# Patient Record
Sex: Female | Born: 1975 | Race: Black or African American | Hispanic: No | Smoking: Never smoker
Health system: Southern US, Community
[De-identification: ages and names within clinical notes are randomized; demographics above are authoritative.]

## PROBLEM LIST (undated history)

## (undated) DIAGNOSIS — R87629 Unspecified abnormal cytological findings in specimens from vagina: Secondary | ICD-10-CM

## (undated) DIAGNOSIS — N63 Unspecified lump in unspecified breast: Secondary | ICD-10-CM

## (undated) DIAGNOSIS — D509 Iron deficiency anemia, unspecified: Secondary | ICD-10-CM

## (undated) HISTORY — DX: Unspecified lump in unspecified breast: N63.0

## (undated) HISTORY — DX: Unspecified abnormal cytological findings in specimens from vagina: R87.629

## (undated) HISTORY — DX: Iron deficiency anemia, unspecified: D50.9

---

## 2000-07-18 HISTORY — PX: LEEP: SHX91

## 2014-12-01 ENCOUNTER — Other Ambulatory Visit: Payer: Self-pay | Admitting: Obstetrics and Gynecology

## 2014-12-02 ENCOUNTER — Other Ambulatory Visit: Payer: Self-pay | Admitting: Obstetrics and Gynecology

## 2014-12-02 DIAGNOSIS — N63 Unspecified lump in unspecified breast: Secondary | ICD-10-CM

## 2014-12-05 ENCOUNTER — Ambulatory Visit
Admission: RE | Admit: 2014-12-05 | Discharge: 2014-12-05 | Disposition: A | Discharge status: Home / Self Care | Payer: PRIVATE HEALTH INSURANCE | Source: Ambulatory Visit | Attending: Obstetrics and Gynecology | Admitting: Obstetrics and Gynecology

## 2014-12-05 DIAGNOSIS — N63 Unspecified lump in unspecified breast: Secondary | ICD-10-CM

## 2015-01-27 ENCOUNTER — Encounter: Payer: Self-pay | Admitting: *Deleted

## 2015-01-28 ENCOUNTER — Ambulatory Visit (INDEPENDENT_AMBULATORY_CARE_PROVIDER_SITE_OTHER): Payer: PRIVATE HEALTH INSURANCE | Admitting: Obstetrics and Gynecology

## 2015-01-28 ENCOUNTER — Encounter: Payer: Self-pay | Admitting: Obstetrics and Gynecology

## 2015-01-28 VITALS — BP 126/82 | HR 77 | Ht 64.0 in | Wt 145.0 lb

## 2015-01-28 DIAGNOSIS — N921 Excessive and frequent menstruation with irregular cycle: Secondary | ICD-10-CM | POA: Diagnosis not present

## 2015-01-28 DIAGNOSIS — Z975 Presence of (intrauterine) contraceptive device: Principal | ICD-10-CM

## 2015-01-28 NOTE — Progress Notes (Signed)
Subjective:     Patient ID: Nicole Roman, female   DOB: 1975-10-08, 39 y.o.   MRN: [id number]  HPI Mirena inserted 8 weeks ago without difficulty, has had daily bleeding since, changing panty liner at least twice a day. Denies pain or increased bleeding with sex; and denies any 'menstrual-like' bleeding.  Review of Systems See HPI    Objective:   Physical Exam A&O x 4 Well groomed lean AA female Pelvic exam: normal external genitalia, vulva, vagina, cervix, uterus and adnexa. Dark red scant blood noted at cervix with IUD string noted.    Assessment:     Breakthrough bleeding with Mirena     Plan:     Will continue with Mirena use, 1 pack LoLoEstrin given- to take 1 pill daily until bleeding stops.  RTC prn  Melody Trudee Kuster, CNM

## 2015-02-25 ENCOUNTER — Ambulatory Visit
Admission: RE | Admit: 2015-02-25 | Discharge: 2015-02-25 | Disposition: A | Discharge status: Home / Self Care | Payer: PRIVATE HEALTH INSURANCE | Source: Ambulatory Visit | Attending: Physician Assistant | Admitting: Physician Assistant

## 2015-02-25 ENCOUNTER — Other Ambulatory Visit: Payer: Self-pay | Admitting: Physician Assistant

## 2015-02-25 DIAGNOSIS — R7611 Nonspecific reaction to tuberculin skin test without active tuberculosis: Secondary | ICD-10-CM

## 2015-03-06 ENCOUNTER — Telehealth: Payer: Self-pay | Admitting: Family Medicine

## 2015-03-06 NOTE — Telephone Encounter (Signed)
Wants to [facility] the last time she had a ADDS TEST and others

## 2015-03-06 NOTE — Telephone Encounter (Signed)
She needs to have a Quantiferon Gold test, I will order it

## 2015-03-06 NOTE — Telephone Encounter (Signed)
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  The health department is wanting her to come in for screenings, but patient explained to them that because of where she grew up her test results always come back like this.  Pt is wanting Dr. Ancil Boozer advice as to what she should do.

## 2015-03-06 NOTE — Telephone Encounter (Signed)
She should follow up with the health department or come in, they may need to do a test called quantiferon gold. 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

## 2015-05-20 ENCOUNTER — Other Ambulatory Visit: Payer: Self-pay | Admitting: Family Medicine

## 2015-05-21 LAB — HEPATITIS B SURFACE ANTIBODY,QUALITATIVE: HEP B SURFACE AB, QUAL: NONREACTIVE

## 2015-05-21 LAB — CMP12+LP+TP+TSH+6AC+CBC/D/PLT
A/G RATIO: 1.6 (ref 1.1–2.5)
ALK PHOS: 92 IU/L (ref 39–117)
ALT: 6 IU/L (ref 0–32)
AST: 18 IU/L (ref 0–40)
Albumin: 4.3 g/dL (ref 3.5–5.5)
BUN / CREAT RATIO: 20 (ref 9–23)
BUN: 12 mg/dL (ref 6–24)
Basophils Absolute: 0 10*3/uL (ref 0.0–0.2)
Basos: 0 %
Bilirubin Total: 0.8 mg/dL (ref 0.0–1.2)
Calcium: 9.8 mg/dL (ref 8.7–10.2)
Chloride: 101 mmol/L (ref 97–106)
Chol/HDL Ratio: 3.2 ratio units (ref 0.0–4.4)
Cholesterol, Total: 155 mg/dL (ref 100–199)
Creatinine, Ser: 0.6 mg/dL (ref 0.57–1.00)
EOS (ABSOLUTE): 0.1 10*3/uL (ref 0.0–0.4)
EOS: 1 %
Estimated CHD Risk: <0.5 times avg. (ref 0.0–1.0)
Free Thyroxine Index: 2 (ref 1.2–4.9)
GFR calc Af Amer: 132 mL/min/{1.73_m2} (ref >59)
GFR calc non Af Amer: 114 mL/min/{1.73_m2} (ref >59)
GGT: 18 IU/L (ref 0–60)
Globulin, Total: 2.7 g/dL (ref 1.5–4.5)
Glucose: 85 mg/dL (ref 65–99)
HDL: 48 mg/dL (ref >39)
Hematocrit: 37.5 % (ref 34.0–46.6)
Hemoglobin: 12.1 g/dL (ref 11.1–15.9)
Immature Grans (Abs): 0 10*3/uL (ref 0.0–0.1)
Immature Granulocytes: 0 %
Iron: 55 ug/dL (ref 27–159)
LDH: 115 IU/L — AB (ref 119–226)
LDL Calculated: 99 mg/dL (ref 0–99)
Lymphocytes Absolute: 2.4 10*3/uL (ref 0.7–3.1)
Lymphs: 38 %
MCH: 28.7 pg (ref 26.6–33.0)
MCHC: 32.3 g/dL (ref 31.5–35.7)
MCV: 89 fL (ref 79–97)
MONOCYTES: 6 %
Monocytes Absolute: 0.4 10*3/uL (ref 0.1–0.9)
NEUTROS ABS: 3.3 10*3/uL (ref 1.4–7.0)
Neutrophils: 55 %
PHOSPHORUS: 3.7 mg/dL (ref 2.5–4.5)
POTASSIUM: 4.4 mmol/L (ref 3.5–5.2)
Platelets: 205 10*3/uL (ref 150–379)
RBC: 4.21 x10E6/uL (ref 3.77–5.28)
RDW: 14.2 % (ref 12.3–15.4)
Sodium: 138 mmol/L (ref 136–144)
T3 Uptake Ratio: 30 % (ref 24–39)
T4, Total: 6.6 ug/dL (ref 4.5–12.0)
TSH: 0.667 u[IU]/mL (ref 0.450–4.500)
Total Protein: 7 g/dL (ref 6.0–8.5)
Triglycerides: 41 mg/dL (ref 0–149)
URIC ACID: 4 mg/dL (ref 2.5–7.1)
VLDL Cholesterol Cal: 8 mg/dL (ref 5–40)
WBC: 6.2 10*3/uL (ref 3.4–10.8)

## 2015-07-15 ENCOUNTER — Ambulatory Visit: Payer: Self-pay | Admitting: Physician Assistant

## 2015-07-15 ENCOUNTER — Encounter: Payer: Self-pay | Admitting: Physician Assistant

## 2015-07-15 VITALS — BP 120/85 | HR 85 | Temp 98.7°F

## 2015-07-15 DIAGNOSIS — W57XXXA Bitten or stung by nonvenomous insect and other nonvenomous arthropods, initial encounter: Secondary | ICD-10-CM

## 2015-07-15 DIAGNOSIS — L03113 Cellulitis of right upper limb: Secondary | ICD-10-CM

## 2015-07-15 MED ORDER — SULFAMETHOXAZOLE-TRIMETHOPRIM 800-160 MG PO TABS: 1.0000 | ORAL_TABLET | Freq: Two times a day (BID) | ORAL | Status: DC

## 2015-07-15 MED ORDER — METHYLPREDNISOLONE 4 MG PO TBPK: ORAL_TABLET | ORAL | Status: DC

## 2015-07-15 NOTE — Progress Notes (Signed)
S: c/o r hand swelling, was a little swollen last night, used hydrocortisone cream, hand was swollen this am she couldn't make a fist, its a better now but still red and swollen, no known injury  O: vitals wnl, nad, skin on r hand is warm to touch, reddened, with streak going up r arm, swelling at hand on dorsal surface, 2 ?bug bites, full rom, n/v intact  A: insect bite, cellulitis  P: septra ds 1 po bid, medrol dose pack, otc benadryl, ice, return if worsening or not better in few days

## 2015-08-05 ENCOUNTER — Encounter: Payer: Self-pay | Admitting: Physician Assistant

## 2015-08-05 ENCOUNTER — Ambulatory Visit: Payer: Self-pay | Admitting: Physician Assistant

## 2015-08-05 ENCOUNTER — Other Ambulatory Visit: Payer: Self-pay | Admitting: Physician Assistant

## 2015-08-05 VITALS — BP 110/60 | HR 81 | Temp 98.9°F

## 2015-08-05 DIAGNOSIS — L03114 Cellulitis of left upper limb: Secondary | ICD-10-CM

## 2015-08-05 MED ORDER — METHYLPREDNISOLONE 4 MG PO TBPK: ORAL_TABLET | ORAL | Status: DC

## 2015-08-05 MED ORDER — SULFAMETHOXAZOLE-TRIMETHOPRIM 800-160 MG PO TABS: 1.0000 | ORAL_TABLET | Freq: Two times a day (BID) | ORAL | Status: DC

## 2015-08-05 NOTE — Progress Notes (Signed)
S: c/o red swollen spots on left arm, shoulder , r side of neck, unsure what is causing it, the last time had one on her hand, took septra and it cleared up, denies any iv use or injury/puncture wound to arm/hand, no fever/chills, has been eating a lot of shrimp lately, denies any hx of food allergies, no new products/foods/meds; has finished the tb meds she was taking for + tb test with neg cxr  O: vitals wnl, nad, left arm with 3 large warm spots, one on left shoulder near clavicle, one on r side of neck along cervical chain, lungs c t a, cv rrr  A: hives/cellulitis  P: septra ds, allergen food panels, cbc

## 2015-08-05 NOTE — Progress Notes (Signed)
Patient ID: Nicole Roman, female   DOB: [birth year], 40 y.o.   MRN: [id number] Patient called and expressed that she wants to take the steroid pack.  She wants it sent to [facility] on [address].

## 2015-08-05 NOTE — Addendum Note (Signed)
Addended by: Versie Starks on: 08/05/2015 02:30 PM   Modules accepted: Orders

## 2015-08-08 LAB — CBC WITH DIFFERENTIAL/PLATELET
BASOS: 0 %
Basophils Absolute: 0 10*3/uL (ref 0.0–0.2)
EOS (ABSOLUTE): 0 10*3/uL (ref 0.0–0.4)
EOS: 0 %
HEMOGLOBIN: 12 g/dL (ref 11.1–15.9)
Hematocrit: 36.5 % (ref 34.0–46.6)
IMMATURE GRANULOCYTES: 0 %
Immature Grans (Abs): 0 10*3/uL (ref 0.0–0.1)
LYMPHS: 40 %
Lymphocytes Absolute: 2.4 10*3/uL (ref 0.7–3.1)
MCH: 30 pg (ref 26.6–33.0)
MCHC: 32.9 g/dL (ref 31.5–35.7)
MCV: 91 fL (ref 79–97)
MONOCYTES: 6 %
MONOS ABS: 0.3 10*3/uL (ref 0.1–0.9)
NEUTROS PCT: 54 %
Neutrophils Absolute: 3.1 10*3/uL (ref 1.4–7.0)
Platelets: 182 10*3/uL (ref 150–379)
RBC: 4 x10E6/uL (ref 3.77–5.28)
RDW: 13.3 % (ref 12.3–15.4)
WBC: 5.8 10*3/uL (ref 3.4–10.8)

## 2015-08-08 LAB — ALLERGEN PROFILE, MINI-PANEL
Alternaria Alternata IgE: <0.1 kU/L
Cat Dander IgE: 0.14 kU/L — AB
D Farinae IgE: <0.1 kU/L
Kentucky Bluegrass IgE: <0.1 kU/L
Mouse Urine IgE: <0.1 kU/L
Oak, White IgE: <0.1 kU/L
Ragweed, Short IgE: <0.1 kU/L

## 2015-08-08 LAB — ALLERGEN PROFILE, BASIC
Codfish IgE: <0.1 kU/L
Egg White IgE: 0.28 kU/L — AB
Milk IgE: 0.45 kU/L — AB
Peanut IgE: <0.1 kU/L
Soybean IgE: <0.1 kU/L

## 2015-08-12 LAB — FX02-IGE FOOD MIX (SEAFOODS): Food Mix (Seafoods) IgE: NEGATIVE

## 2015-08-12 LAB — SPECIMEN STATUS REPORT

## 2015-08-13 NOTE — Progress Notes (Signed)
I spoke with the patient about her lab results and she expressed understanding. 

## 2015-10-01 ENCOUNTER — Encounter: Payer: Self-pay | Admitting: Physician Assistant

## 2015-10-01 ENCOUNTER — Ambulatory Visit: Payer: Self-pay | Admitting: Physician Assistant

## 2015-10-01 VITALS — BP 110/70 | HR 75 | Temp 98.5°F

## 2015-10-01 DIAGNOSIS — J029 Acute pharyngitis, unspecified: Secondary | ICD-10-CM

## 2015-10-01 MED ORDER — AZITHROMYCIN 250 MG PO TABS: ORAL_TABLET | ORAL | Status: DC

## 2015-10-01 NOTE — Progress Notes (Signed)
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

## 2015-10-27 ENCOUNTER — Other Ambulatory Visit: Payer: Self-pay | Admitting: Family Medicine

## 2015-10-27 DIAGNOSIS — Z1231 Encounter for screening mammogram for malignant neoplasm of breast: Secondary | ICD-10-CM

## 2015-12-07 ENCOUNTER — Ambulatory Visit
Admission: RE | Admit: 2015-12-07 | Discharge: 2015-12-07 | Disposition: A | Discharge status: Home / Self Care | Payer: Managed Care, Other (non HMO) | Source: Ambulatory Visit | Attending: Family Medicine | Admitting: Family Medicine

## 2015-12-07 DIAGNOSIS — Z1231 Encounter for screening mammogram for malignant neoplasm of breast: Secondary | ICD-10-CM | POA: Insufficient documentation

## 2015-12-16 ENCOUNTER — Other Ambulatory Visit: Payer: Self-pay | Admitting: Family Medicine

## 2015-12-16 DIAGNOSIS — R920 Mammographic microcalcification found on diagnostic imaging of breast: Secondary | ICD-10-CM

## 2015-12-25 ENCOUNTER — Ambulatory Visit: Payer: Self-pay

## 2015-12-25 ENCOUNTER — Ambulatory Visit
Admission: RE | Admit: 2015-12-25 | Discharge: 2015-12-25 | Disposition: A | Discharge status: Home / Self Care | Payer: Managed Care, Other (non HMO) | Source: Ambulatory Visit | Attending: Family Medicine | Admitting: Family Medicine

## 2015-12-25 DIAGNOSIS — R920 Mammographic microcalcification found on diagnostic imaging of breast: Secondary | ICD-10-CM | POA: Insufficient documentation

## 2016-02-03 ENCOUNTER — Ambulatory Visit: Payer: Self-pay | Admitting: Physician Assistant

## 2016-02-03 ENCOUNTER — Encounter: Payer: Self-pay | Admitting: Physician Assistant

## 2016-02-03 VITALS — BP 120/70 | HR 77 | Temp 98.2°F

## 2016-02-03 DIAGNOSIS — R1030 Lower abdominal pain, unspecified: Secondary | ICD-10-CM

## 2016-02-03 LAB — POCT URINALYSIS DIPSTICK
Bilirubin, UA: NEGATIVE
Glucose, UA: NEGATIVE
Ketones, UA: NEGATIVE
Leukocytes, UA: NEGATIVE
NITRITE UA: NEGATIVE
PH UA: 5.5
PROTEIN UA: NEGATIVE
RBC UA: NEGATIVE
Spec Grav, UA: >=1.03
Urobilinogen, UA: 1

## 2016-02-03 MED ORDER — FLUCONAZOLE 150 MG PO TABS: 150.0000 mg | ORAL_TABLET | Freq: Once | ORAL | Status: DC

## 2016-02-03 NOTE — Progress Notes (Signed)
   Subjective:    Patient ID: Nicole Roman, female    DOB: [birth year], 40 y.o.   MRN: [id number]  HPI  Patient c/o pelvic pain, dysuria, and whitish vaginal discharge. Denies flank pain or fever. Patient has IUD.  Review of Systems    Negative except for compliant Objective:   Physical Exam Deferred vaginal exam. Dip UA unremarkable.       Assessment & Plan:Yeat infection  Trial of Difulcan. Advised follow up with personal GYN Dcotor for further evaluation/treatment.

## 2016-02-04 MED ORDER — SULFAMETHOXAZOLE-TRIMETHOPRIM 800-160 MG PO TABS: 1.0000 | ORAL_TABLET | Freq: Two times a day (BID) | ORAL | Status: DC

## 2016-02-04 NOTE — Addendum Note (Signed)
Addended by: Sable Feil on: 02/04/2016 02:02 PM   Modules accepted: Orders, Medications

## 2016-02-04 NOTE — Addendum Note (Signed)
Addended by: Sable Feil on: 02/04/2016 01:08 PM   Modules accepted: Orders

## 2016-02-08 ENCOUNTER — Ambulatory Visit: Payer: Self-pay | Admitting: Physician Assistant

## 2016-02-08 ENCOUNTER — Other Ambulatory Visit: Payer: Self-pay | Admitting: Family Medicine

## 2016-02-08 ENCOUNTER — Ambulatory Visit (INDEPENDENT_AMBULATORY_CARE_PROVIDER_SITE_OTHER): Payer: Managed Care, Other (non HMO) | Admitting: Family Medicine

## 2016-02-08 ENCOUNTER — Encounter: Payer: Self-pay | Admitting: Family Medicine

## 2016-02-08 VITALS — BP 130/76 | HR 95 | Temp 99.2°F | Resp 18 | Ht 64.0 in | Wt 144.2 lb

## 2016-02-08 DIAGNOSIS — N898 Other specified noninflammatory disorders of vagina: Secondary | ICD-10-CM

## 2016-02-08 DIAGNOSIS — Z8742 Personal history of other diseases of the female genital tract: Secondary | ICD-10-CM | POA: Insufficient documentation

## 2016-02-08 DIAGNOSIS — T3695XA Adverse effect of unspecified systemic antibiotic, initial encounter: Secondary | ICD-10-CM

## 2016-02-08 DIAGNOSIS — Z124 Encounter for screening for malignant neoplasm of cervix: Secondary | ICD-10-CM

## 2016-02-08 DIAGNOSIS — B379 Candidiasis, unspecified: Secondary | ICD-10-CM

## 2016-02-08 DIAGNOSIS — N841 Polyp of cervix uteri: Secondary | ICD-10-CM | POA: Diagnosis not present

## 2016-02-08 DIAGNOSIS — Z113 Encounter for screening for infections with a predominantly sexual mode of transmission: Secondary | ICD-10-CM

## 2016-02-08 DIAGNOSIS — Z30431 Encounter for routine checking of intrauterine contraceptive device: Secondary | ICD-10-CM | POA: Diagnosis not present

## 2016-02-08 DIAGNOSIS — E049 Nontoxic goiter, unspecified: Secondary | ICD-10-CM

## 2016-02-08 DIAGNOSIS — Z299 Encounter for prophylactic measures, unspecified: Secondary | ICD-10-CM

## 2016-02-08 LAB — HIV ANTIBODY (ROUTINE TESTING W REFLEX): HIV 1&2 Ab, 4th Generation: NONREACTIVE

## 2016-02-08 MED ORDER — METRONIDAZOLE 500 MG PO TABS: 500.0000 mg | ORAL_TABLET | Freq: Two times a day (BID) | ORAL | 0 refills | Status: DC

## 2016-02-08 MED ORDER — FLUCONAZOLE 150 MG PO TABS: 150.0000 mg | ORAL_TABLET | ORAL | 0 refills | Status: DC

## 2016-02-08 NOTE — Progress Notes (Signed)
Name: Nicole Roman   MRN: [id number]    DOB: 1975-12-22   Date:02/08/2016       Progress Note  Subjective  Chief Complaint  Chief Complaint  Patient presents with  . Vaginal Discharge    [facility] and was given Diflucan which resolved some of the discharge but not of all of it. Patient still has some of the fishy odor when having intercoarse and white mucus discharge.    HPI  Vagina discharge: started one week after her cycle. LMP was 01/24/2016 and a few days later she noticed white discharge, thick, and had cramping with intercourse. She states that all the symptoms resolved, except for odor after intercourse. No fever, chills, she has same sexual partner for the past 18 months, she does not use condoms. She has previous history of abnormal pap smear, had a LEEP in 2002  Past Surgical History:  Procedure Laterality Date  . LEEP  2002    Family History  Problem Relation Age of Onset  . Cancer Mother     colon  . Diabetes Father   . Breast cancer Neg Hx     Social History   Social History  . Marital status: Legally Separated    Spouse name: N/A  . Number of children: N/A  . Years of education: N/A   Occupational History  . Not on file.   Social History Main Topics  . Smoking status: Never Smoker  . Smokeless tobacco: Never Used  . Alcohol use Yes     Comment: occasionally  . Drug use: No  . Sexual activity: Yes    Birth control/ protection: IUD   Other Topics Concern  . Not on file   Social History Narrative  . No narrative on file     Current Outpatient Prescriptions:  .  levonorgestrel (MIRENA) 20 MCG/24HR IUD, 1 each by Intrauterine route once., Disp: , Rfl:  .  fluconazole (DIFLUCAN) 150 MG tablet, Take 1 tablet (150 mg total) by mouth every other day., Disp: 3 tablet, Rfl: 0 .  metroNIDAZOLE (FLAGYL) 500 MG tablet, Take 1 tablet (500 mg total) by mouth 2 (two) times daily., Disp: 14 tablet, Rfl: 0  No Known  Allergies   ROS  Ten systems reviewed and is negative except as mentioned in HPI   Objective  Vitals:   02/08/16 0956  BP: 130/76  Pulse: 95  Resp: 18  Temp: 99.2 F (37.3 C)  TempSrc: Oral  SpO2: 97%  Weight: 144 lb 3.2 oz (65.4 kg)  Height: 5\' 4"  (1.626 m)    Body mass index is 24.75 kg/m.  Physical Exam  Constitutional: Patient appears well-developed and well-nourished.  No distress.  HEENT: head atraumatic, normocephalic, pupils equal and reactive to light, neck supple, throat within normal limits, goiter ( refuses to go back to Endo) Cardiovascular: Normal rate, regular rhythm and normal heart sounds.  No murmur heard. No BLE edema. Pulmonary/Chest: Effort normal and breath sounds normal. No respiratory distress. Abdominal: Soft.  There is no tenderness. GYN: mild vaginal discharge, positive for fishy odor, wet smear not done, sending pap and treating empirically with antibiotics. Small cervical polyp, friable cervix . IUD strings not seen during exam, negative for bimanual motion tenderness or masses.  Psychiatric: Patient has a normal mood and affect. behavior is normal. Judgment and thought content normal.  Recent Results (from the past 2160 hour(s))  POCT Urinalysis Dipstick (CPT 81002)     Status: Normal  Collection Time: 02/03/16 10:40 AM  Result Value Ref Range   Color, UA Yellow    Clarity, UA Clear    Glucose, UA Negative    Bilirubin, UA Negative    Ketones, UA Negative    Spec Grav, UA >=1.030    Blood, UA Negative    pH, UA 5.5    Protein, UA Negative    Urobilinogen, UA 1.0    Nitrite, UA Negative    Leukocytes, UA Negative Negative       Assessment & Plan  1. Vaginal discharge  - metroNIDAZOLE (FLAGYL) 500 MG tablet; Take 1 tablet (500 mg total) by mouth 2 (two) times daily.  Dispense: 14 tablet; Refill: 0  2. Routine screening for STI (sexually transmitted infection)  - HIV antibody - RPR  3. Cervical cancer screening  - Pap  IG, CT/NG NAA, and HPV (high risk)  4. Antibiotic-induced yeast infection  - fluconazole (DIFLUCAN) 150 MG tablet; Take 1 tablet (150 mg total) by mouth every other day.  Dispense: 3 tablet; Refill: 0  1. Vaginal discharge  - metroNIDAZOLE (FLAGYL) 500 MG tablet; Take 1 tablet (500 mg total) by mouth 2 (two) times daily.  Dispense: 14 tablet; Refill: 0   5. History of abnormal cervical Pap smear   6. Cervical polyp  - Ambulatory referral to Gynecology  7. IUD check up  - Ambulatory referral to Gynecology  8. Goiter  Seen by [facility] in the past, states negative biopsy, but lost to follow up one year later, explained importance of going back.

## 2016-02-08 NOTE — Progress Notes (Signed)
Patient came in to update her Tetanus shot per Dr Tobie Poet orders.  Tdap was administered.

## 2016-02-09 ENCOUNTER — Encounter: Payer: Self-pay | Admitting: Family Medicine

## 2016-02-09 ENCOUNTER — Encounter: Payer: Self-pay | Admitting: Obstetrics and Gynecology

## 2016-02-09 ENCOUNTER — Ambulatory Visit (INDEPENDENT_AMBULATORY_CARE_PROVIDER_SITE_OTHER): Payer: Managed Care, Other (non HMO) | Admitting: Obstetrics and Gynecology

## 2016-02-09 ENCOUNTER — Other Ambulatory Visit (INDEPENDENT_AMBULATORY_CARE_PROVIDER_SITE_OTHER): Payer: Managed Care, Other (non HMO)

## 2016-02-09 VITALS — BP 124/72 | HR 73 | Ht 64.0 in | Wt 147.6 lb

## 2016-02-09 DIAGNOSIS — Z30431 Encounter for routine checking of intrauterine contraceptive device: Secondary | ICD-10-CM | POA: Diagnosis not present

## 2016-02-09 DIAGNOSIS — N83201 Unspecified ovarian cyst, right side: Secondary | ICD-10-CM | POA: Insufficient documentation

## 2016-02-09 LAB — RPR

## 2016-02-09 NOTE — Progress Notes (Signed)
S: sent from PCP for IUD check as string was not visible on AE visit. Denies concerns.  O: A&O x4 Well groomed female in no distress Blood pressure 124/72, pulse 73, height 5\' 4"  (1.626 m), weight 147 lb 9.6 oz (67 kg), last menstrual period 01/18/2016. Pelvic exam: normal external genitalia, vulva, vagina, cervix, uterus and adnexa, IUD string not visible. See u/s below:  Indications:IUD placement Findings:  The uterus measures 8.7 x 5.2 x 7cm. Echo texture is homogenous without evidence of focal masses.  The Endometrium measures 4.3 mm.  IUD appears to be in the correct location.  Right Ovary measures 3.7 x 2 x 2.2 cm. Complex cyst measures 1.9 x 1.4 x 2.2cm. Left Ovary measures 3.1 x 1.6 x 2.4 cm. It is normal appearance. Survey of the adnexa demonstrates no adnexal masses. There is a trace amount free fluid in the cul de sac.  Impression: 1. Complex right ovarian cyst. IUD in proper place.    P: Desires to continue with IUD use, understands will have to search for string when it is time to remove it at 5 years. RTC prn.  Melody [place], CNM

## 2016-02-11 ENCOUNTER — Encounter: Payer: Self-pay | Admitting: Family Medicine

## 2016-02-11 DIAGNOSIS — IMO0002 Reserved for concepts with insufficient information to code with codable children: Secondary | ICD-10-CM | POA: Insufficient documentation

## 2016-02-11 LAB — PAP IG, CT-NG NAA, HPV HIGH-RISK
Chlamydia Probe Amp: NOT DETECTED
GC PROBE AMP: NOT DETECTED
HPV DNA HIGH RISK: NOT DETECTED

## 2016-02-24 ENCOUNTER — Telehealth: Payer: Self-pay | Admitting: Obstetrics and Gynecology

## 2016-02-24 NOTE — Telephone Encounter (Signed)
PT CALLED AND SHE HAD AN [place] DUE TO [facility] NOT FINDING THE STRING, SO [facility] ORDERED AN [place] TO MAKE SURE IUD WAS LOCATED PROPERLY, AND HER INSURANCE IS DENYING THE [place] THEY TOLD HER THAT [facility] NEEDS TO PUT IN A CORRECT CLAIM WITH A DIFFERENT CODE. ARE YOU ABLE TO DO THIS OR DOES IT NEED TO GO TO [facility] FIRST? PT WOULD LIKE CALL BACK WHEN FINISHED FILING

## 2016-03-02 ENCOUNTER — Ambulatory Visit: Payer: PRIVATE HEALTH INSURANCE | Admitting: Obstetrics and Gynecology

## 2016-04-05 ENCOUNTER — Ambulatory Visit: Payer: Self-pay | Admitting: Physician Assistant

## 2016-04-05 ENCOUNTER — Encounter: Payer: Self-pay | Admitting: Physician Assistant

## 2016-04-05 VITALS — BP 120/80 | HR 85 | Temp 99.1°F

## 2016-04-05 DIAGNOSIS — L309 Dermatitis, unspecified: Secondary | ICD-10-CM

## 2016-04-05 NOTE — Progress Notes (Signed)
S: states her left ear has been itching, using q tips and h202, no drainage from ear, no pain, just itching, nurse at work said it looked red  O: vitals wnl, nad, skin in left ear canal appears dry, pale, irritated, no redness swelling or exudate, neck supple no lymph, lungs c t a, cv rrr  A: eczema of ear  P: otc coconut oil to area of dryness, if not better in 5-7 d will call in cortisporin otic drops

## 2016-04-21 ENCOUNTER — Encounter: Payer: Self-pay | Admitting: Physician Assistant

## 2016-04-21 ENCOUNTER — Ambulatory Visit: Payer: Self-pay | Admitting: Physician Assistant

## 2016-04-21 VITALS — BP 109/68 | HR 84 | Temp 98.8°F

## 2016-04-21 DIAGNOSIS — H9202 Otalgia, left ear: Secondary | ICD-10-CM

## 2016-04-21 NOTE — Progress Notes (Signed)
   Subjective:Left ear pain    Patient ID: Nicole Roman, female    DOB: 1976-07-05, 40 y.o.   MRN: [id number]  HPI Left ear pain for one month. Refractory to conservative OTC treatment. Denies hearing loss.Denies headache or vertigo. Described the pain as "achy".   Review of Systems Negative except for compliant.    Objective:   Physical Exam HEENT:  Patient left ear canal is moist with baby oil. No Cerumen. Papular lesion posterior auricle.Neck supple without adenopathy. Lungs CTA, and Heart RRR.       Assessment & Plan:Otalgia left ear.  Conuslt to ENT for further evaluation and treatment.

## 2016-04-22 NOTE — Progress Notes (Signed)
Patient has been referred to [facility] to see Dr. Tami Ribas on 04/25/2016 at 2:30pm.  Patient has been notified.

## 2016-08-31 ENCOUNTER — Ambulatory Visit: Payer: Self-pay | Admitting: Physician Assistant

## 2016-08-31 ENCOUNTER — Encounter: Payer: Self-pay | Admitting: Physician Assistant

## 2016-08-31 VITALS — BP 135/76 | HR 74 | Temp 98.7°F | Ht 64.0 in | Wt 148.0 lb

## 2016-08-31 DIAGNOSIS — Z0000 Encounter for general adult medical examination without abnormal findings: Secondary | ICD-10-CM

## 2016-08-31 DIAGNOSIS — Z008 Encounter for other general examination: Secondary | ICD-10-CM

## 2016-08-31 DIAGNOSIS — Z0189 Encounter for other specified special examinations: Secondary | ICD-10-CM

## 2016-08-31 NOTE — Progress Notes (Signed)
S: here for biometrics and physical, no complaints, hx of anemia, takes iron supplements, nonsmoker, no drugs, occas etoh; fam hx dm, htn, colon CA; both parents still living; ros neg  O: vitals wnl, nad, ent wnl, neck supple no lymph, lungs c t a, cv rrr, abd soft nontender bs normal all 4 quads  A: well adult  P: labs today, exec panel, vit d, b12, ferritin, hiv, hep c; pt had mammogram in ?October, will let us know when she gets reminder for order to be placed

## 2016-08-31 NOTE — Progress Notes (Signed)
135/76  

## 2016-09-01 ENCOUNTER — Other Ambulatory Visit: Payer: Self-pay | Admitting: Physician Assistant

## 2016-09-01 LAB — CMP12+LP+TP+TSH+6AC+CBC/D/PLT
ALT: 8 IU/L (ref 0–32)
AST: 15 IU/L (ref 0–40)
Albumin/Globulin Ratio: 1.6 (ref 1.2–2.2)
Albumin: 4.5 g/dL (ref 3.5–5.5)
Alkaline Phosphatase: 88 IU/L (ref 39–117)
BASOS ABS: 0 10*3/uL (ref 0.0–0.2)
BUN/Creatinine Ratio: 14 (ref 9–23)
BUN: 8 mg/dL (ref 6–24)
Basos: 0 %
Bilirubin Total: 0.9 mg/dL (ref 0.0–1.2)
CHLORIDE: 100 mmol/L (ref 96–106)
CHOLESTEROL TOTAL: 150 mg/dL (ref 100–199)
Calcium: 9.6 mg/dL (ref 8.7–10.2)
Chol/HDL Ratio: 3.3 ratio units (ref 0.0–4.4)
Creatinine, Ser: 0.58 mg/dL (ref 0.57–1.00)
EOS (ABSOLUTE): 0.1 10*3/uL (ref 0.0–0.4)
Eos: 1 %
FREE THYROXINE INDEX: 2.2 (ref 1.2–4.9)
GFR calc Af Amer: 132 mL/min/{1.73_m2} (ref >59)
GFR, EST NON AFRICAN AMERICAN: 115 mL/min/{1.73_m2} (ref >59)
GGT: 25 IU/L (ref 0–60)
Globulin, Total: 2.8 g/dL (ref 1.5–4.5)
Glucose: 94 mg/dL (ref 65–99)
HDL: 45 mg/dL (ref >39)
HEMATOCRIT: 38.3 % (ref 34.0–46.6)
Hemoglobin: 12.5 g/dL (ref 11.1–15.9)
Immature Grans (Abs): 0 10*3/uL (ref 0.0–0.1)
Immature Granulocytes: 0 %
Iron: 68 ug/dL (ref 27–159)
LDH: 141 IU/L (ref 119–226)
LDL CALC: 99 mg/dL (ref 0–99)
Lymphocytes Absolute: 2.5 10*3/uL (ref 0.7–3.1)
Lymphs: 47 %
MCH: 28.6 pg (ref 26.6–33.0)
MCHC: 32.6 g/dL (ref 31.5–35.7)
MCV: 88 fL (ref 79–97)
MONOS ABS: 0.3 10*3/uL (ref 0.1–0.9)
Monocytes: 6 %
NEUTROS PCT: 46 %
Neutrophils Absolute: 2.4 10*3/uL (ref 1.4–7.0)
POTASSIUM: 4.3 mmol/L (ref 3.5–5.2)
Phosphorus: 3.3 mg/dL (ref 2.5–4.5)
Platelets: 233 10*3/uL (ref 150–379)
RBC: 4.37 x10E6/uL (ref 3.77–5.28)
RDW: 13.8 % (ref 12.3–15.4)
Sodium: 141 mmol/L (ref 134–144)
T3 Uptake Ratio: 29 % (ref 24–39)
T4, Total: 7.5 ug/dL (ref 4.5–12.0)
TSH: 1.15 u[IU]/mL (ref 0.450–4.500)
Total Protein: 7.3 g/dL (ref 6.0–8.5)
Triglycerides: 32 mg/dL (ref 0–149)
Uric Acid: 2.7 mg/dL (ref 2.5–7.1)
VLDL CHOLESTEROL CAL: 6 mg/dL (ref 5–40)
WBC: 5.3 10*3/uL (ref 3.4–10.8)

## 2016-09-01 LAB — HIV ANTIBODY (ROUTINE TESTING W REFLEX): HIV Screen 4th Generation wRfx: NONREACTIVE

## 2016-09-01 LAB — FERRITIN: FERRITIN: 23 ng/mL (ref 15–150)

## 2016-09-01 LAB — VITAMIN D 25 HYDROXY (VIT D DEFICIENCY, FRACTURES): VIT D 25 HYDROXY: 31.1 ng/mL (ref 30.0–100.0)

## 2016-09-01 LAB — HCV COMMENT:

## 2016-09-01 LAB — HEPATITIS C ANTIBODY (REFLEX): HCV Ab: <0.1 s/co ratio (ref 0.0–0.9)

## 2016-09-01 LAB — B12 AND FOLATE PANEL: FOLATE: 14.9 ng/mL (ref >3.0)

## 2016-09-01 MED ORDER — CYANOCOBALAMIN 1000 MCG/ML IJ SOLN: 1000.0000 ug | INTRAMUSCULAR | 1 refills | Status: DC

## 2016-09-01 NOTE — Progress Notes (Signed)
rx for b12 injection sent to pharmacy, will do weekly injections x 4 weeks then monthly injections

## 2016-09-08 ENCOUNTER — Ambulatory Visit: Payer: Self-pay | Admitting: Physician Assistant

## 2016-09-08 DIAGNOSIS — E538 Deficiency of other specified B group vitamins: Secondary | ICD-10-CM

## 2016-09-08 MED ORDER — CYANOCOBALAMIN 1000 MCG/ML IJ SOLN
1000.0000 ug | Freq: Once | INTRAMUSCULAR | Status: AC
  Administered 2016-09-08: 1000 ug via INTRAMUSCULAR

## 2016-09-08 NOTE — Progress Notes (Signed)
Pt here for rma to give b12 injection

## 2016-09-16 ENCOUNTER — Ambulatory Visit: Payer: Self-pay | Admitting: Physician Assistant

## 2016-09-16 ENCOUNTER — Encounter: Payer: Self-pay | Admitting: Physician Assistant

## 2016-09-16 VITALS — BP 113/70 | HR 79 | Temp 99.6°F

## 2016-09-16 DIAGNOSIS — E538 Deficiency of other specified B group vitamins: Secondary | ICD-10-CM

## 2016-09-16 DIAGNOSIS — J069 Acute upper respiratory infection, unspecified: Secondary | ICD-10-CM

## 2016-09-16 LAB — POCT INFLUENZA A/B
INFLUENZA B, POC: NEGATIVE
Influenza A, POC: NEGATIVE

## 2016-09-16 MED ORDER — FEXOFENADINE-PSEUDOEPHED ER 60-120 MG PO TB12: 1.0000 | ORAL_TABLET | Freq: Two times a day (BID) | ORAL | 0 refills | Status: DC

## 2016-09-16 MED ORDER — CYANOCOBALAMIN 1000 MCG/ML IJ SOLN
1000.0000 ug | Freq: Once | INTRAMUSCULAR | Status: AC
  Administered 2016-09-16: 1000 ug via INTRAMUSCULAR

## 2016-09-16 NOTE — Progress Notes (Addendum)
   Subjective:Seasonal allergies    Patient ID: Nicole Roman, female    DOB: 12/20/1975, 41 y.o.   MRN: [id number]  HPI Patient c/o watery eyes, nasal congestion, intermitting running nose, and post nasal drainage for 2 days. Also states low grade fever. Patient has not take flu shot this season.  History of seasonal rhinitis and re-started her Flonase yesterday.   Review of Systems    Allergic rhinitis. Objective:   Physical Exam Bilateral edematous nasal turbinates with clear rhinorrhea. Post nasal drainage. Neck supple w/o adenopathy. Lungs CTA and Heart RRR.       Assessment & Plan:Seasonal rhinitis  Continue p[revious medication and start Allergra-D as directed.  Follow up with PCP.

## 2016-09-23 ENCOUNTER — Ambulatory Visit: Payer: Self-pay | Admitting: Physician Assistant

## 2016-09-23 DIAGNOSIS — E538 Deficiency of other specified B group vitamins: Secondary | ICD-10-CM

## 2016-09-23 MED ORDER — CYANOCOBALAMIN 1000 MCG/ML IJ SOLN
1000.0000 ug | Freq: Once | INTRAMUSCULAR | Status: AC
  Administered 2016-09-23: 1000 ug via INTRAMUSCULAR

## 2016-09-23 NOTE — Progress Notes (Signed)
Here for b12 injection

## 2016-09-30 ENCOUNTER — Ambulatory Visit: Payer: Self-pay | Admitting: Physician Assistant

## 2016-09-30 DIAGNOSIS — E538 Deficiency of other specified B group vitamins: Secondary | ICD-10-CM

## 2016-09-30 MED ORDER — CYANOCOBALAMIN 1000 MCG/ML IJ SOLN
1000.0000 ug | Freq: Once | INTRAMUSCULAR | Status: AC
  Administered 2016-09-30: 1000 ug via INTRAMUSCULAR

## 2016-09-30 NOTE — Progress Notes (Signed)
Patient came in to have her scheduled b12 injection per Susan's authorization.

## 2016-10-06 ENCOUNTER — Other Ambulatory Visit: Payer: Self-pay | Admitting: Physician Assistant

## 2016-10-06 DIAGNOSIS — E538 Deficiency of other specified B group vitamins: Secondary | ICD-10-CM

## 2016-10-07 LAB — VITAMIN B12: Vitamin B-12: 607 pg/mL (ref 232–1245)

## 2016-11-07 ENCOUNTER — Ambulatory Visit: Payer: Self-pay | Admitting: Physician Assistant

## 2016-11-07 DIAGNOSIS — E538 Deficiency of other specified B group vitamins: Secondary | ICD-10-CM

## 2016-11-07 MED ORDER — CYANOCOBALAMIN 1000 MCG/ML IJ SOLN
1000.0000 ug | Freq: Once | INTRAMUSCULAR | Status: AC
  Administered 2016-11-07: 1000 ug via INTRAMUSCULAR

## 2016-11-07 NOTE — Progress Notes (Signed)
Pt here to get b12 injection, given by RMA

## 2016-11-23 ENCOUNTER — Telehealth: Payer: Self-pay | Admitting: Emergency Medicine

## 2016-11-23 DIAGNOSIS — Z299 Encounter for prophylactic measures, unspecified: Secondary | ICD-10-CM

## 2016-11-23 NOTE — Telephone Encounter (Signed)
Patient called and requested an order for her screening mammogram.  Patient expressed that she is not having any issues.

## 2016-11-23 NOTE — Telephone Encounter (Signed)
Mammogram ordered for pt, she called and said its been a year and needs the order to get her mammogram, no problems or changes with her breasts

## 2016-12-05 ENCOUNTER — Ambulatory Visit: Payer: Self-pay | Admitting: Physician Assistant

## 2016-12-05 DIAGNOSIS — E538 Deficiency of other specified B group vitamins: Secondary | ICD-10-CM

## 2016-12-05 MED ORDER — CYANOCOBALAMIN 1000 MCG/ML IJ SOLN
1000.0000 ug | Freq: Once | INTRAMUSCULAR | Status: AC
  Administered 2016-12-05: 1000 ug via INTRAMUSCULAR

## 2016-12-05 NOTE — Progress Notes (Signed)
Patient came in to have her scheduled B12 injection.

## 2016-12-07 ENCOUNTER — Ambulatory Visit: Payer: Self-pay

## 2016-12-28 ENCOUNTER — Ambulatory Visit: Payer: Self-pay | Admitting: Physician Assistant

## 2016-12-28 ENCOUNTER — Ambulatory Visit
Admission: RE | Admit: 2016-12-28 | Discharge: 2016-12-28 | Disposition: A | Discharge status: Home / Self Care | Payer: Managed Care, Other (non HMO) | Source: Ambulatory Visit | Attending: Physician Assistant | Admitting: Physician Assistant

## 2016-12-28 DIAGNOSIS — E538 Deficiency of other specified B group vitamins: Secondary | ICD-10-CM

## 2016-12-28 DIAGNOSIS — Z1231 Encounter for screening mammogram for malignant neoplasm of breast: Secondary | ICD-10-CM | POA: Insufficient documentation

## 2016-12-28 DIAGNOSIS — Z299 Encounter for prophylactic measures, unspecified: Secondary | ICD-10-CM

## 2016-12-28 MED ORDER — CYANOCOBALAMIN 1000 MCG/ML IJ SOLN
1000.0000 ug | Freq: Once | INTRAMUSCULAR | Status: AC
  Administered 2016-12-28: 1000 ug via INTRAMUSCULAR

## 2016-12-28 NOTE — Progress Notes (Signed)
Pt here for b12 injection, seen by rma

## 2016-12-30 ENCOUNTER — Ambulatory Visit: Payer: Self-pay

## 2017-01-30 ENCOUNTER — Other Ambulatory Visit: Payer: Self-pay

## 2017-01-31 ENCOUNTER — Ambulatory Visit: Payer: Self-pay | Admitting: Physician Assistant

## 2017-01-31 DIAGNOSIS — Z299 Encounter for prophylactic measures, unspecified: Secondary | ICD-10-CM

## 2017-01-31 MED ORDER — CYANOCOBALAMIN 1000 MCG/ML IJ SOLN
1000.0000 ug | Freq: Once | INTRAMUSCULAR | Status: AC
  Administered 2017-01-31: 1000 ug via INTRAMUSCULAR

## 2017-01-31 NOTE — Progress Notes (Signed)
Patient came in to have blood drawn for testing and to received her scheduled monthly b12 injection.

## 2017-02-01 LAB — VITAMIN B12: Vitamin B-12: 235 pg/mL (ref 232–1245)

## 2017-02-17 ENCOUNTER — Ambulatory Visit: Payer: Self-pay | Admitting: Physician Assistant

## 2017-02-17 ENCOUNTER — Encounter: Payer: Self-pay | Admitting: Physician Assistant

## 2017-02-17 VITALS — BP 134/68 | HR 85 | Temp 98.5°F | Resp 16

## 2017-02-17 DIAGNOSIS — L578 Other skin changes due to chronic exposure to nonionizing radiation: Secondary | ICD-10-CM

## 2017-02-17 MED ORDER — DEXAMETHASONE SODIUM PHOSPHATE 10 MG/ML IJ SOLN
10.0000 mg | Freq: Once | INTRAMUSCULAR | Status: AC
  Administered 2017-02-17: 10 mg via INTRAMUSCULAR

## 2017-02-17 NOTE — Progress Notes (Signed)
S: c/o rash on arms and legs, went to [place] for a long weekend, came home and noticed rash, did not wear sunscreen, no rash on face bc she wore a large hat, no pustules or drainage, used hydrocortisone cream which helped a little  O: vitals wnl, nad, skin with small raised bumps on arms and legs, no pustules or drainage, no vesicles, no burrows, n/v intact  A: dermatitis secondary to sun poisoning  P: decadron 10mg  im, continue hydrocortisone cream

## 2017-02-27 ENCOUNTER — Ambulatory Visit: Payer: Self-pay | Admitting: Physician Assistant

## 2017-02-27 DIAGNOSIS — E538 Deficiency of other specified B group vitamins: Secondary | ICD-10-CM

## 2017-02-27 MED ORDER — CYANOCOBALAMIN 1000 MCG/ML IJ SOLN
1000.0000 ug | Freq: Once | INTRAMUSCULAR | Status: AC
  Administered 2017-02-27: 1000 ug via INTRAMUSCULAR

## 2017-02-27 NOTE — Progress Notes (Signed)
Pt here for b12 injection only

## 2017-02-27 NOTE — Progress Notes (Signed)
7262 Cyanocobalamin 1000mg /ml given in Left deltoid IM. Lot [id number] exp 06/2018 MWoodardRN

## 2017-03-02 ENCOUNTER — Ambulatory Visit: Payer: Self-pay

## 2017-03-03 ENCOUNTER — Ambulatory Visit (INDEPENDENT_AMBULATORY_CARE_PROVIDER_SITE_OTHER): Payer: Managed Care, Other (non HMO) | Admitting: Certified Nurse Midwife

## 2017-03-03 ENCOUNTER — Encounter: Payer: Self-pay | Admitting: Certified Nurse Midwife

## 2017-03-03 VITALS — BP 130/77 | HR 76 | Wt 152.9 lb

## 2017-03-03 DIAGNOSIS — Z01818 Encounter for other preprocedural examination: Secondary | ICD-10-CM | POA: Diagnosis not present

## 2017-03-03 NOTE — Patient Instructions (Signed)
Fibrocystic Breast Changes  Fibrocystic breast changes are changes that can make your breasts swollen or painful. These changes happen when tiny sacs of fluid (cysts) form in the breast. This is a common condition. It does not mean that you have cancer. It usually happens because of hormone changes during a monthly period.  Follow these instructions at home:   Check your breasts after every monthly period. If you do not have monthly periods, check your breasts on the first day of every month. Check for:  ? Soreness.  ? New swelling or puffiness.  ? A change in breast size.  ? A change in a lump that was already there.   Take over-the-counter and prescription medicines only as told by your doctor.   Wear a support or sports bra that fits well. Wear this support especially when you are exercising.   Avoid or have less caffeine, fat, and sugar in what you eat and drink as told by your doctor.  Contact a doctor if:   You have fluid coming from your nipple, especially if the fluid has blood in it.   You have new lumps or bumps in your breast.   Your breast gets puffy, red, and painful.   You have changes in how your breast looks.   Your nipple looks flat or it sinks into your breast.  Get help right away if:   Your breast turns red, and the redness is spreading.  Summary   Fibrocystic breast changes are changes that can make your breasts swollen or painful.   This condition can happen when you have hormone changes during your monthly period.   With this condition, it is important to check your breasts after every monthly period. If you do not have monthly periods, check your breasts on the first day of every month.  This information is not intended to replace advice given to you by your health care provider. Make sure you discuss any questions you have with your health care provider.  Document Released: 06/16/2008 Document Revised: 03/17/2016 Document Reviewed: 03/17/2016  Elsevier Interactive Patient  Education  2017 [employer] Inc[employer]

## 2017-03-06 ENCOUNTER — Encounter: Payer: Self-pay | Admitting: Physician Assistant

## 2017-03-06 ENCOUNTER — Ambulatory Visit: Payer: Self-pay | Admitting: Physician Assistant

## 2017-03-06 VITALS — BP 138/90 | HR 75 | Temp 98.4°F

## 2017-03-06 DIAGNOSIS — K219 Gastro-esophageal reflux disease without esophagitis: Secondary | ICD-10-CM

## 2017-03-06 MED ORDER — OMEPRAZOLE 20 MG PO CPDR: 20.0000 mg | DELAYED_RELEASE_CAPSULE | Freq: Every day | ORAL | 3 refills | Status: DC

## 2017-03-06 NOTE — Progress Notes (Signed)
S: pt states her chest hurts when she eats or drinks, its a burning sensation, no sob, no diaphoresis, no cardiac type chest pain, sx for 1-2 d, does eat a lot of spicy foods  O: vitals wnl, nad, lungs c t a, cv rrr  A: gerd  P: prilosec 20mg  qd, if not better in a week will refer to gi

## 2017-03-12 NOTE — Progress Notes (Signed)
GYN ENCOUNTER NOTE  Subjective:       Nicole Roman is a 41 y.o. [phone] female here for pre-op evaluation for upcoming "Mommy makeover".   Patient is heading to [facility] for breast implants and tummy tuck or liposuction. She is doing this to make "herself happy".   Surgeon requests assessment by provider due to history of fibrocystic breast changes.   Denies difficulty breathing or respiratory distress, chest pain, abdominal pain, vaginal bleeding, and leg pain or swelling.    Gynecologic History  No LMP recorded. Patient is not currently having periods (Reason: IUD).  Contraception: IUD  Last Pap: 2017. Results were: ASCUS-HPV negative  Last mammogram: 12/28/2016. Results were: normal  Obstetric History  OB History  Gravida Para Term Preterm AB Living  3       1 2   SAB TAB Ectopic Multiple Live Births  1       2    # Outcome Date GA Lbr Len/2nd Weight Sex Delivery Anes PTL Lv  3 Gravida 2004    F Vag-Spont   LIV  2 Gravida 1996    M Vag-Spont   LIV  1 SAB               Past Medical History:  Diagnosis Date  . Breast lump   . Iron deficiency anemia   . Vaginal Pap smear, abnormal     Past Surgical History:  Procedure Laterality Date  . LEEP  2002    Current Outpatient Prescriptions on File Prior to Visit  Medication Sig Dispense Refill  . cyanocobalamin (,VITAMIN B-12,) 1000 MCG/ML injection Inject 1 mL (1,000 mcg total) into the muscle once a week. For 4 weeks then will do monthly 16 mL 1  . fexofenadine-pseudoephedrine (ALLEGRA-D) 60-120 MG 12 hr tablet Take 1 tablet by mouth 2 (two) times daily. 20 tablet 0  . levonorgestrel (MIRENA) 20 MCG/24HR IUD 1 each by Intrauterine route once.     No current facility-administered medications on file prior to visit.     No Known Allergies  Social History   Social History  . Marital status: Legally Separated    Spouse name: N/A  . Number of children: N/A  . Years of education: N/A   Occupational History  . Not  on file.   Social History Main Topics  . Smoking status: Never Smoker  . Smokeless tobacco: Never Used  . Alcohol use Yes     Comment: occasionally  . Drug use: No  . Sexual activity: Yes    Birth control/ protection: IUD   Other Topics Concern  . Not on file   Social History Narrative  . No narrative on file    Family History  Problem Relation Age of Onset  . Cancer Mother        colon  . Hypertension Mother   . Diabetes Father   . Breast cancer Neg Hx     The following portions of the patient's history were reviewed and updated as appropriate: allergies, current medications, past family history, past medical history, past social history, past surgical history and problem list.  Review of Systems  Review of Systems - Negative except as noted above.  History obtained from the patient.   Objective:   BP 130/77   Pulse 76   Wt 152 lb 14.4 oz (69.4 kg)   BMI 26.25 kg/m    CONSTITUTIONAL: Well-developed, well-nourished female in no acute distress.   HENT:  Normocephalic, atraumatic.   NECK: Normal  range of motion, supple, no masses.  Normal thyroid.   SKIN: Skin is warm and dry. No rash noted. Not diaphoretic. No erythema. No pallor.  [place]: Alert and oriented to person, place, and time.   PSYCHIATRIC: Normal mood and affect. Normal behavior. Normal judgment and thought content.  CARDIOVASCULAR: normal rate and regular rhythm.  RESPIRATORY: Normal respiratory effort, chest expands symmetrically. Lungs are clear to auscultation, no crackles or wheezes.  BREASTS: breasts appear normal, no suspicious masses, no skin or nipple changes or axillary nodes; fibrocystic breast changes present  ABDOMEN: Soft, non distended; Non tender.  No Organomegaly.  PELVIC: Deferred  Assessment:   1. Pre-op evaluation  Plan:   Patient cleared for "Mommy makeover".   Reviewed red flag symptoms and when to call.   RTC as needed.    Diona Fanti, CNM

## 2017-03-16 ENCOUNTER — Telehealth: Payer: Self-pay | Admitting: Emergency Medicine

## 2017-03-16 MED ORDER — FLUCONAZOLE 150 MG PO TABS: 150.0000 mg | ORAL_TABLET | Freq: Once | ORAL | 0 refills | Status: AC

## 2017-03-16 NOTE — Telephone Encounter (Signed)
Patient called and expressed that she has developed a yeast infection.

## 2017-03-16 NOTE — Telephone Encounter (Signed)
Pt called and asked if we could call in something for yeast, approved only for this one time.  Sent rx to [facility] dr

## 2017-03-22 ENCOUNTER — Encounter: Payer: Self-pay | Admitting: Family Medicine

## 2017-03-22 ENCOUNTER — Ambulatory Visit (INDEPENDENT_AMBULATORY_CARE_PROVIDER_SITE_OTHER): Payer: Managed Care, Other (non HMO) | Admitting: Family Medicine

## 2017-03-22 VITALS — BP 118/68 | HR 111 | Temp 99.1°F | Resp 16 | Ht 64.0 in | Wt 149.2 lb

## 2017-03-22 DIAGNOSIS — Z23 Encounter for immunization: Secondary | ICD-10-CM | POA: Diagnosis not present

## 2017-03-22 DIAGNOSIS — E049 Nontoxic goiter, unspecified: Secondary | ICD-10-CM | POA: Diagnosis not present

## 2017-03-22 DIAGNOSIS — Z131 Encounter for screening for diabetes mellitus: Secondary | ICD-10-CM | POA: Diagnosis not present

## 2017-03-22 DIAGNOSIS — N898 Other specified noninflammatory disorders of vagina: Secondary | ICD-10-CM | POA: Diagnosis not present

## 2017-03-22 MED ORDER — METRONIDAZOLE 500 MG PO TABS: 2000.0000 mg | ORAL_TABLET | Freq: Every day | ORAL | 0 refills | Status: DC

## 2017-03-22 NOTE — Progress Notes (Signed)
Name: Nicole Roman   MRN: [id number]    DOB: March 21, 1976   Date:03/22/2017       Progress Note  Subjective  Chief Complaint  Chief Complaint  Patient presents with  . Vaginitis    white discharge for about a week, itchy no burning pt also stated that she would like urine preg done today pt stated that she went to her employee clinic and was given one dose treatment for yeast infection a week ago but still having sypmtoms                                                                                                     . Procedure    pt having a surgery 07/28/2017 and would like for you to be the physician she sees for her follow up care     HPI  ASCUS and cervical polyp : found on exam last year, seen by [facility] [facility], advised to follow up with them  Vaginitis: she has noticed some vaginal itching and mild white discharge, thick, and with some fishy odor after intercourse going on for about one week. She went to employee health and was given Diflucan but not any better, no pelvic pain, has IUD and only spots occasionally break through bleeding.   Tummy tuck and breast reduction: she is going to [place] in January, advised to have surgery locally, I do no recommend not being able to follow up with surgeon, she wants to go to [facility] because it is cheaper.    Patient Active Problem List   Diagnosis Date Noted  . ASCUS favor benign 02/11/2016  . Ovarian cyst, right 02/09/2016  . History of abnormal cervical Pap smear 02/08/2016  . Goiter 02/08/2016    Past Surgical History:  Procedure Laterality Date  . LEEP  2002    Family History  Problem Relation Age of Onset  . Cancer Mother        colon  . Hypertension Mother   . Diabetes Father   . Breast cancer Neg Hx     Social History   Social History  . Marital status: Legally Separated    Spouse name: N/A  . Number of children: N/A  . Years of education: N/A   Occupational History  . Not on file.   Social History Main Topics  .  Smoking status: Never Smoker  . Smokeless tobacco: Never Used  . Alcohol use Yes     Comment: occasionally  . Drug use: No  . Sexual activity: Yes    Birth control/ protection: IUD   Other Topics Concern  . Not on file   Social History Narrative  . No narrative on file     Current Outpatient Prescriptions:  .  cyanocobalamin (,VITAMIN B-12,) 1000 MCG/ML injection, Inject 1 mL (1,000 mcg total) into the muscle once a week. For 4 weeks then will do monthly, Disp: 16 mL, Rfl: 1 .  fexofenadine-pseudoephedrine (ALLEGRA-D) 60-120 MG 12 hr tablet, Take 1 tablet by mouth 2 (two) times daily., Disp: 20 tablet, Rfl: 0 .  levonorgestrel (MIRENA) 20  MCG/24HR IUD, 1 each by Intrauterine route once., Disp: , Rfl:  .  metroNIDAZOLE (FLAGYL) 500 MG tablet, Take 4 tablets (2,000 mg total) by mouth daily., Disp: 4 tablet, Rfl: 0 .  omeprazole (PRILOSEC) 20 MG capsule, Take 1 capsule (20 mg total) by mouth daily. (Patient not taking: Reported on 03/22/2017), Disp: 30 capsule, Rfl: 3  No Known Allergies   ROS  Constitutional: Negative for fever or weight change.  Respiratory: Negative for cough and shortness of breath.   Cardiovascular: Negative for chest pain ( resolved with gerd medication) no palpitations.  Gastrointestinal: Negative for abdominal pain, no bowel changes.  Musculoskeletal: Negative for gait problem or joint swelling.  Skin: Negative for rash.  Neurological: Negative for dizziness or headache.  No other specific complaints in a complete review of systems (except as listed in HPI above).  Objective  Vitals:   03/22/17 1400  BP: 118/68  Pulse: (!) 111  Resp: 16  Temp: 99.1 F (37.3 C)  SpO2: 97%  Weight: 149 lb 3 oz (67.7 kg)  Height: 5\' 4"  (1.626 m)    Body mass index is 25.61 kg/m.  Physical Exam  Constitutional: Patient appears well-developed and well-nourished.  No distress.  HEENT: head atraumatic, normocephalic, pupils equal and reactive to light,neck mild  goiter, throat within normal limits Cardiovascular: Normal rate, regular rhythm and normal heart sounds.  No murmur heard. No BLE edema. Pulmonary/Chest: Effort normal and breath sounds normal. No respiratory distress. Abdominal: Soft.  There is no tenderness. GYN: normal cervix, no polyp, mild white discharge - no odor, normal bimanual exam, specimen sent to lab Psychiatric: Patient has a normal mood and affect. behavior is normal. Judgment and thought content normal.  Recent Results (from the past 2160 hour(s))  Vitamin B12     Status: None   Collection Time: 01/31/17 12:00 AM  Result Value Ref Range   Vitamin B-12 235 232 - 1,245 pg/mL      PHQ2/9: Depression screen PHQ 2/9 03/22/2017  Decreased Interest 0  Down, Depressed, Hopeless 0  PHQ - 2 Score 0     Fall Risk: Fall Risk  03/22/2017  Falls in the past year? No     Assessment & Plan  1. Goiter  Explained importance of following up with endo  - Thyroid Panel With TSH - referral endo   2. Vaginal discharge  Seen by Employee clinic and given diflucan last week, but not better we will try flagyl and send wet prep to the labs - WET PREP BY MOLECULAR PROBE - metroNIDAZOLE (FLAGYL) 500 MG tablet; Take 4 tablets (2,000 mg total) by mouth daily.  Dispense: 4 tablet; Refill: 0  3. Needs flu shot  refused  4. Diabetes mellitus screening  - Hemoglobin A1c

## 2017-03-23 LAB — HEMOGLOBIN A1C
HEMOGLOBIN A1C: 5.2 %{Hb} (ref <5.7)
MEAN PLASMA GLUCOSE: 103 (calc)
eAG (mmol/L): 5.7 (calc)

## 2017-03-23 LAB — WET PREP BY MOLECULAR PROBE
CANDIDA SPECIES: NOT DETECTED
MICRO NUMBER:: 80973055
SPECIMEN QUALITY:: ADEQUATE
Trichomonas vaginosis: NOT DETECTED

## 2017-03-23 LAB — THYROID PANEL WITH TSH
FREE THYROXINE INDEX: 2.7 (ref 1.4–3.8)
T3 Uptake: 35 % (ref 22–35)
T4 TOTAL: 7.8 ug/dL (ref 5.1–11.9)
TSH: 0.84 mIU/L

## 2017-03-31 ENCOUNTER — Ambulatory Visit: Payer: Self-pay | Admitting: Physician Assistant

## 2017-04-03 ENCOUNTER — Ambulatory Visit: Payer: Self-pay | Admitting: Physician Assistant

## 2017-04-03 DIAGNOSIS — E538 Deficiency of other specified B group vitamins: Secondary | ICD-10-CM

## 2017-04-03 MED ORDER — CYANOCOBALAMIN 1000 MCG/ML IJ SOLN
1000.0000 ug | Freq: Once | INTRAMUSCULAR | Status: AC
  Administered 2017-04-03: 1000 ug via INTRAMUSCULAR

## 2017-04-03 NOTE — Progress Notes (Signed)
Patient came in to get her scheduled b12 injection.

## 2017-04-07 ENCOUNTER — Telehealth: Payer: Self-pay | Admitting: Family Medicine

## 2017-04-07 ENCOUNTER — Other Ambulatory Visit: Payer: Self-pay | Admitting: Family Medicine

## 2017-04-07 NOTE — Telephone Encounter (Signed)
DR SOLUM AT [facility] WANTS TO KNOW WHAT IT IS EXACTLY FOR THIS PATIENT SINCE HER LABS ARE NORMAL. IS THERE A SPECIFIC NEED SINCE THERE IS NO NEED TO TREAT JUST FOR [place]. PLEASE ADVISE. NATE AT [phone]

## 2017-04-07 NOTE — Telephone Encounter (Signed)
Okay to cancel if Dr. Gabriel Carina does not think it is necessary

## 2017-04-07 NOTE — Telephone Encounter (Signed)
Nicole Roman states Dr. Gabriel Carina would just cancel this appointment since she did not see the need for this appointment.

## 2017-05-04 ENCOUNTER — Ambulatory Visit: Payer: Self-pay | Admitting: Physician Assistant

## 2017-05-04 DIAGNOSIS — Z299 Encounter for prophylactic measures, unspecified: Secondary | ICD-10-CM

## 2017-05-04 MED ORDER — CYANOCOBALAMIN 1000 MCG/ML IJ SOLN
1000.0000 ug | Freq: Once | INTRAMUSCULAR | Status: AC
  Administered 2017-05-04: 1000 ug via INTRAMUSCULAR

## 2017-05-04 NOTE — Progress Notes (Signed)
Patient came in to have her scheduled Vitamin B12 injection.

## 2017-06-01 ENCOUNTER — Ambulatory Visit: Payer: Self-pay | Admitting: Physician Assistant

## 2017-06-01 DIAGNOSIS — E538 Deficiency of other specified B group vitamins: Secondary | ICD-10-CM

## 2017-06-01 MED ORDER — CYANOCOBALAMIN 1000 MCG/ML IJ SOLN
1000.0000 ug | Freq: Once | INTRAMUSCULAR | Status: AC
  Administered 2017-06-01: 1000 ug via INTRAMUSCULAR

## 2017-06-01 NOTE — Progress Notes (Signed)
Pt here for b12 injection

## 2017-06-02 LAB — CBC WITH DIFFERENTIAL/PLATELET
BASOS ABS: 0 10*3/uL (ref 0.0–0.2)
BASOS: 0 %
EOS (ABSOLUTE): 0 10*3/uL (ref 0.0–0.4)
EOS: 1 %
Hematocrit: 38.9 % (ref 34.0–46.6)
Hemoglobin: 12.7 g/dL (ref 11.1–15.9)
IMMATURE GRANULOCYTES: 0 %
Immature Grans (Abs): 0 10*3/uL (ref 0.0–0.1)
LYMPHS ABS: 2.4 10*3/uL (ref 0.7–3.1)
Lymphs: 45 %
MCH: 28.7 pg (ref 26.6–33.0)
MCHC: 32.6 g/dL (ref 31.5–35.7)
MCV: 88 fL (ref 79–97)
Monocytes Absolute: 0.3 10*3/uL (ref 0.1–0.9)
Monocytes: 5 %
NEUTROS ABS: 2.6 10*3/uL (ref 1.4–7.0)
Neutrophils: 49 %
PLATELETS: 238 10*3/uL (ref 150–379)
RBC: 4.42 x10E6/uL (ref 3.77–5.28)
RDW: 14.1 % (ref 12.3–15.4)
WBC: 5.3 10*3/uL (ref 3.4–10.8)

## 2017-06-02 LAB — IRON: IRON: 45 ug/dL (ref 27–159)

## 2017-06-02 LAB — B12 AND FOLATE PANEL: VITAMIN B 12: 242 pg/mL (ref 232–1245)

## 2017-06-29 ENCOUNTER — Ambulatory Visit: Payer: Self-pay

## 2017-06-30 ENCOUNTER — Encounter: Payer: Self-pay | Admitting: Family Medicine

## 2017-06-30 ENCOUNTER — Ambulatory Visit: Payer: Managed Care, Other (non HMO) | Admitting: Family Medicine

## 2017-06-30 ENCOUNTER — Ambulatory Visit: Payer: Self-pay | Admitting: Emergency Medicine

## 2017-06-30 VITALS — BP 120/84 | HR 75 | Resp 14 | Ht 64.0 in | Wt 151.9 lb

## 2017-06-30 DIAGNOSIS — R87610 Atypical squamous cells of undetermined significance on cytologic smear of cervix (ASC-US): Secondary | ICD-10-CM

## 2017-06-30 DIAGNOSIS — E538 Deficiency of other specified B group vitamins: Secondary | ICD-10-CM

## 2017-06-30 DIAGNOSIS — E049 Nontoxic goiter, unspecified: Secondary | ICD-10-CM | POA: Diagnosis not present

## 2017-06-30 DIAGNOSIS — Z23 Encounter for immunization: Secondary | ICD-10-CM | POA: Diagnosis not present

## 2017-06-30 DIAGNOSIS — Z01818 Encounter for other preprocedural examination: Secondary | ICD-10-CM

## 2017-06-30 NOTE — Progress Notes (Signed)
Name: Nicole Roman   MRN: [id number]    DOB: Dec 21, 1975   Date:06/30/2017       Progress Note  Subjective  Chief Complaint  Chief Complaint  Patient presents with  . Pre-op Exam    HPI  Pre-op: she is going to have an abdominoplasty and breast augmentation with silicone done at [facility] in [place], [place]. Her surgeon will be Dr. Leroy Sea. Surgery is planned for 07/28/2017. She went to employee health today and had the requested labs and EKG done ( they will fax that to Dr. Aretha Parrot), patient came in for pre-op and to order abdominal US.  She denies abdominal pain or change in bowel movements. She has been feeling fine, she has never been under anesthesia. She denies family history of complications of surgeries. Labs done in August were reviewed. She denies chest pain, palpitation, decrease in exercise tolerance. She is young and healthy.   ASCUS: history of LEEP, needs to return for pap/cpe  B12 deficiency: getting monthly supplementation  Goiter: seen by [facility] in the past but did not follow up, we will re-evaluate on her CPE  Patient Active Problem List   Diagnosis Date Noted  . ASCUS favor benign 02/11/2016  . Ovarian cyst, right 02/09/2016  . History of abnormal cervical Pap smear 02/08/2016  . Goiter 02/08/2016    Past Surgical History:  Procedure Laterality Date  . LEEP  2002    Family History  Problem Relation Age of Onset  . Cancer Mother        colon  . Hypertension Mother   . Diabetes Father   . Breast cancer Neg Hx     Social History   Socioeconomic History  . Marital status: Legally Separated    Spouse name: Not on file  . Number of children: Not on file  . Years of education: Not on file  . Highest education level: Not on file  Social Needs  . Financial resource strain: Not on file  . Food insecurity - worry: Not on file  . Food insecurity - inability: Not on file  . Transportation needs - medical: Not on file  . Transportation needs -  non-medical: Not on file  Occupational History  . Not on file  Tobacco Use  . Smoking status: Never Smoker  . Smokeless tobacco: Never Used  Substance and Sexual Activity  . Alcohol use: Yes    Comment: occasionally  . Drug use: No  . Sexual activity: Yes    Birth control/protection: IUD  Other Topics Concern  . Not on file  Social History Narrative  . Not on file     Current Outpatient Medications:  .  cyanocobalamin (,VITAMIN B-12,) 1000 MCG/ML injection, Inject 1 mL (1,000 mcg total) into the muscle once a week. For 4 weeks then will do monthly, Disp: 16 mL, Rfl: 1 .  levonorgestrel (MIRENA) 20 MCG/24HR IUD, 1 each by Intrauterine route once., Disp: , Rfl:   No Known Allergies   ROS  Ten systems reviewed and is negative except as mentioned in HPI   Objective  Vitals:   06/30/17 1425  BP: 120/84  Pulse: 75  Resp: 14  SpO2: 98%  Weight: 151 lb 14.4 oz (68.9 kg)  Height: 5\' 4"  (1.626 m)    Body mass index is 26.07 kg/m.  Physical Exam  Constitutional: Patient appears well-developed and well-nourished.  No distress.  HEENT: head atraumatic, normocephalic, pupils equal and reactive to light, ears normal TM bilaterally,  neck supple,  throat within normal limits, mild thyromegaly  Cardiovascular: Normal rate, regular rhythm and normal heart sounds.  No murmur heard. No BLE edema. Pulmonary/Chest: Effort normal and breath sounds normal. No respiratory distress. Abdominal: Soft.  There is no tenderness., no masses, normal bowel sounds Psychiatric: Patient has a normal mood and affect. behavior is normal. Judgment and thought content normal.  Recent Results (from the past 2160 hour(s))  CBC with Diff     Status: None   Collection Time: 06/01/17  2:02 PM  Result Value Ref Range   WBC 5.3 3.4 - 10.8 x10E3/uL   RBC 4.42 3.77 - 5.28 x10E6/uL   Hemoglobin 12.7 11.1 - 15.9 g/dL   Hematocrit 38.9 34.0 - 46.6 %   MCV 88 79 - 97 fL   MCH 28.7 26.6 - 33.0 pg   MCHC 32.6  31.5 - 35.7 g/dL   RDW 14.1 12.3 - 15.4 %   Platelets 238 150 - 379 x10E3/uL   Neutrophils 49 Not Estab. %   Lymphs 45 Not Estab. %   Monocytes 5 Not Estab. %   Eos 1 Not Estab. %   Basos 0 Not Estab. %   Neutrophils Absolute 2.6 1.4 - 7.0 x10E3/uL   Lymphocytes Absolute 2.4 0.7 - 3.1 x10E3/uL   Monocytes Absolute 0.3 0.1 - 0.9 x10E3/uL   EOS (ABSOLUTE) 0.0 0.0 - 0.4 x10E3/uL   Basophils Absolute 0.0 0.0 - 0.2 x10E3/uL   Immature Granulocytes 0 Not Estab. %   Immature Grans (Abs) 0.0 0.0 - 0.1 x10E3/uL  B12 and Folate Panel     Status: None   Collection Time: 06/01/17  2:02 PM  Result Value Ref Range   Vitamin B-12 242 232 - 1,245 pg/mL   Folate >20.0 >3.0 ng/mL    Comment: A serum folate concentration of less than 3.1 ng/mL is considered to represent clinical deficiency.   Iron     Status: None   Collection Time: 06/01/17  2:02 PM  Result Value Ref Range   Iron 45 27 - 159 ug/dL     PHQ2/9: Depression screen PHQ 2/9 03/22/2017  Decreased Interest 0  Down, Depressed, Hopeless 0  PHQ - 2 Score 0     Fall Risk: Fall Risk  06/30/2017 03/22/2017  Falls in the past year? No No     Functional Status Survey: Is the patient deaf or have difficulty hearing?: No Does the patient have difficulty seeing, even when wearing glasses/contacts?: No Does the patient have difficulty concentrating, remembering, or making decisions?: No Does the patient have difficulty walking or climbing stairs?: No Does the patient have difficulty dressing or bathing?: No Does the patient have difficulty doing errands alone such as visiting a doctor's office or shopping?: No   Assessment & Plan  1. B12 deficiency  Getting B12 injections at employee clinic  2. Pre-op evaluation  Requested by surgeon  - US Abdomen Complete; Future Low risk , may proceed to surgery without further testing  3. ASCUS of cervix with negative high risk HPV  Needs to return for CPE and pap smear, went to employee  clinic 08/2016 but did not have pap repeated  4. Needs flu shot  Refused   5. Goiter  Mild enlargerment

## 2017-06-30 NOTE — Progress Notes (Signed)
Patient in office for labwk,EKG and B12 injection which was given IM right deltoid lot#

## 2017-07-01 LAB — CBC WITH DIFFERENTIAL/PLATELET
Basophils Absolute: 0 10*3/uL (ref 0.0–0.2)
Basos: 0 %
EOS (ABSOLUTE): 0 10*3/uL (ref 0.0–0.4)
EOS: 1 %
HEMATOCRIT: 38.1 % (ref 34.0–46.6)
Hemoglobin: 12.7 g/dL (ref 11.1–15.9)
IMMATURE GRANS (ABS): 0 10*3/uL (ref 0.0–0.1)
IMMATURE GRANULOCYTES: 0 %
LYMPHS: 45 %
Lymphocytes Absolute: 2.1 10*3/uL (ref 0.7–3.1)
MCH: 30.3 pg (ref 26.6–33.0)
MCHC: 33.3 g/dL (ref 31.5–35.7)
MCV: 91 fL (ref 79–97)
MONOS ABS: 0.3 10*3/uL (ref 0.1–0.9)
Monocytes: 7 %
NEUTROS ABS: 2.2 10*3/uL (ref 1.4–7.0)
NEUTROS PCT: 47 %
Platelets: 212 10*3/uL (ref 150–379)
RBC: 4.19 x10E6/uL (ref 3.77–5.28)
RDW: 13.9 % (ref 12.3–15.4)
WBC: 4.7 10*3/uL (ref 3.4–10.8)

## 2017-07-01 LAB — URINALYSIS, ROUTINE W REFLEX MICROSCOPIC
BILIRUBIN UA: NEGATIVE
Glucose, UA: NEGATIVE
Ketones, UA: NEGATIVE
Leukocytes, UA: NEGATIVE
Nitrite, UA: NEGATIVE
PH UA: 7 (ref 5.0–7.5)
Protein, UA: NEGATIVE
RBC UA: NEGATIVE
Specific Gravity, UA: 1.016 (ref 1.005–1.030)
UUROB: 1 mg/dL (ref 0.2–1.0)

## 2017-07-01 LAB — COMPREHENSIVE METABOLIC PANEL
A/G RATIO: 1.4 (ref 1.2–2.2)
ALT: 8 IU/L (ref 0–32)
AST: 17 IU/L (ref 0–40)
Albumin: 4.2 g/dL (ref 3.5–5.5)
Alkaline Phosphatase: 80 IU/L (ref 39–117)
BUN/Creatinine Ratio: 15 (ref 9–23)
BUN: 8 mg/dL (ref 6–24)
Bilirubin Total: 0.8 mg/dL (ref 0.0–1.2)
CALCIUM: 9.4 mg/dL (ref 8.7–10.2)
CO2: 23 mmol/L (ref 20–29)
CREATININE: 0.54 mg/dL — AB (ref 0.57–1.00)
Chloride: 103 mmol/L (ref 96–106)
GFR, EST AFRICAN AMERICAN: 135 mL/min/{1.73_m2} (ref >59)
GFR, EST NON AFRICAN AMERICAN: 117 mL/min/{1.73_m2} (ref >59)
GLOBULIN, TOTAL: 3.1 g/dL (ref 1.5–4.5)
Glucose: 82 mg/dL (ref 65–99)
POTASSIUM: 4.3 mmol/L (ref 3.5–5.2)
SODIUM: 138 mmol/L (ref 134–144)
TOTAL PROTEIN: 7.3 g/dL (ref 6.0–8.5)

## 2017-07-01 LAB — HCG, SERUM, QUALITATIVE: hCG,Beta Subunit,Qual,Serum: NEGATIVE m[IU]/mL (ref <6)

## 2017-07-01 LAB — PT AND PTT
INR: 1.1 (ref 0.8–1.2)
Prothrombin Time: 10.9 s (ref 9.1–12.0)
aPTT: 29 s (ref 24–33)

## 2017-07-03 ENCOUNTER — Telehealth: Payer: Self-pay | Admitting: Family Medicine

## 2017-07-03 MED ORDER — CYANOCOBALAMIN 1000 MCG/ML IJ SOLN
1000.0000 ug | Freq: Once | INTRAMUSCULAR | Status: AC
  Administered 2017-06-30: 1000 ug via INTRAMUSCULAR

## 2017-07-03 NOTE — Telephone Encounter (Signed)
Okay to give abdominal limit

## 2017-07-03 NOTE — Progress Notes (Signed)
Lab, EKG and mammogram results were faxed to Dr. Leroy Sea at [facility] per patient's request.

## 2017-07-03 NOTE — Telephone Encounter (Signed)
Copied from [place] [id number]. Topic: Quick Communication - See Telephone Encounter >> Jul 03, 2017 11:23 AM Boyd Kerbs wrote: CRM for notification. See Telephone encounter for:  Andrez [place] at [facility] [phone], asking for Verbal to Order (amend to order) to state [facility]. #[id number] . Because for a ventral hernia .  07/03/17.

## 2017-07-03 NOTE — Telephone Encounter (Signed)
Called [facility] and gave a verbal order for [facility]. She said she would change it and send it back to you to sign.

## 2017-07-04 ENCOUNTER — Ambulatory Visit: Payer: Managed Care, Other (non HMO)

## 2017-07-18 HISTORY — PX: AUGMENTATION MAMMAPLASTY: SUR837

## 2017-07-21 ENCOUNTER — Encounter: Payer: Self-pay | Admitting: Family Medicine

## 2017-07-21 ENCOUNTER — Ambulatory Visit: Payer: Managed Care, Other (non HMO) | Admitting: Family Medicine

## 2017-07-21 VITALS — BP 128/76 | HR 86 | Temp 98.1°F | Resp 18 | Ht 64.0 in | Wt 151.8 lb

## 2017-07-21 DIAGNOSIS — R030 Elevated blood-pressure reading, without diagnosis of hypertension: Secondary | ICD-10-CM | POA: Diagnosis not present

## 2017-07-21 DIAGNOSIS — R87610 Atypical squamous cells of undetermined significance on cytologic smear of cervix (ASC-US): Secondary | ICD-10-CM

## 2017-07-21 DIAGNOSIS — N898 Other specified noninflammatory disorders of vagina: Secondary | ICD-10-CM

## 2017-07-21 NOTE — Patient Instructions (Signed)
DASH Eating Plan DASH stands for "Dietary Approaches to Stop Hypertension." The DASH eating plan is a healthy eating plan that has been shown to reduce high blood pressure (hypertension). It may also reduce your risk for type 2 diabetes, heart disease, and stroke. The DASH eating plan may also help with weight loss. What are tips for following this plan? General guidelines  Avoid eating more than 2,300 mg (milligrams) of salt (sodium) a day. If you have hypertension, you may need to reduce your sodium intake to 1,500 mg a day.  Limit alcohol intake to no more than 1 drink a day for nonpregnant women and 2 drinks a day for men. One drink equals 12 oz of beer, 5 oz of wine, or 1 oz of hard liquor.  Work with your health care provider to maintain a healthy body weight or to lose weight. Ask what an ideal weight is for you.  Get at least 30 minutes of exercise that causes your heart to beat faster (aerobic exercise) most days of the week. Activities may include walking, swimming, or biking.  Work with your health care provider or diet and nutrition specialist (dietitian) to adjust your eating plan to your individual calorie needs. Reading food labels  Check food labels for the amount of sodium per serving. Choose foods with less than 5 percent of the Daily Value of sodium. Generally, foods with less than 300 mg of sodium per serving fit into this eating plan.  To find whole grains, look for the word "whole" as the first word in the ingredient list. Shopping  Buy products labeled as "low-sodium" or "no salt added."  Buy fresh foods. Avoid canned foods and premade or frozen meals. Cooking  Avoid adding salt when cooking. Use salt-free seasonings or herbs instead of table salt or sea salt. Check with your health care provider or pharmacist before using salt substitutes.  Do not fry foods. Cook foods using healthy methods such as baking, boiling, grilling, and broiling instead.  Cook with  heart-healthy oils, such as olive, canola, soybean, or sunflower oil. Meal planning   Eat a balanced diet that includes: ? 5 or more servings of fruits and vegetables each day. At each meal, try to fill half of your plate with fruits and vegetables. ? Up to 6-8 servings of whole grains each day. ? Less than 6 oz of lean meat, poultry, or fish each day. A 3-oz serving of meat is about the same size as a deck of cards. One egg equals 1 oz. ? 2 servings of low-fat dairy each day. ? A serving of nuts, seeds, or beans 5 times each week. ? Heart-healthy fats. Healthy fats called Omega-3 fatty acids are found in foods such as flaxseeds and coldwater fish, like sardines, salmon, and mackerel.  Limit how much you eat of the following: ? Canned or prepackaged foods. ? Food that is high in trans fat, such as fried foods. ? Food that is high in saturated fat, such as fatty meat. ? Sweets, desserts, sugary drinks, and other foods with added sugar. ? Full-fat dairy products.  Do not salt foods before eating.  Try to eat at least 2 vegetarian meals each week.  Eat more home-cooked food and less restaurant, buffet, and fast food.  When eating at a restaurant, ask that your food be prepared with less salt or no salt, if possible. What foods are recommended? The items listed may not be a complete list. Talk with your dietitian about what   dietary choices are best for you. Grains Whole-grain or whole-wheat bread. Whole-grain or whole-wheat pasta. Brown rice. Oatmeal. Quinoa. Bulgur. Whole-grain and low-sodium cereals. Pita bread. Low-fat, low-sodium crackers. Whole-wheat flour tortillas. Vegetables Fresh or frozen vegetables (raw, steamed, roasted, or grilled). Low-sodium or reduced-sodium tomato and vegetable juice. Low-sodium or reduced-sodium tomato sauce and tomato paste. Low-sodium or reduced-sodium canned vegetables. Fruits All fresh, dried, or frozen fruit. Canned fruit in natural juice (without  added sugar). Meat and other protein foods Skinless chicken or turkey. Ground chicken or turkey. Pork with fat trimmed off. Fish and seafood. Egg whites. Dried beans, peas, or lentils. Unsalted nuts, nut butters, and seeds. Unsalted canned beans. Lean cuts of beef with fat trimmed off. Low-sodium, lean deli meat. Dairy Low-fat (1%) or fat-free (skim) milk. Fat-free, low-fat, or reduced-fat cheeses. Nonfat, low-sodium ricotta or cottage cheese. Low-fat or nonfat yogurt. Low-fat, low-sodium cheese. Fats and oils Soft margarine without trans fats. Vegetable oil. Low-fat, reduced-fat, or light mayonnaise and salad dressings (reduced-sodium). Canola, safflower, olive, soybean, and sunflower oils. Avocado. Seasoning and other foods Herbs. Spices. Seasoning mixes without salt. Unsalted popcorn and pretzels. Fat-free sweets. What foods are not recommended? The items listed may not be a complete list. Talk with your dietitian about what dietary choices are best for you. Grains Baked goods made with fat, such as croissants, muffins, or some breads. Dry pasta or rice meal packs. Vegetables Creamed or fried vegetables. Vegetables in a cheese sauce. Regular canned vegetables (not low-sodium or reduced-sodium). Regular canned tomato sauce and paste (not low-sodium or reduced-sodium). Regular tomato and vegetable juice (not low-sodium or reduced-sodium). Pickles. Olives. Fruits Canned fruit in a light or heavy syrup. Fried fruit. Fruit in cream or butter sauce. Meat and other protein foods Fatty cuts of meat. Ribs. Fried meat. Bacon. Sausage. Bologna and other processed lunch meats. Salami. Fatback. Hotdogs. Bratwurst. Salted nuts and seeds. Canned beans with added salt. Canned or smoked fish. Whole eggs or egg yolks. Chicken or turkey with skin. Dairy Whole or 2% milk, cream, and half-and-half. Whole or full-fat cream cheese. Whole-fat or sweetened yogurt. Full-fat cheese. Nondairy creamers. Whipped toppings.  Processed cheese and cheese spreads. Fats and oils Butter. Stick margarine. Lard. Shortening. Ghee. Bacon fat. Tropical oils, such as coconut, palm kernel, or palm oil. Seasoning and other foods Salted popcorn and pretzels. Onion salt, garlic salt, seasoned salt, table salt, and sea salt. Worcestershire sauce. Tartar sauce. Barbecue sauce. Teriyaki sauce. Soy sauce, including reduced-sodium. Steak sauce. Canned and packaged gravies. Fish sauce. Oyster sauce. Cocktail sauce. Horseradish that you find on the shelf. Ketchup. Mustard. Meat flavorings and tenderizers. Bouillon cubes. Hot sauce and Tabasco sauce. Premade or packaged marinades. Premade or packaged taco seasonings. Relishes. Regular salad dressings. Where to find more information:  National Heart, Lung, and Blood Institute: www.nhlbi.nih.gov  American Heart Association: www.heart.org Summary  The DASH eating plan is a healthy eating plan that has been shown to reduce high blood pressure (hypertension). It may also reduce your risk for type 2 diabetes, heart disease, and stroke.  With the DASH eating plan, you should limit salt (sodium) intake to 2,300 mg a day. If you have hypertension, you may need to reduce your sodium intake to 1,500 mg a day.  When on the DASH eating plan, aim to eat more fresh fruits and vegetables, whole grains, lean proteins, low-fat dairy, and heart-healthy fats.  Work with your health care provider or diet and nutrition specialist (dietitian) to adjust your eating plan to your individual   calorie needs. This information is not intended to replace advice given to you by your health care provider. Make sure you discuss any questions you have with your health care provider. Document Released: 06/23/2011 Document Revised: 06/27/2016 Document Reviewed: 06/27/2016 Elsevier Interactive Patient Education  2018 [employer]  

## 2017-07-21 NOTE — Progress Notes (Signed)
Name: Nicole Roman   MRN: [id number]    DOB: Oct 03, 1975   Date:07/21/2017       Progress Note  Subjective  Chief Complaint  Chief Complaint  Patient presents with  . Hypertension    Patient has been nervous about a upcoming procedure having the "Mommy Makeover" surgery in [place].  BP at home has been running 150/90's  . Vaginal Itching    Onset-1 week, no odor but has had itching.    HPI   Elevated blood pressure: she was at work and felt some soreness on left arm, she told the med tech at work and he said it was related to her heart, she saw the nurse shortly after and her bp was elevated at 150/90's. No associated chest pain, palpitation, diaphoresis or SOB. She was given Ibuprofen and symptoms of arm pain resolved. She came in today to make sure everything is okay and bp is back to normal.   Vaginal itching: she just finished her cycle, LMP 07/17/2017 she states that she noticed vaginal itching and some vaginal discomfort during intercourse over the past week. Same sexual partner for the past 3 years, no history of STI. She took one dose of Diflucan without help and would like to be checked today  ASCUS; last done 2017, she had ASCUS HPV negative and since we will do a pelvic today we will recheck pap smear also  Patient Active Problem List   Diagnosis Date Noted  . ASCUS favor benign 02/11/2016  . Ovarian cyst, right 02/09/2016  . History of abnormal cervical Pap smear 02/08/2016  . Goiter 02/08/2016    Past Surgical History:  Procedure Laterality Date  . LEEP  2002    Family History  Problem Relation Age of Onset  . Cancer Mother        colon  . Hypertension Mother   . Diabetes Father   . Breast cancer Neg Hx     Social History   Socioeconomic History  . Marital status: Legally Separated    Spouse name: Not on file  . Number of children: Not on file  . Years of education: Not on file  . Highest education level: Not on file  Social Needs  . Financial resource  strain: Not on file  . Food insecurity - worry: Not on file  . Food insecurity - inability: Not on file  . Transportation needs - medical: Not on file  . Transportation needs - non-medical: Not on file  Occupational History  . Not on file  Tobacco Use  . Smoking status: Never Smoker  . Smokeless tobacco: Never Used  Substance and Sexual Activity  . Alcohol use: Yes    Comment: occasionally  . Drug use: No  . Sexual activity: Yes    Birth control/protection: IUD  Other Topics Concern  . Not on file  Social History Narrative  . Not on file     Current Outpatient Medications:  .  cyanocobalamin (,VITAMIN B-12,) 1000 MCG/ML injection, Inject 1 mL (1,000 mcg total) into the muscle once a week. For 4 weeks then will do monthly, Disp: 16 mL, Rfl: 1 .  levonorgestrel (MIRENA) 20 MCG/24HR IUD, 1 each by Intrauterine route once., Disp: , Rfl:   No Known Allergies   ROS  Ten systems reviewed and is negative except as mentioned in HPI   Objective  Vitals:   07/21/17 1502  BP: 128/76  Pulse: 86  Resp: 18  Temp: 98.1 F (36.7 C)  TempSrc:  Oral  SpO2: 98%  Weight: 151 lb 12.8 oz (68.9 kg)  Height: '5\' 4"'$  (1.626 m)    Body mass index is 26.06 kg/m.  Physical Exam  Constitutional: Patient appears well-developed and well-nourished.  No distress.  HEENT: head atraumatic, normocephalic, pupils equal and reactive to light neck supple, throat within normal limits Cardiovascular: Normal rate, regular rhythm and normal heart sounds.  No murmur heard. No BLE edema. Pulmonary/Chest: Effort normal and breath sounds normal. No respiratory distress. Abdominal: Soft.  There is no tenderness. Pelvic: IUD string not found, but had [place] last year that confirmed it was in place, no change in cycles, some old blood on oz, no lesions Psychiatric: Patient has a normal mood and affect. behavior is normal. Judgment and thought content normal.   Recent Results (from the past 2160 hour(s))  CBC  with Diff     Status: None   Collection Time: 06/01/17  2:02 PM  Result Value Ref Range   WBC 5.3 3.4 - 10.8 x10E3/uL   RBC 4.42 3.77 - 5.28 x10E6/uL   Hemoglobin 12.7 11.1 - 15.9 g/dL   Hematocrit 38.9 34.0 - 46.6 %   MCV 88 79 - 97 fL   MCH 28.7 26.6 - 33.0 pg   MCHC 32.6 31.5 - 35.7 g/dL   RDW 14.1 12.3 - 15.4 %   Platelets 238 150 - 379 x10E3/uL   Neutrophils 49 Not Estab. %   Lymphs 45 Not Estab. %   Monocytes 5 Not Estab. %   Eos 1 Not Estab. %   Basos 0 Not Estab. %   Neutrophils Absolute 2.6 1.4 - 7.0 x10E3/uL   Lymphocytes Absolute 2.4 0.7 - 3.1 x10E3/uL   Monocytes Absolute 0.3 0.1 - 0.9 x10E3/uL   EOS (ABSOLUTE) 0.0 0.0 - 0.4 x10E3/uL   Basophils Absolute 0.0 0.0 - 0.2 x10E3/uL   Immature Granulocytes 0 Not Estab. %   Immature Grans (Abs) 0.0 0.0 - 0.1 x10E3/uL  B12 and Folate Panel     Status: None   Collection Time: 06/01/17  2:02 PM  Result Value Ref Range   Vitamin B-12 242 232 - 1,245 pg/mL   Folate >20.0 >3.0 ng/mL    Comment: A serum folate concentration of less than 3.1 ng/mL is considered to represent clinical deficiency.   Iron     Status: None   Collection Time: 06/01/17  2:02 PM  Result Value Ref Range   Iron 45 27 - 159 ug/dL  Comp Met (CMET)     Status: Abnormal   Collection Time: 06/30/17  8:40 AM  Result Value Ref Range   Glucose 82 65 - 99 mg/dL   BUN 8 6 - 24 mg/dL   Creatinine, Ser 0.54 (L) 0.57 - 1.00 mg/dL   GFR calc non Af Amer 117 >59 mL/min/1.73   GFR calc Af Amer 135 >59 mL/min/1.73   BUN/Creatinine Ratio 15 9 - 23   Sodium 138 134 - 144 mmol/L   Potassium 4.3 3.5 - 5.2 mmol/L   Chloride 103 96 - 106 mmol/L   CO2 23 20 - 29 mmol/L   Calcium 9.4 8.7 - 10.2 mg/dL   Total Protein 7.3 6.0 - 8.5 g/dL   Albumin 4.2 3.5 - 5.5 g/dL   Globulin, Total 3.1 1.5 - 4.5 g/dL   Albumin/Globulin Ratio 1.4 1.2 - 2.2   Bilirubin Total 0.8 0.0 - 1.2 mg/dL   Alkaline Phosphatase 80 39 - 117 IU/L   AST 17 0 - 40 IU/L  ALT 8 0 - 32 IU/L  CBC  with Differential     Status: None   Collection Time: 06/30/17  8:40 AM  Result Value Ref Range   WBC 4.7 3.4 - 10.8 x10E3/uL   RBC 4.19 3.77 - 5.28 x10E6/uL   Hemoglobin 12.7 11.1 - 15.9 g/dL   Hematocrit 38.1 34.0 - 46.6 %   MCV 91 79 - 97 fL   MCH 30.3 26.6 - 33.0 pg   MCHC 33.3 31.5 - 35.7 g/dL   RDW 13.9 12.3 - 15.4 %   Platelets 212 150 - 379 x10E3/uL   Neutrophils 47 Not Estab. %   Lymphs 45 Not Estab. %   Monocytes 7 Not Estab. %   Eos 1 Not Estab. %   Basos 0 Not Estab. %   Neutrophils Absolute 2.2 1.4 - 7.0 x10E3/uL   Lymphocytes Absolute 2.1 0.7 - 3.1 x10E3/uL   Monocytes Absolute 0.3 0.1 - 0.9 x10E3/uL   EOS (ABSOLUTE) 0.0 0.0 - 0.4 x10E3/uL   Basophils Absolute 0.0 0.0 - 0.2 x10E3/uL   Immature Granulocytes 0 Not Estab. %   Immature Grans (Abs) 0.0 0.0 - 0.1 x10E3/uL  hCG, serum, qualitative     Status: None   Collection Time: 06/30/17  8:40 AM  Result Value Ref Range   hCG,Beta Subunit,Qual,Serum Negative Negative <6 mIU/mL  Urinalysis, Routine w reflex microscopic (not at [facility])     Status: None   Collection Time: 06/30/17  8:40 AM  Result Value Ref Range   Specific Gravity, UA 1.016 1.005 - 1.030   pH, UA 7.0 5.0 - 7.5   Color, UA Yellow Yellow   Appearance Ur Clear Clear   Leukocytes, UA Negative Negative   Protein, UA Negative Negative/Trace   Glucose, UA Negative Negative   Ketones, UA Negative Negative   RBC, UA Negative Negative   Bilirubin, UA Negative Negative   Urobilinogen, Ur 1.0 0.2 - 1.0 mg/dL   Nitrite, UA Negative Negative   Microscopic Examination Comment     Comment: Microscopic not indicated and not performed.  PT and PTT     Status: None   Collection Time: 06/30/17  8:40 AM  Result Value Ref Range   INR 1.1 0.8 - 1.2    Comment: Reference interval is for non-anticoagulated patients. Suggested INR therapeutic range for Vitamin K antagonist therapy:    Standard Dose (moderate intensity                   therapeutic range):       2.0  - 3.0    Higher intensity therapeutic range       2.5 - 3.5    Prothrombin Time 10.9 9.1 - 12.0 sec   aPTT 29 24 - 33 sec    Comment: This test has not been validated for monitoring unfractionated heparin therapy. aPTT-based therapeutic ranges for unfractionated heparin therapy have not been established. For general guidelines on Heparin monitoring, refer to the [employer].     PHQ2/9: Depression screen [facility] 2/9 07/21/2017 03/22/2017  Decreased Interest 0 0  Down, Depressed, Hopeless 0 0  PHQ - 2 Score 0 0     Fall Risk: Fall Risk  07/21/2017 06/30/2017 03/22/2017  Falls in the past year? No No No     Assessment & Plan  1. Elevated blood pressure reading  At work, resolved now, she is likely stressed about upcoming surgery   2. Vaginal itching  - WET PREP BY MOLECULAR  PROBE  3. ASCUS of cervix with negative high risk HPV  - Pap IG, CT/NG NAA, and HPV (high risk)

## 2017-07-22 LAB — WET PREP BY MOLECULAR PROBE
Candida species: NOT DETECTED
Gardnerella vaginalis: NOT DETECTED
MICRO NUMBER:: 90015979
SPECIMEN QUALITY: ADEQUATE
TRICHOMONAS VAG: NOT DETECTED

## 2017-07-25 LAB — PAP IG, CT-NG NAA, HPV HIGH-RISK
C. TRACHOMATIS RNA, TMA: NOT DETECTED
HPV DNA High Risk: NOT DETECTED
N. GONORRHOEAE RNA, TMA: NOT DETECTED

## 2017-07-30 HISTORY — PX: OTHER SURGICAL HISTORY: SHX169

## 2017-08-14 ENCOUNTER — Ambulatory Visit: Payer: Managed Care, Other (non HMO) | Admitting: Family Medicine

## 2017-10-04 ENCOUNTER — Ambulatory Visit: Payer: Self-pay | Admitting: Family Medicine

## 2017-10-04 ENCOUNTER — Encounter: Payer: Self-pay | Admitting: Family Medicine

## 2017-10-04 VITALS — BP 128/84 | HR 70 | Temp 98.5°F | Resp 16 | Ht 64.0 in | Wt 152.0 lb

## 2017-10-04 DIAGNOSIS — E538 Deficiency of other specified B group vitamins: Secondary | ICD-10-CM

## 2017-10-04 DIAGNOSIS — Z0189 Encounter for other specified special examinations: Principal | ICD-10-CM

## 2017-10-04 DIAGNOSIS — Z008 Encounter for other general examination: Secondary | ICD-10-CM

## 2017-10-04 LAB — GLUCOSE, POCT (MANUAL RESULT ENTRY): POC Glucose: 76 mg/dl (ref 70–99)

## 2017-10-04 MED ORDER — CYANOCOBALAMIN 1000 MCG/ML IJ SOLN: 1000.0000 ug | Freq: Once | INTRAMUSCULAR | Status: DC

## 2017-10-04 NOTE — Progress Notes (Signed)
Subjective: Annual biometrics screening  Patient presents for their annual biometric screening. Patient has a history of B12 deficiency, for which she receives monthly injections.  Patient reports recent cosmetic surgery, denies any postoperative complications.  No complaints or concerns at this time. PCP: Dr. Ancil Boozer Patient works at the [employer]. Patient denies any other issues or concerns.   Review of Systems Constitutional: Unremarkable.  HEENT: Denies dizziness, issues with hearing, vision problems.  Gastrointestinal: Denies issues with bowel or bladder.  Respiratory: Unremarkable.   Cardiovascular: Unremarkable.  Musculoskeletal: No significant arthralgias, myalgias, joint swelling, joint stiffness, back pain, neck pain. ROS otherwise negative.   Objective  Physical Exam General: Awake, alert and oriented. No acute distress. Well developed, hydrated and nourished. Appears stated age.  HEENT:  Supple neck without adenopathy. Sclera is non-icteric. The ear canal is clear without discharge. The tympanic membrane is normal in appearance with normal landmarks and cone of light. Nasal mucosa is pink and moist. Oral mucosa is pink and moist. The pharynx is normal in appearance without tonsillar swelling or exudates.  Skin: Skin in warm, dry and intact without rashes or lesions. Appropriate color for ethnicity. Cardiac: Heart rate and rhythm are normal. No murmurs, gallops, or rubs are auscultated.  Respiratory: The chest wall is symmetric and without deformity. No signs of respiratory distress. Lung sounds are clear in all lobes bilaterally without rales, ronchi, or wheezes.  Abdominal: Abdomen is soft, symmetric, and non-tender without distention. No masses, hepatomegaly, or splenomegaly are noted.  Spine: Neck and back are without deformity. Curvature of the cervical, thoracic, and lumbar spine are within normal limits. Posture is upright, gait is smooth, steady, and within normal  limits. No tenderness noted on palpation of the spinous processes. Spinous processes are midline. No discomfort is noted with flexion, extension, and side-to-side rotation of the cervical/thoracic/lumbar spine, full range of motion is noted. Grip strength is normal bilaterally.  Extremities: Full range of motion is noted to all major joints. Steady gait noted.  Neurological: The patient is awake, alert and oriented to person, place, and time with normal speech.  Memory is normal and thought processes intact. No gait abnormalities are appreciated.  Psychiatric: Appropriate mood and affect.   Assessment Annual biometrics screen  Plan  Labs pending. Encouraged routine visits with primary care provider.  Informed patient her B12 injections need to be managed/monitored by her PCP but that we can do the injections here. Fasting blood sugar is 76 today.

## 2017-10-04 NOTE — Progress Notes (Signed)
Gave B12 injection id LD  Lot: [id number] Exp: 01/2019 NDC: 15520-802-23

## 2017-10-05 LAB — LIPID PANEL
Chol/HDL Ratio: 3.4 ratio (ref 0.0–4.4)
Cholesterol, Total: 158 mg/dL (ref 100–199)
HDL: 47 mg/dL (ref >39)
LDL Calculated: 105 mg/dL — ABNORMAL HIGH (ref 0–99)
Triglycerides: 30 mg/dL (ref 0–149)
VLDL Cholesterol Cal: 6 mg/dL (ref 5–40)

## 2017-10-05 NOTE — Progress Notes (Signed)
Nicole Roman, I wanted to let you know that your blood work came back and looks great with the exception of your LDL cholesterol ("bad cholesterol"), which was slightly elevated at 105.  Normal values are between 0 and 99.  This is only a minor elevation but I wanted you to be aware of this to discuss this with your primary care provider at your next scheduled visit.

## 2017-11-06 ENCOUNTER — Other Ambulatory Visit: Payer: Self-pay

## 2017-11-06 DIAGNOSIS — E538 Deficiency of other specified B group vitamins: Secondary | ICD-10-CM

## 2017-11-06 MED ORDER — CYANOCOBALAMIN 1000 MCG/ML IJ SOLN
1000.0000 ug | Freq: Once | INTRAMUSCULAR | Status: AC
  Administered 2018-08-14: 1000 ug via INTRAMUSCULAR

## 2017-11-17 ENCOUNTER — Other Ambulatory Visit: Payer: Self-pay | Admitting: Family Medicine

## 2017-11-17 DIAGNOSIS — Z1231 Encounter for screening mammogram for malignant neoplasm of breast: Secondary | ICD-10-CM

## 2018-01-01 ENCOUNTER — Other Ambulatory Visit: Payer: Self-pay | Admitting: Family Medicine

## 2018-01-01 ENCOUNTER — Ambulatory Visit
Admission: RE | Admit: 2018-01-01 | Discharge: 2018-01-01 | Disposition: A | Discharge status: Home / Self Care | Payer: Managed Care, Other (non HMO) | Source: Ambulatory Visit | Attending: Family Medicine | Admitting: Family Medicine

## 2018-01-01 DIAGNOSIS — Z1231 Encounter for screening mammogram for malignant neoplasm of breast: Secondary | ICD-10-CM | POA: Diagnosis not present

## 2018-03-14 ENCOUNTER — Ambulatory Visit: Payer: Managed Care, Other (non HMO) | Admitting: Family Medicine

## 2018-03-14 ENCOUNTER — Encounter: Payer: Self-pay | Admitting: Family Medicine

## 2018-03-14 VITALS — BP 112/80 | HR 93 | Temp 99.1°F | Resp 16 | Ht 64.0 in | Wt 146.4 lb

## 2018-03-14 DIAGNOSIS — K130 Diseases of lips: Secondary | ICD-10-CM

## 2018-03-14 DIAGNOSIS — E538 Deficiency of other specified B group vitamins: Secondary | ICD-10-CM | POA: Diagnosis not present

## 2018-03-14 DIAGNOSIS — R21 Rash and other nonspecific skin eruption: Secondary | ICD-10-CM

## 2018-03-14 DIAGNOSIS — Z23 Encounter for immunization: Secondary | ICD-10-CM | POA: Diagnosis not present

## 2018-03-14 DIAGNOSIS — E049 Nontoxic goiter, unspecified: Secondary | ICD-10-CM

## 2018-03-14 DIAGNOSIS — L905 Scar conditions and fibrosis of skin: Secondary | ICD-10-CM

## 2018-03-14 MED ORDER — CYANOCOBALAMIN 1000 MCG/ML IJ SOLN: 1000.0000 ug | Freq: Once | INTRAMUSCULAR | 0 refills | Status: DC

## 2018-03-14 MED ORDER — WHITE PETROLATUM GEL: 1.0000 "application " | 0 refills | Status: DC | PRN

## 2018-03-14 MED ORDER — MEDERMA EX GEL: 1.0000 "application " | Freq: Every day | CUTANEOUS | 0 refills | Status: DC

## 2018-03-14 MED ORDER — CYANOCOBALAMIN 1000 MCG/ML IJ SOLN: 1000.0000 ug | Freq: Once | INTRAMUSCULAR | 0 refills | Status: AC

## 2018-03-14 MED ORDER — CYANOCOBALAMIN 1000 MCG/ML IJ SOLN
1000.0000 ug | Freq: Once | INTRAMUSCULAR | Status: AC
  Administered 2018-03-14: 1000 ug via INTRAMUSCULAR

## 2018-03-14 NOTE — Progress Notes (Signed)
Name: Nicole Roman   MRN: [id number]    DOB: 1975-12-16   Date:03/14/2018       Progress Note  Subjective  Chief Complaint  Chief Complaint  Patient presents with  . Rash    patient presents with a rash on the top of her right foot, left upper arm & lip. otc hydrocortisone cream  . Labs Only    b12  . Immunizations    declined influenza    HPI  B12 deficiency: she was getting B12 at employee clinic however they are no longer allowed to give to her. Last year B12 was in the 200's. She denies fatigue, tingling or numbness at this time. No history of anemia  Goiter: used to see endocrinologist, Dr. Cristino Martes at [facility] back in 2013, had biopsies done diagnosed with multinodular goiter, she denies hair loss, change in bowel movements, last thyroid panel was normal. She does not want to see endo at this time, we will order US thyroid today  Rash: she has noticed dry and chapped lips - no pain or bleeding, no blisters but she states feels weird on left upper lip. No previous history of herpes. She also had a rash on right foot and left arm, that was itchy and raised but resolved with topical hydrocortisone but now the are is darker than her skin.    Patient Active Problem List   Diagnosis Date Noted  . ASCUS favor benign 02/11/2016  . Ovarian cyst, right 02/09/2016  . History of abnormal cervical Pap smear 02/08/2016  . Goiter 02/08/2016    Past Surgical History:  Procedure Laterality Date  . 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 Aretha Parrot with [facility] in [place]  . LEEP  2002  . tummy tuck  07/30/2017   Dr. Aretha Parrot with [facility] in [facility]    Family History  Problem Relation Age of Onset  . Cancer Mother        colon  . Hypertension Mother   . Diabetes Father   . Breast cancer Neg Hx     Social History   Socioeconomic History  . Marital status: Divorced    Spouse name: Not on file  . Number of children: 2  . Years of education: Not on file  . Highest  education level: Some college, no degree  Occupational History  . Not on file  Social Needs  . Financial resource strain: Not hard at all  . Food insecurity:    Worry: Never true    Inability: Never true  . Transportation needs:    Medical: No    Non-medical: No  Tobacco Use  . Smoking status: Never Smoker  . Smokeless tobacco: Never Used  Substance and Sexual Activity  . Alcohol use: Yes    Comment: occasionally  . Drug use: No  . Sexual activity: Yes    Partners: Male    Birth control/protection: IUD  Lifestyle  . Physical activity:    Days per week: 3 days    Minutes per session: 90 min  . Stress: Not at all  Relationships  . Social connections:    Talks on phone: More than three times a week    Gets together: Twice a week    Attends religious service: 1 to 4 times per year    Active member of club or organization: No    Attends meetings of clubs or organizations: Never    Relationship status: Divorced  . Intimate partner violence:    Fear  of current or ex partner: No    Emotionally abused: No    Physically abused: No    Forced sexual activity: No  Other Topics Concern  . Not on file  Social History Narrative   Patient is originally from [place]     Current Outpatient Medications:  .  levonorgestrel (MIRENA) 20 MCG/24HR IUD, 1 each by Intrauterine route once., Disp: , Rfl:  .  cyanocobalamin (,VITAMIN B-12,) 1000 MCG/ML injection, Inject 1 mL (1,000 mcg total) into the muscle once for 1 dose., Disp: 1 mL, Rfl: 0 .  Scar Treatment Products (MEDERMA) GEL, Apply 1 application topically daily., Disp: 50 g, Rfl: 0 .  white petrolatum (VASELINE) GEL, Apply 1 application topically as needed for lip care., Disp: 16.8 g, Rfl: 0  Current Facility-Administered Medications:  .  cyanocobalamin ((VITAMIN B-12)) injection 1,000 mcg, 1,000 mcg, Intramuscular, Once, Fisher, Linden Dolin, PA-C  No Known Allergies   ROS  Ten systems reviewed and is negative except as mentioned  in HPI   Objective  Vitals:   03/14/18 1329  BP: 112/80  Pulse: 93  Resp: 16  Temp: 99.1 F (37.3 C)  TempSrc: Oral  SpO2: 99%  Weight: 146 lb 6.4 oz (66.4 kg)  Height: 5\' 4"  (1.626 m)    Body mass index is 25.13 kg/m.  Physical Exam  Constitutional: Patient appears well-developed and well-nourished. No distress.  HEENT: head atraumatic, normocephalic, pupils equal and reactive to light,  neck supple, throat within normal limits, mild thyroid enlargement  Cardiovascular: Normal rate, regular rhythm and normal heart sounds.  No murmur heard. No BLE edema. Pulmonary/Chest: Effort normal and breath sounds normal. No respiratory distress. Abdominal: Soft.  There is no tenderness. Skin: hyperpigmentation , round on right lateral foot and left upper arm, lip is dry, no redness.  Psychiatric: Patient has a normal mood and affect. behavior is normal. Judgment and thought content normal.  PHQ2/9: Depression screen [facility] 2/9 03/14/2018 07/21/2017 03/22/2017  Decreased Interest 0 0 0  Down, Depressed, Hopeless 0 0 0  PHQ - 2 Score 0 0 0  Altered sleeping 0 - -  Tired, decreased energy 0 - -  Change in appetite 0 - -  Feeling bad or failure about yourself  0 - -  Trouble concentrating 0 - -  Moving slowly or fidgety/restless 0 - -  Suicidal thoughts 0 - -  PHQ-9 Score 0 - -  Difficult doing work/chores Not difficult at all - -     Fall Risk: Fall Risk  03/14/2018 07/21/2017 06/30/2017 03/22/2017  Falls in the past year? No No No No      Functional Status Survey: Is the patient deaf or have difficulty hearing?: No Does the patient have difficulty seeing, even when wearing glasses/contacts?: Yes(patient stated that she wears reading glasses) Does the patient have difficulty concentrating, remembering, or making decisions?: No Does the patient have difficulty walking or climbing stairs?: No Does the patient have difficulty dressing or bathing?: No Does the patient have difficulty  doing errands alone such as visiting a doctor's office or shopping?: No   Assessment & Plan  1. B12 deficiency  - cyanocobalamin (,VITAMIN B-12,) 1000 MCG/ML injection; Inject 1 mL (1,000 mcg total) into the muscle once for 1 dose.  Dispense: 1 mL; Refill: 0 - Vitamin B12  2. Goiter  - US THYROID; Future  3. Needs flu shot  - Flu Vaccine QUAD 6+ mos PF IM (Fluarix Quad PF)  4. Rash   5.  Dry lips  - white petrolatum (VASELINE) GEL; Apply 1 application topically as needed for lip care.  Dispense: 16.8 g; Refill: 0  6. Scarring of skin  - Scar Treatment Products ([place]) GEL; Apply 1 application topically daily.  Dispense: 50 g; Refill: 0

## 2018-03-14 NOTE — Patient Instructions (Signed)
Try sublingual B12 1000 mcg daily if you don't want to return for B12 shots

## 2018-03-15 LAB — VITAMIN B12: Vitamin B-12: 260 pg/mL (ref 200–1100)

## 2018-03-21 ENCOUNTER — Ambulatory Visit: Payer: Managed Care, Other (non HMO)

## 2018-03-21 ENCOUNTER — Ambulatory Visit
Admission: RE | Admit: 2018-03-21 | Discharge: 2018-03-21 | Disposition: A | Discharge status: Home / Self Care | Payer: Managed Care, Other (non HMO) | Source: Ambulatory Visit | Attending: Family Medicine | Admitting: Family Medicine

## 2018-03-21 DIAGNOSIS — E049 Nontoxic goiter, unspecified: Secondary | ICD-10-CM | POA: Diagnosis present

## 2018-05-02 ENCOUNTER — Ambulatory Visit: Payer: Self-pay | Admitting: Emergency Medicine

## 2018-05-02 VITALS — BP 118/72 | HR 81 | Temp 98.7°F | Resp 14 | Ht 64.0 in | Wt 146.0 lb

## 2018-05-02 DIAGNOSIS — R300 Dysuria: Secondary | ICD-10-CM

## 2018-05-02 DIAGNOSIS — R21 Rash and other nonspecific skin eruption: Secondary | ICD-10-CM

## 2018-05-02 LAB — POCT URINALYSIS DIPSTICK
Bilirubin, UA: NEGATIVE
Blood, UA: NEGATIVE
Glucose, UA: NEGATIVE
KETONES UA: NEGATIVE
Nitrite, UA: NEGATIVE
PROTEIN UA: POSITIVE — AB
Spec Grav, UA: 1.02 (ref 1.010–1.025)
Urobilinogen, UA: 1 E.U./dL
pH, UA: 7 (ref 5.0–8.0)

## 2018-05-02 NOTE — Progress Notes (Signed)
Patient is here with 2 problems. Subjective. 1.  Patient has had a rash on her left upper arm and also on the dorsum of her right foot.  These areas are dark but the surrounding borders of these areas are extremely pruritic.  She has tried hydrocortisone cream without improvement. 2..  She also has had a thick whitish discharge and is concerned about a yeast infection and is asking about a Diflucan prescription.  She denies concerns regarding STDs and denies dysuria, frequency, or urgency. Objective. Examination of the left arm reveals a 2 cm area with 1 cm of hyperpigmentation surrounded by a raised red border. Examination of the dorsum of the right foot reveals a 2-1/2 cm area of hyperpigmentation.  There is 1/2 cm circular border around the hypopigmented area. UA trace blood with 1+ protein otherwise unremarkable. Assessment. I suspect the areas of rash are tenia corporis with hyperpigmentation from scratching the areas.  We will give a trial of a antifungal and if no improvement she will see the dermatologist.  I told her we did not have a microscope or ability to do a skin scraping and also that we did not have the ability to do evaluations for yeast infections and advised her to see her primary care physician for these issues. Plan Lotrimin cream to apply 3 times a day to the rash. GYN Lotrimin or Monistat for her yeast symptoms. If her rash is persistent after 1 week I advised her to see a PCP regarding referral to a dermatologist for evaluation.

## 2018-05-02 NOTE — Patient Instructions (Signed)
Apply Lotrimin AF cream to the rash on your right foot and left arm 3 times a day. Try GYN Lotrimin or Monistat vaginal cream for your symptoms of a yeast infection. If your rash on the upper arm and foot are persistent after 1 week please see your primary care provider to get a referral to a dermatologist.

## 2018-05-04 LAB — URINE CULTURE: ORGANISM ID, BACTERIA: NO GROWTH

## 2018-05-04 NOTE — Addendum Note (Signed)
Addended by: Judie Petit on: 05/04/2018 03:41 PM   Modules accepted: Level of Service

## 2018-05-04 NOTE — Progress Notes (Signed)
Called Patient for lab results and notified that her lab was negative. Patient understood that she needs to F/U with her PCP if still experiencing any issues. 05/04/18 Dirk Dress Ariannah Arenson CMA

## 2018-06-01 ENCOUNTER — Other Ambulatory Visit: Payer: Self-pay

## 2018-06-01 NOTE — Telephone Encounter (Signed)
Pt. Calling to see if we have her Hepatitis vaccinations on file. Also requesting Dr. Ancil Boozer send her Vit. B 12 to her [facility] on [address]

## 2018-06-03 MED ORDER — CYANOCOBALAMIN 1000 MCG/ML IJ SOLN: INTRAMUSCULAR | 0 refills | Status: DC

## 2018-06-05 ENCOUNTER — Telehealth: Payer: Self-pay | Admitting: Family Medicine

## 2018-06-05 ENCOUNTER — Encounter: Payer: Self-pay | Admitting: Nurse Practitioner

## 2018-06-05 ENCOUNTER — Ambulatory Visit: Payer: Managed Care, Other (non HMO) | Admitting: Nurse Practitioner

## 2018-06-05 ENCOUNTER — Other Ambulatory Visit (HOSPITAL_COMMUNITY)
Admission: RE | Admit: 2018-06-05 | Discharge: 2018-06-05 | Disposition: A | Discharge status: Home / Self Care | Payer: Managed Care, Other (non HMO) | Source: Ambulatory Visit | Attending: Nurse Practitioner | Admitting: Nurse Practitioner

## 2018-06-05 VITALS — BP 130/80 | HR 85 | Temp 98.5°F | Resp 16 | Ht 64.0 in | Wt 145.3 lb

## 2018-06-05 DIAGNOSIS — N898 Other specified noninflammatory disorders of vagina: Secondary | ICD-10-CM

## 2018-06-05 MED ORDER — CYANOCOBALAMIN 1000 MCG/ML IJ SOLN: INTRAMUSCULAR | 2 refills | Status: DC

## 2018-06-05 NOTE — Patient Instructions (Signed)

## 2018-06-05 NOTE — Telephone Encounter (Signed)
Copied from [place] [id number]. Topic: Quick Communication - See Telephone Encounter >> Jun 05, 2018  4:38 PM Sheran Luz wrote: CRM for notification. See Telephone encounter for: 06/05/18.  Patient calling to check lab results from 11.19- Please advise.

## 2018-06-05 NOTE — Progress Notes (Signed)
Name: Nicole Roman   MRN: [id number]    DOB: 1976-02-14   Date:06/05/2018       Progress Note  Subjective  Chief Complaint  Chief Complaint  Patient presents with  . Vaginitis    discharge and itching had 3 weeks - 1 month. treated it with Monistat and it came back.  . Medication Refill    Needs b-12 refilled. 90 day supply.    HPI  Patient endorses white, thick discharge and vaginal itching on and off for three weeks, resolved with monistat and then came back. Denies back pain, pelvic pain, dysuria.   No recent antibiotics, does not douche. No new sexual partners- does have unprotected sex in monogamous relationship. Declines other STD testing.  Patient Active Problem List   Diagnosis Date Noted  . ASCUS favor benign 02/11/2016  . Ovarian cyst, right 02/09/2016  . History of abnormal cervical Pap smear 02/08/2016  . Goiter 02/08/2016    Past Medical History:  Diagnosis Date  . Breast lump   . Iron deficiency anemia   . Vaginal Pap smear, abnormal     Past Surgical History:  Procedure Laterality Date  . 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 Aretha Parrot with [facility] in [place]  . LEEP  2002  . tummy tuck  07/30/2017   Dr. Aretha Parrot with [facility] in [place] History   Tobacco Use  . Smoking status: Never Smoker  . Smokeless tobacco: Never Used  Substance Use Topics  . Alcohol use: Yes    Comment: occasionally     Current Outpatient Medications:  .  cyanocobalamin (,VITAMIN B-12,) 1000 MCG/ML injection, INJECT 1 ML (1,000 MCG TOTAL) INTO THE MUSCLE ONCE FOR 1 DOSE., Disp: 1 mL, Rfl: 0 .  levonorgestrel (MIRENA) 20 MCG/24HR IUD, 1 each by Intrauterine route once., Disp: , Rfl:   Current Facility-Administered Medications:  .  cyanocobalamin ((VITAMIN B-12)) injection 1,000 mcg, 1,000 mcg, Intramuscular, Once, Fisher, Susan W, PA-C  No Known Allergies  ROS  No other specific complaints in a complete review of systems (except as listed  in HPI above).  Objective  Vitals:   06/05/18 1112  BP: 130/80  Pulse: 85  Resp: 16  Temp: 98.5 F (36.9 C)  TempSrc: Oral  SpO2: 99%  Weight: 145 lb 4.8 oz (65.9 kg)  Height: 5\' 4"  (1.626 m)    Body mass index is 24.94 kg/m.  Nursing Note and Vital Signs reviewed.  Physical Exam  Constitutional: She is oriented to person, place, and time. She appears well-developed and well-nourished. She is cooperative.  HENT:  Head: Normocephalic and atraumatic.  Right Ear: Hearing normal.  Left Ear: Hearing normal.  Mouth/Throat: Mucous membranes are normal.  Eyes: Conjunctivae are normal.  Cardiovascular: Normal rate, regular rhythm and normal heart sounds.  Pulmonary/Chest: Effort normal and breath sounds normal.  Abdominal: Soft. Normal appearance and bowel sounds are normal. There is no tenderness.  Musculoskeletal: Normal range of motion.  Neurological: She is alert and oriented to person, place, and time.  Psychiatric: She has a normal mood and affect. Her speech is normal and behavior is normal. Judgment and thought content normal.      No results found for this or any previous visit (from the past 48 hour(s)).  Assessment & Plan  1. Vaginal discharge - Cervicovaginal ancillary only  2. Vaginal itching - Cervicovaginal ancillary only .

## 2018-06-06 ENCOUNTER — Other Ambulatory Visit: Payer: Self-pay | Admitting: Nurse Practitioner

## 2018-06-06 DIAGNOSIS — B379 Candidiasis, unspecified: Secondary | ICD-10-CM

## 2018-06-06 LAB — CERVICOVAGINAL ANCILLARY ONLY
BACTERIAL VAGINITIS: NEGATIVE
Candida vaginitis: POSITIVE — AB
Chlamydia: NEGATIVE
Neisseria Gonorrhea: NEGATIVE
Trichomonas: NEGATIVE

## 2018-06-06 MED ORDER — FLUCONAZOLE 150 MG PO TABS: 150.0000 mg | ORAL_TABLET | Freq: Once | ORAL | 0 refills | Status: DC

## 2018-07-03 ENCOUNTER — Ambulatory Visit: Payer: Managed Care, Other (non HMO) | Admitting: Family Medicine

## 2018-08-11 LAB — LIPID PANEL
Cholesterol: 142 (ref 0–200)
HDL: 52 (ref 35–70)
LDL Cholesterol: 83
Triglycerides: 36 — AB (ref 40–160)

## 2018-08-11 LAB — TSH: TSH: 0.6 (ref 0.41–5.90)

## 2018-08-11 LAB — BASIC METABOLIC PANEL: Glucose: 84

## 2018-08-14 ENCOUNTER — Other Ambulatory Visit (HOSPITAL_COMMUNITY)
Admission: RE | Admit: 2018-08-14 | Discharge: 2018-08-14 | Disposition: A | Discharge status: Home / Self Care | Payer: Managed Care, Other (non HMO) | Source: Ambulatory Visit | Attending: Family Medicine | Admitting: Family Medicine

## 2018-08-14 ENCOUNTER — Encounter: Payer: Self-pay | Admitting: Family Medicine

## 2018-08-14 ENCOUNTER — Ambulatory Visit (INDEPENDENT_AMBULATORY_CARE_PROVIDER_SITE_OTHER): Payer: Managed Care, Other (non HMO) | Admitting: Family Medicine

## 2018-08-14 VITALS — BP 130/90 | HR 87 | Temp 99.8°F | Resp 12 | Ht 64.0 in | Wt 144.9 lb

## 2018-08-14 DIAGNOSIS — Z01419 Encounter for gynecological examination (general) (routine) without abnormal findings: Secondary | ICD-10-CM | POA: Diagnosis not present

## 2018-08-14 DIAGNOSIS — E049 Nontoxic goiter, unspecified: Secondary | ICD-10-CM | POA: Diagnosis not present

## 2018-08-14 DIAGNOSIS — Z124 Encounter for screening for malignant neoplasm of cervix: Secondary | ICD-10-CM | POA: Diagnosis present

## 2018-08-14 DIAGNOSIS — Z1239 Encounter for other screening for malignant neoplasm of breast: Secondary | ICD-10-CM

## 2018-08-14 DIAGNOSIS — Z113 Encounter for screening for infections with a predominantly sexual mode of transmission: Secondary | ICD-10-CM | POA: Diagnosis not present

## 2018-08-14 DIAGNOSIS — R21 Rash and other nonspecific skin eruption: Secondary | ICD-10-CM

## 2018-08-14 DIAGNOSIS — E538 Deficiency of other specified B group vitamins: Secondary | ICD-10-CM

## 2018-08-14 DIAGNOSIS — R87610 Atypical squamous cells of undetermined significance on cytologic smear of cervix (ASC-US): Secondary | ICD-10-CM | POA: Insufficient documentation

## 2018-08-14 MED ORDER — CYANOCOBALAMIN 1000 MCG/ML IJ SOLN: INTRAMUSCULAR | 5 refills | Status: DC

## 2018-08-14 NOTE — Patient Instructions (Signed)
Preventive Care 40-64 Years, Female Preventive care refers to lifestyle choices and visits with your health care provider that can promote health and wellness. What does preventive care include?   A yearly physical exam. This is also called an annual well check.  Dental exams once or twice a year.  Routine eye exams. Ask your health care provider how often you should have your eyes checked.  Personal lifestyle choices, including: ? Daily care of your teeth and gums. ? Regular physical activity. ? Eating a healthy diet. ? Avoiding tobacco and drug use. ? Limiting alcohol use. ? Practicing safe sex. ? Taking low-dose aspirin daily starting at age 50. ? Taking vitamin and mineral supplements as recommended by your health care provider. What happens during an annual well check? The services and screenings done by your health care provider during your annual well check will depend on your age, overall health, lifestyle risk factors, and family history of disease. Counseling Your health care provider may ask you questions about your:  Alcohol use.  Tobacco use.  Drug use.  Emotional well-being.  Home and relationship well-being.  Sexual activity.  Eating habits.  Work and work environment.  Method of birth control.  Menstrual cycle.  Pregnancy history. Screening You may have the following tests or measurements:  Height, weight, and BMI.  Blood pressure.  Lipid and cholesterol levels. These may be checked every 5 years, or more frequently if you are over 50 years old.  Skin check.  Lung cancer screening. You may have this screening every year starting at age 55 if you have a 30-pack-year history of smoking and currently smoke or have quit within the past 15 years.  Colorectal cancer screening. All adults should have this screening starting at age 50 and continuing until age 75. Your health care provider may recommend screening at age 45. You will have tests every  1-10 years, depending on your results and the type of screening test. People at increased risk should start screening at an earlier age. Screening tests may include: ? Guaiac-based fecal occult blood testing. ? Fecal immunochemical test (FIT). ? Stool DNA test. ? Virtual colonoscopy. ? Sigmoidoscopy. During this test, a flexible tube with a tiny camera (sigmoidoscope) is used to examine your rectum and lower colon. The sigmoidoscope is inserted through your anus into your rectum and lower colon. ? Colonoscopy. During this test, a long, thin, flexible tube with a tiny camera (colonoscope) is used to examine your entire colon and rectum.  Hepatitis C blood test.  Hepatitis B blood test.  Sexually transmitted disease (STD) testing.  Diabetes screening. This is done by checking your blood sugar (glucose) after you have not eaten for a while (fasting). You may have this done every 1-3 years.  Mammogram. This may be done every 1-2 years. Talk to your health care provider about when you should start having regular mammograms. This may depend on whether you have a family history of breast cancer.  BRCA-related cancer screening. This may be done if you have a family history of breast, ovarian, tubal, or peritoneal cancers.  Pelvic exam and Pap test. This may be done every 3 years starting at age 21. Starting at age 30, this may be done every 5 years if you have a Pap test in combination with an HPV test.  Bone density scan. This is done to screen for osteoporosis. You may have this scan if you are at high risk for osteoporosis. Discuss your test results, treatment options,   and if necessary, the need for more tests with your health care provider. Vaccines Your health care provider may recommend certain vaccines, such as:  Influenza vaccine. This is recommended every year.  Tetanus, diphtheria, and acellular pertussis (Tdap, Td) vaccine. You may need a Td booster every 10 years.  Varicella  vaccine. You may need this if you have not been vaccinated.  Zoster vaccine. You may need this after age 38.  Measles, mumps, and rubella (MMR) vaccine. You may need at least one dose of MMR if you were born in 1957 or later. You may also need a second dose.  Pneumococcal 13-valent conjugate (PCV13) vaccine. You may need this if you have certain conditions and were not previously vaccinated.  Pneumococcal polysaccharide (PPSV23) vaccine. You may need one or two doses if you smoke cigarettes or if you have certain conditions.  Meningococcal vaccine. You may need this if you have certain conditions.  Hepatitis A vaccine. You may need this if you have certain conditions or if you travel or work in places where you may be exposed to hepatitis A.  Hepatitis B vaccine. You may need this if you have certain conditions or if you travel or work in places where you may be exposed to hepatitis B.  Haemophilus influenzae type b (Hib) vaccine. You may need this if you have certain conditions. Talk to your health care provider about which screenings and vaccines you need and how often you need them. This information is not intended to replace advice given to you by your health care provider. Make sure you discuss any questions you have with your health care provider. Document Released: 07/31/2015 Document Revised: 08/24/2017 Document Reviewed: 05/05/2015 Elsevier Interactive Patient Education  2019 [employer].

## 2018-08-14 NOTE — Progress Notes (Signed)
Name: Nicole Roman   MRN: [id number]    DOB: September 27, 1975   Date:08/14/2018       Progress Note  Subjective  Chief Complaint  Chief Complaint  Patient presents with  . Annual Exam    HPI   Patient presents for annual CPE and follow up  B12 deficiency: sister is a CMA and gives her the injections monthly, she states she skipped last month and she has noticed fatigue since.   Rash: going on for months, she tried otc medication clotrimazole and cortisone cream it helps intermittently but rash returns on the same spots, left arm and top of right foot. It is itchy and round, but no raised borders. Seen by another physician recently and was given lamisil, she also has some dry skin on left outer hip and noticing some pruritis on left breast, we will place referral to dermatologist   Goiter: she had an [place] last September, last TSH was normal, nodules not big enough for biopsy  Diet: eats mostly at home, lots of fruit and vegetable.  Exercise:  She is going back now, usually 2 times a week for 90 minutes   USPSTF grade A and B recommendations    [facility] Visit from 08/14/2018 in [facility]  AUDIT-C Score  1     Depression:  Depression screen [facility] 2/9 08/14/2018 03/14/2018 07/21/2017 03/22/2017  Decreased Interest 0 0 0 0  Down, Depressed, Hopeless 0 0 0 0  PHQ - 2 Score 0 0 0 0  Altered sleeping - 0 - -  Tired, decreased energy - 0 - -  Change in appetite - 0 - -  Feeling bad or failure about yourself  - 0 - -  Trouble concentrating - 0 - -  Moving slowly or fidgety/restless - 0 - -  Suicidal thoughts - 0 - -  PHQ-9 Score - 0 - -  Difficult doing work/chores - Not difficult at all - -   Hypertension: BP Readings from Last 3 Encounters:  08/14/18 130/90  06/05/18 130/80  05/02/18 118/72   Obesity: Wt Readings from Last 3 Encounters:  08/14/18 144 lb 14.4 oz (65.7 kg)  06/05/18 145 lb 4.8 oz (65.9 kg)  05/02/18 146 lb (66.2 kg)   BMI Readings from Last 3  Encounters:  08/14/18 24.87 kg/m  06/05/18 24.94 kg/m  05/02/18 25.06 kg/m    Hep C Screening: at work  STD testing and prevention (HIV/chl/gon/syphilis): N/A Intimate partner violence: negative  Sexual History/Pain during Intercourse: no pain Menstrual History/LMP/Abnormal Bleeding: IUD, irregular  Incontinence Symptoms: no   Advanced Care Planning: A voluntary discussion about advance care planning including the explanation and discussion of advance directives.  Discussed health care proxy and Living will, and the patient was able to identify a health care proxy as sister - Lynnda Child .  Patient does not have a living will at present time. If patient does have living will, I have requested they bring this to the clinic to be scanned in to their chart.  Breast cancer: due in June  BRCA gene screening: N/A Cervical cancer screening: today   Osteoporosis Screening: discussed high calcium diet   Lipids:  Lab Results  Component Value Date   CHOL 158 10/04/2017   CHOL 150 08/31/2016   CHOL 155 05/20/2015   Lab Results  Component Value Date   HDL 47 10/04/2017   HDL 45 08/31/2016   HDL 48 05/20/2015   Lab Results  Component Value Date  [place] 105 (H) 10/04/2017   LDLCALC 99 08/31/2016   LDLCALC 99 05/20/2015   Lab Results  Component Value Date   TRIG 30 10/04/2017   TRIG 32 08/31/2016   TRIG 41 05/20/2015   Lab Results  Component Value Date   CHOLHDL 3.4 10/04/2017   CHOLHDL 3.3 08/31/2016   CHOLHDL 3.2 05/20/2015   No results found for: LDLDIRECT  Glucose:  Glucose  Date Value Ref Range Status  06/30/2017 82 65 - 99 mg/dL Final  08/31/2016 94 65 - 99 mg/dL Final  05/20/2015 85 65 - 99 mg/dL Final    Skin cancer: discussed atypical lesions  Colorectal cancer: we will wait until 19 or 78, mother had colon cancer at age 82 Lung cancer:   Low Dose CT Chest recommended if Age 23-80 years, 30 pack-year currently smoking OR have quit w/in 15years.  Patient does not qualify.   [id number]  Patient Active Problem List   Diagnosis Date Noted  . B12 deficiency 08/14/2018  . ASCUS of cervix with negative high risk HPV 08/14/2018  . ASCUS favor benign 02/11/2016  . Ovarian cyst, right 02/09/2016  . History of abnormal cervical Pap smear 02/08/2016  . Goiter 02/08/2016    Past Surgical History:  Procedure Laterality Date  . 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 Aretha Parrot with [facility] in [place]  . LEEP  2002  . tummy tuck  07/30/2017   Dr. Aretha Parrot with [facility] in [facility]    Family History  Problem Relation Age of Onset  . Cancer Mother        colon  . Hypertension Mother   . Diabetes Father   . Breast cancer Neg Hx     Social History   Socioeconomic History  . Marital status: Divorced    Spouse name: Not on file  . Number of children: 2  . Years of education: Not on file  . Highest education level: Some college, no degree  Occupational History  . Not on file  Social Needs  . Financial resource strain: Not hard at all  . Food insecurity:    Worry: Never true    Inability: Never true  . Transportation needs:    Medical: No    Non-medical: No  Tobacco Use  . Smoking status: Never Smoker  . Smokeless tobacco: Never Used  Substance and Sexual Activity  . Alcohol use: Yes    Comment: occasionally  . Drug use: No  . Sexual activity: Yes    Partners: Male    Birth control/protection: I.U.D.  Lifestyle  . Physical activity:    Days per week: 3 days    Minutes per session: 90 min  . Stress: Not at all  Relationships  . Social connections:    Talks on phone: More than three times a week    Gets together: Twice a week    Attends religious service: Never    Active member of club or organization: No    Attends meetings of clubs or organizations: Never    Relationship status: Divorced  . Intimate partner violence:    Fear of current or ex partner: No    Emotionally abused: No    Physically  abused: No    Forced sexual activity: No  Other Topics Concern  . Not on file  Social History Narrative   Patient is originally from [place] for many years     Current Outpatient Medications:  .  cyanocobalamin (,VITAMIN B-12,) 1000  MCG/ML injection, INJECT 1 ML (1,000 MCG TOTAL) INTO THE MUSCLE ONCE FOR 1 DOSE., Disp: 1 mL, Rfl: 5 .  levonorgestrel (MIRENA) 20 MCG/24HR IUD, 1 each by Intrauterine route once., Disp: , Rfl:  .  terbinafine (LAMISIL) 250 MG tablet, , Disp: , Rfl:   No Known Allergies   ROS  Constitutional: Negative for fever or weight change.  Respiratory: Negative for cough and shortness of breath.   Cardiovascular: Negative for chest pain or palpitations.  Gastrointestinal: Negative for abdominal pain, no bowel changes.  Musculoskeletal: Negative for gait problem or joint swelling.  Skin: positive for rash on right foot and left arm.  Neurological: Negative for dizziness or headache.  No other specific complaints in a complete review of systems (except as listed in HPI above).  Objective  Vitals:   08/14/18 0915  BP: 130/90  Pulse: 87  Resp: 12  Temp: 99.8 F (37.7 C)  TempSrc: Oral  SpO2: 98%  Weight: 144 lb 14.4 oz (65.7 kg)  Height: '5\' 4"'$  (1.626 m)    Body mass index is 24.87 kg/m.  Physical Exam  Constitutional: Patient appears well-developed and well-nourished. No distress.  HENT: Head: Normocephalic and atraumatic. Ears: B TMs ok, no erythema or effusion; Nose: Nose normal. Mouth/Throat: Oropharynx is clear and moist. No oropharyngeal exudate.  Eyes: Conjunctivae and EOM are normal. Pupils are equal, round, and reactive to light. No scleral icterus.  Neck: Normal range of motion. Neck supple. No JVD present. thyromegaly present.  Cardiovascular: Normal rate, regular rhythm and normal heart sounds.  No murmur heard. No BLE edema. Pulmonary/Chest: Effort normal and breath sounds normal. No respiratory distress. Abdominal: Soft.  Bowel sounds are normal, no distension. There is no tenderness. no masses Breast: she has pea size nodules on both breasts, I was going to order diagnostic US but she states per gyn normal for her and present for a long time  no nipple discharge , spot that feel rough on left outer breast but no redness noticed  FEMALE GENITALIA:  External genitalia normal External urethra normal Vaginal vault normal without discharge or lesions Cervix normal without discharge or lesions Bimanual exam normal without masses Spotted during exam IUD strings not seen and had [place] in the past to confirm placement RECTAL: not done  Musculoskeletal: Normal range of motion, no joint effusions. No gross deformities Neurological: he is alert and oriented to person, place, and time. No cranial nerve deficit. Coordination, balance, strength, speech and gait are normal.  Skin: Skin is warm and dry. Rash - hyperpigmentation on left arm and top of right foot, no raised rash.  Psychiatric: Patient has a normal mood and affect. behavior is normal. Judgment and thought content normal.   Recent Results (from the past 2160 hour(s))  Cervicovaginal ancillary only     Status: Abnormal   Collection Time: 06/05/18 12:00 AM  Result Value Ref Range   Bacterial vaginitis Negative for Bacterial Vaginitis Microorganisms     Comment: Normal Reference Range - Negative   Candida vaginitis **POSITIVE for Candida species** (A)     Comment: Normal Reference Range - Negative   Chlamydia Negative     Comment: Normal Reference Range - Negative   Neisseria gonorrhea Negative     Comment: Normal Reference Range - Negative   Trichomonas Negative     Comment: Normal Reference Range - Negative     PHQ2/9: Depression screen [facility] 2/9 08/14/2018 03/14/2018 07/21/2017 03/22/2017  Decreased Interest 0 0 0 0  Down, Depressed,  Hopeless 0 0 0 0  PHQ - 2 Score 0 0 0 0  Altered sleeping - 0 - -  Tired, decreased energy - 0 - -  Change in appetite - 0 - -   Feeling bad or failure about yourself  - 0 - -  Trouble concentrating - 0 - -  Moving slowly or fidgety/restless - 0 - -  Suicidal thoughts - 0 - -  PHQ-9 Score - 0 - -  Difficult doing work/chores - Not difficult at all - -     Fall Risk: Fall Risk  08/14/2018 06/05/2018 03/14/2018 07/21/2017 06/30/2017  Falls in the past year? 0 0 No No No  Number falls in past yr: 0 - - - -  Injury with Fall? 0 - - - -     Functional Status Survey: Is the patient deaf or have difficulty hearing?: No Does the patient have difficulty seeing, even when wearing glasses/contacts?: No Does the patient have difficulty concentrating, remembering, or making decisions?: No Does the patient have difficulty walking or climbing stairs?: No Does the patient have difficulty dressing or bathing?: No Does the patient have difficulty doing errands alone such as visiting a doctor's office or shopping?: No   Assessment & Plan  1. Well woman exam   2. Screen for STD (sexually transmitted disease)  - Cytology - PAP  3. Cervical cancer screening  - Cytology - PAP  4. Rash  Referral dermatologist   5. B12 deficiency  - cyanocobalamin (,VITAMIN B-12,) 1000 MCG/ML injection; INJECT 1 ML (1,000 MCG TOTAL) INTO THE MUSCLE ONCE FOR 1 DOSE.  Dispense: 1 mL; Refill: 5  6. Goiter  Normal TSH, thyroid US showed diffuse goiter  7. ASCUS of cervix with negative high risk HPV  - Cytology - PAP   -USPSTF grade A and B recommendations reviewed with patient; age-appropriate recommendations, preventive care, screening tests, etc discussed and encouraged; healthy living encouraged; see AVS for patient education given to patient -Discussed importance of 150 minutes of physical activity weekly, eat two servings of fish weekly, eat one serving of tree nuts ( cashews, pistachios, pecans, almonds.Marland Kitchen) every other day, eat 6 servings of fruit/vegetables daily and drink plenty of water and avoid sweet beverages.

## 2018-08-15 ENCOUNTER — Encounter: Payer: Self-pay | Admitting: Family Medicine

## 2018-08-16 LAB — CYTOLOGY - PAP
DIAGNOSIS: NEGATIVE
HPV: NOT DETECTED

## 2018-09-10 ENCOUNTER — Ambulatory Visit: Payer: Managed Care, Other (non HMO) | Admitting: Nurse Practitioner

## 2018-09-10 ENCOUNTER — Other Ambulatory Visit: Payer: Self-pay | Admitting: Nurse Practitioner

## 2018-09-10 ENCOUNTER — Encounter: Payer: Self-pay | Admitting: Nurse Practitioner

## 2018-09-10 VITALS — BP 124/72 | HR 74 | Temp 98.3°F | Resp 16 | Ht 64.0 in | Wt 149.5 lb

## 2018-09-10 DIAGNOSIS — H6123 Impacted cerumen, bilateral: Secondary | ICD-10-CM | POA: Diagnosis not present

## 2018-09-10 DIAGNOSIS — R0981 Nasal congestion: Secondary | ICD-10-CM | POA: Diagnosis not present

## 2018-09-10 NOTE — Progress Notes (Signed)
Name: Nicole Roman   MRN: [id number]    DOB: 30-May-1976   Date:09/10/2018       Progress Note  Subjective  Chief Complaint  Chief Complaint  Patient presents with  . Cerumen Impaction    Onset- couple of days only in right ear.    HPI  Patient notes cerumen impaction in right ear- muffled sounds. Used cotton swab and it pushed it back farther. Denies pain, fevers, ear drainage. Had mild nasal congestion yesterday that self-resolved.   Patient Active Problem List   Diagnosis Date Noted  . B12 deficiency 08/14/2018  . ASCUS of cervix with negative high risk HPV 08/14/2018  . ASCUS favor benign 02/11/2016  . Ovarian cyst, right 02/09/2016  . History of abnormal cervical Pap smear 02/08/2016  . Goiter 02/08/2016    Past Medical History:  Diagnosis Date  . Breast lump   . Iron deficiency anemia   . Vaginal Pap smear, abnormal     Past Surgical History:  Procedure Laterality Date  . 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 Aretha Parrot with [facility] in [place]  . LEEP  2002  . tummy tuck  07/30/2017   Dr. Aretha Parrot with [facility] in [place] History   Tobacco Use  . Smoking status: Never Smoker  . Smokeless tobacco: Never Used  Substance Use Topics  . Alcohol use: Yes    Comment: occasionally     Current Outpatient Medications:  .  cyanocobalamin (,VITAMIN B-12,) 1000 MCG/ML injection, INJECT 1 ML (1,000 MCG TOTAL) INTO THE MUSCLE ONCE FOR 1 DOSE., Disp: 1 mL, Rfl: 5 .  levonorgestrel (MIRENA) 20 MCG/24HR IUD, 1 each by Intrauterine route once., Disp: , Rfl:  .  mometasone (ELOCON) 0.1 % cream, Apply 1 application topically daily as needed., Disp: , Rfl:  .  terbinafine (LAMISIL) 250 MG tablet, , Disp: , Rfl:   No Known Allergies  ROS   No other specific complaints in a complete review of systems (except as listed in HPI above).  Objective  Vitals:   09/10/18 1556  BP: 124/72  Pulse: 74  Resp: 16  Temp: 98.3 F (36.8 C)  TempSrc:  Oral  SpO2: 98%  Weight: 149 lb 8 oz (67.8 kg)  Height: 5\' 4"  (1.626 m)    Body mass index is 25.66 kg/m.  Nursing Note and Vital Signs reviewed.  Physical Exam Constitutional:      Appearance: Normal appearance.  HENT:     Right Ear: There is impacted cerumen.     Left Ear: There is impacted cerumen.     Ears:     Comments: Bilaterally cleared, no erythema or bulging of TM    Nose: No congestion or rhinorrhea.  Cardiovascular:     Rate and Rhythm: Normal rate.  Pulmonary:     Effort: Pulmonary effort is normal.  Skin:    General: Skin is warm and dry.  Neurological:     General: No focal deficit present.     Mental Status: She is alert and oriented to person, place, and time.  Psychiatric:        Mood and Affect: Mood normal.      No results found for this or any previous visit (from the past 48 hour(s)).  Assessment & Plan  1. Bilateral impacted cerumen Bilateral ear lavage completed  For earwax build up- do not use cotton swabs in your ears, you can use ONE of these over the counter options to soften  up with ear wax in your ear in the future: mineral oil drops, docusate (liquid), debrox. Place a 2 drops in your ear while laying on your left side- Keep laying for 30-60 minutes on that side after application. Can use once daily to soften ear wax. Wash in shower afterwards,  . do not stick anything in your ear as it further impact the ear wax  - Ear Lavage    3. Nasal congestion - If having nasal congestion or muffled sounds you can take flonase one spray in each nostril as needed a daily anti-histamine such as Claritin, zyrtec, Allegra or xyzal as needed.

## 2018-09-10 NOTE — Patient Instructions (Addendum)
For earwax build up- do not use cotton swabs in your ears, you can use ONE of these over the counter options to soften up with ear wax in your ear in the future: mineral oil drops, docusate (liquid), debrox. Place a 2 drops in your ear while laying on your left side- Keep laying for 30-60 minutes on that side after application. Can use once daily to soften ear wax. Wash in shower afterwards,  . do not stick anything in your ear as it further impact the ear wax   - If having nasal congestion or muffled sounds you can take flonase one spray in each nostril as needed a daily anti-histamine such as Claritin, zyrtec, Allegra or xyzal as needed.

## 2018-12-12 ENCOUNTER — Other Ambulatory Visit: Payer: Self-pay

## 2018-12-12 DIAGNOSIS — B379 Candidiasis, unspecified: Secondary | ICD-10-CM

## 2018-12-12 MED ORDER — FLUCONAZOLE 150 MG PO TABS: 150.0000 mg | ORAL_TABLET | ORAL | 0 refills | Status: DC

## 2018-12-12 NOTE — Telephone Encounter (Signed)
Copied from [place] [id number]. Topic: General - Other >> Dec 12, 2018  2:17 PM Leward Quan A wrote: Reason for CRM: Patient called to say that she is experiencing a yeast infection she is having white vaginal discharge with itchyness and is requesting Rx sent to the pharmacy please. Any questions please call  Ph# [phone]

## 2018-12-19 ENCOUNTER — Ambulatory Visit: Payer: Managed Care, Other (non HMO) | Admitting: Family Medicine

## 2018-12-19 ENCOUNTER — Other Ambulatory Visit: Payer: Self-pay

## 2018-12-19 ENCOUNTER — Encounter: Payer: Self-pay | Admitting: Family Medicine

## 2018-12-19 VITALS — BP 122/84 | HR 86 | Temp 98.2°F | Resp 16 | Ht 64.0 in | Wt 144.4 lb

## 2018-12-19 DIAGNOSIS — R3915 Urgency of urination: Secondary | ICD-10-CM | POA: Diagnosis not present

## 2018-12-19 DIAGNOSIS — N3000 Acute cystitis without hematuria: Secondary | ICD-10-CM

## 2018-12-19 LAB — POCT URINALYSIS DIPSTICK
Bilirubin, UA: NEGATIVE
Blood, UA: NEGATIVE
Glucose, UA: NEGATIVE
Ketones, UA: NEGATIVE
Nitrite, UA: NEGATIVE
Protein, UA: POSITIVE — AB
Spec Grav, UA: 1.015 (ref 1.010–1.025)
Urobilinogen, UA: 0.2 E.U./dL
pH, UA: 5 (ref 5.0–8.0)

## 2018-12-19 MED ORDER — NITROFURANTOIN MONOHYD MACRO 100 MG PO CAPS: 100.0000 mg | ORAL_CAPSULE | Freq: Two times a day (BID) | ORAL | 0 refills | Status: AC

## 2018-12-19 NOTE — Patient Instructions (Signed)
Urinary Tract Infection, Adult A urinary tract infection (UTI) is an infection of any part of the urinary tract. The urinary tract includes:  The kidneys.  The ureters.  The bladder.  The urethra. These organs make, store, and get rid of pee (urine) in the body. What are the causes? This is caused by germs (bacteria) in your genital area. These germs grow and cause swelling (inflammation) of your urinary tract. What increases the risk? You are more likely to develop this condition if:  You have a small, thin tube (catheter) to drain pee.  You cannot control when you pee or poop (incontinence).  You are female, and: ? You use these methods to prevent pregnancy: ? A medicine that kills sperm (spermicide). ? A device that blocks sperm (diaphragm). ? You have low levels of a female hormone (estrogen). ? You are pregnant.  You have genes that add to your risk.  You are sexually active.  You take antibiotic medicines.  You have trouble peeing because of: ? A prostate that is bigger than normal, if you are female. ? A blockage in the part of your body that drains pee from the bladder (urethra). ? A kidney stone. ? A nerve condition that affects your bladder (neurogenic bladder). ? Not getting enough to drink. ? Not peeing often enough.  You have other conditions, such as: ? Diabetes. ? A weak disease-fighting system (immune system). ? Sickle cell disease. ? Gout. ? Injury of the spine. What are the signs or symptoms? Symptoms of this condition include:  Needing to pee right away (urgently).  Peeing often.  Peeing small amounts often.  Pain or burning when peeing.  Blood in the pee.  Pee that smells bad or not like normal.  Trouble peeing.  Pee that is cloudy.  Fluid coming from the vagina, if you are female.  Pain in the belly or lower back. Other symptoms include:  Throwing up (vomiting).  No urge to eat.  Feeling mixed up (confused).  Being tired  and grouchy (irritable).  A fever.  Watery poop (diarrhea). How is this treated? This condition may be treated with:  Antibiotic medicine.  Other medicines.  Drinking enough water. Follow these instructions at home:  Medicines  Take over-the-counter and prescription medicines only as told by your doctor.  If you were prescribed an antibiotic medicine, take it as told by your doctor. Do not stop taking it even if you start to feel better. General instructions  Make sure you: ? Pee until your bladder is empty. ? Do not hold pee for a long time. ? Empty your bladder after sex. ? Wipe from front to back after pooping if you are a female. Use each tissue one time when you wipe.  Drink enough fluid to keep your pee pale yellow.  Keep all follow-up visits as told by your doctor. This is important. Contact a doctor if:  You do not get better after 1-2 days.  Your symptoms go away and then come back. Get help right away if:  You have very bad back pain.  You have very bad pain in your lower belly.  You have a fever.  You are sick to your stomach (nauseous).  You are throwing up. Summary  A urinary tract infection (UTI) is an infection of any part of the urinary tract.  This condition is caused by germs in your genital area.  There are many risk factors for a UTI. These include having a small, thin   tube to drain pee and not being able to control when you pee or poop.  Treatment includes antibiotic medicines for germs.  Drink enough fluid to keep your pee pale yellow. This information is not intended to replace advice given to you by your health care provider. Make sure you discuss any questions you have with your health care provider. Document Released: 12/21/2007 Document Revised: 01/11/2018 Document Reviewed: 01/11/2018 Elsevier Interactive Patient Education  2019 Elsevier Inc.  

## 2018-12-19 NOTE — Progress Notes (Signed)
Name: Nicole Roman   MRN: [id number]    DOB: 1975/11/23   Date:12/19/2018       Progress Note  Subjective  Chief Complaint  Chief Complaint  Patient presents with  . Urinary Frequency    pain to urinate for 5 days    HPI  Pt presents with concern for UTI - she has had urinary frequency, dysuria, and some lower abdominal pain for about 5 days.  Urine has foul odor and leukocytes today.  She denies flank or back pain, no NVD, no history of kidney stones, no abdominal tenderness.  Patient Active Problem List   Diagnosis Date Noted  . B12 deficiency 08/14/2018  . ASCUS of cervix with negative high risk HPV 08/14/2018  . ASCUS favor benign 02/11/2016  . Ovarian cyst, right 02/09/2016  . History of abnormal cervical Pap smear 02/08/2016  . Goiter 02/08/2016    Social History   Tobacco Use  . Smoking status: Never Smoker  . Smokeless tobacco: Never Used  Substance Use Topics  . Alcohol use: Yes    Comment: occasionally     Current Outpatient Medications:  .  clobetasol cream (TEMOVATE) 0.05 %, , Disp: , Rfl:  .  cyanocobalamin (,VITAMIN B-12,) 1000 MCG/ML injection, INJECT 1 ML (1,000 MCG TOTAL) INTO THE MUSCLE ONCE FOR 1 DOSE., Disp: 1 mL, Rfl: 5 .  levonorgestrel (MIRENA) 20 MCG/24HR IUD, 1 each by Intrauterine route once., Disp: , Rfl:  .  triamcinolone (KENALOG) 0.025 % cream, Apply 1 application topically 2 (two) times daily., Disp: , Rfl:  .  fluconazole (DIFLUCAN) 150 MG tablet, Take 1 tablet (150 mg total) by mouth every other day. (Patient not taking: Reported on 12/19/2018), Disp: 3 tablet, Rfl: 0 .  mometasone (ELOCON) 0.1 % cream, Apply 1 application topically daily as needed., Disp: , Rfl:  .  terbinafine (LAMISIL) 250 MG tablet, , Disp: , Rfl:   No Known Allergies  I personally reviewed active problem list, medication list, allergies, lab results with the patient/caregiver today.  ROS  Ten systems reviewed and is negative except as mentioned in HPI   Objective  Vitals:   12/19/18 0929  BP: 122/84  Pulse: 86  Resp: 16  Temp: 98.2 F (36.8 C)  TempSrc: Oral  SpO2: 94%  Weight: 144 lb 6.4 oz (65.5 kg)  Height: 5\' 4"  (1.626 m)   Body mass index is 24.79 kg/m.  Nursing Note and Vital Signs reviewed.  Physical Exam  Constitutional: Patient appears well-developed and well-nourished.  No distress.  HEENT: head atraumatic, normocephalic Cardiovascular: Normal rate, regular rhythm and normal heart sounds.  No murmur heard. No BLE edema. Pulmonary/Chest: Effort normal and breath sounds clear bilaterally. No respiratory distress. Abdominal: Soft, bowel sounds normal, there is no tenderness, no HSM, no CVA tenderness Psychiatric: Patient has a normal mood and affect. behavior is normal. Judgment and thought content normal.  Results for orders placed or performed in visit on 12/19/18 (from the past 72 hour(s))  POCT urinalysis dipstick     Status: Abnormal   Collection Time: 12/19/18  9:35 AM  Result Value Ref Range   Color, UA gold    Clarity, UA clear    Glucose, UA Negative Negative   Bilirubin, UA negative    Ketones, UA negative    Spec Grav, UA 1.015 1.010 - 1.025   Blood, UA negative    pH, UA 5.0 5.0 - 8.0   Protein, UA Positive (A) Negative   Urobilinogen, UA 0.2  0.2 or 1.0 E.U./dL   Nitrite, UA negative    Leukocytes, UA Trace (A) Negative   Appearance clear    Odor strong     Assessment & Plan  1. Acute cystitis without hematuria - nitrofurantoin, macrocrystal-monohydrate, (MACROBID) 100 MG capsule; Take 1 capsule (100 mg total) by mouth 2 (two) times daily for 5 days.  Dispense: 10 capsule; Refill: 0 - Urine Culture  2. Urinary urgency - POCT urinalysis dipstick  -Red flags and when to present for emergency care or RTC including fever >101.49F, chest pain, shortness of breath, new/worsening/un-resolving symptoms, reviewed with patient at time of visit. Follow up and care instructions discussed and provided  in AVS.

## 2018-12-21 LAB — URINE CULTURE
MICRO NUMBER:: 533858
SPECIMEN QUALITY:: ADEQUATE

## 2019-01-04 ENCOUNTER — Other Ambulatory Visit: Payer: Self-pay

## 2019-01-04 ENCOUNTER — Ambulatory Visit
Admission: RE | Admit: 2019-01-04 | Discharge: 2019-01-04 | Disposition: A | Discharge status: Home / Self Care | Payer: Managed Care, Other (non HMO) | Source: Ambulatory Visit | Attending: Family Medicine | Admitting: Family Medicine

## 2019-01-04 DIAGNOSIS — Z1231 Encounter for screening mammogram for malignant neoplasm of breast: Secondary | ICD-10-CM | POA: Insufficient documentation

## 2019-01-04 DIAGNOSIS — Z1239 Encounter for other screening for malignant neoplasm of breast: Secondary | ICD-10-CM

## 2019-01-06 ENCOUNTER — Telehealth: Payer: Managed Care, Other (non HMO) | Admitting: Physician Assistant

## 2019-01-06 DIAGNOSIS — Z20828 Contact with and (suspected) exposure to other viral communicable diseases: Secondary | ICD-10-CM

## 2019-01-06 DIAGNOSIS — Z20822 Contact with and (suspected) exposure to covid-19: Secondary | ICD-10-CM

## 2019-01-06 DIAGNOSIS — B349 Viral infection, unspecified: Secondary | ICD-10-CM

## 2019-01-06 NOTE — Progress Notes (Signed)
I have spent 5 minutes in review of e-visit questionnaire, review and updating patient chart, medical decision making and response to patient.   Osborn Pullin Cody Carliyah Cotterman, PA-C    

## 2019-01-06 NOTE — Progress Notes (Signed)
E-Visit for Corona Virus Screening   Your current symptoms could be consistent with the coronavirus.  Call your health care provider or local health department to request and arrange formal testing. Many health care providers can now test patients at their office but not all are.  Please quarantine yourself while awaiting your test results.  [place] [phone], [place], [place] [phone] or visit [email or web address]  and You have been enrolled in [place] for COVID-19.  Daily you will receive a questionnaire within the [facility] website. Our COVID-19 response team will be monitoring your responses daily.    COVID-19 is a respiratory illness with symptoms that are similar to the flu. Symptoms are typically mild to moderate, but there have been cases of severe illness and death due to the virus. The following symptoms may appear 2-14 days after exposure: . Fever . Cough . Shortness of breath or difficulty breathing . Chills . Repeated shaking with chills . Muscle pain . Headache . Sore throat . New loss of taste or smell . Fatigue . Congestion or runny nose . Nausea or vomiting . Diarrhea  It is vitally important that if you feel that you have an infection such as this virus or any other virus that you stay home and away from places where you may spread it to others.  You should self-quarantine for 14 days if you have symptoms that could potentially be coronavirus or have been in close contact a with a person diagnosed with COVID-19 within the last 2 weeks. You should avoid contact with people age 62 and older.   You should wear a mask or cloth face covering over your nose and mouth if you must be around other people or animals, including pets (even at home). Try to stay at least 6 feet away from other people. This will protect the  people around you.    You may also take acetaminophen (Tylenol) as needed for fever.   Reduce your risk of any infection by using the same precautions used for avoiding the common cold or flu:  Marland Kitchen Wash your hands often with soap and warm water for at least 20 seconds.  If soap and water are not readily available, use an alcohol-based hand sanitizer with at least 60% alcohol.  . If coughing or sneezing, cover your mouth and nose by coughing or sneezing into the elbow areas of your shirt or coat, into a tissue or into your sleeve (not your hands). . Avoid shaking hands with others and consider head nods or verbal greetings only. . Avoid touching your eyes, nose, or mouth with unwashed hands.  . Avoid close contact with people who are sick. . Avoid places or events with large numbers of people in one location, like concerts or sporting events. . Carefully consider travel plans you have or are making. . If you are planning any travel outside or inside the [place], visit the CDC's Travelers' Health webpage for the latest health notices. . If you have some symptoms but not all symptoms, continue to monitor at home and seek medical attention if your symptoms worsen. . If you are having a medical emergency, call 911.  HOME CARE . Only take medications as instructed by your medical team. . Drink plenty of fluids and get plenty of rest. . A steam or ultrasonic humidifier can help if you have congestion.   GET HELP RIGHT AWAY IF YOU HAVE EMERGENCY WARNING SIGNS** FOR COVID-19.  If you or someone is showing any of these signs seek emergency medical care immediately. Call 911 or proceed to your closest emergency facility if: . You develop worsening high fever. . Trouble breathing . Bluish lips or face . Persistent pain or pressure in the chest . New confusion . Inability to wake or stay awake . You cough up blood. . Your symptoms become more severe  **This list is not all possible symptoms. Contact  your medical provider for any symptoms that are sever or concerning to you.   MAKE SURE YOU   Understand these instructions.  Will watch your condition.  Will get help right away if you are not doing well or get worse.  Your e-visit answers were reviewed by a board certified advanced clinical practitioner to complete your personal care plan.  Depending on the condition, your plan could have included both over the counter or prescription medications.  If there is a problem please reply once you have received a response from your provider.  Your safety is important to [place].  If you have drug allergies check your prescription carefully.    You can use MyChart to ask questions about today's visit, request a non-urgent call back, or ask for a work or school excuse for 24 hours related to this e-Visit. If it has been greater than 24 hours you will need to follow up with your provider, or enter a new e-Visit to address those concerns. You will get an e-mail in the next two days asking about your experience.  I hope that your e-visit has been valuable and will speed your recovery. Thank you for using e-visits.

## 2019-01-07 ENCOUNTER — Other Ambulatory Visit: Payer: Self-pay

## 2019-01-07 ENCOUNTER — Ambulatory Visit (INDEPENDENT_AMBULATORY_CARE_PROVIDER_SITE_OTHER): Payer: Managed Care, Other (non HMO) | Admitting: Family Medicine

## 2019-01-07 ENCOUNTER — Telehealth: Payer: Self-pay

## 2019-01-07 ENCOUNTER — Encounter: Payer: Self-pay | Admitting: Family Medicine

## 2019-01-07 DIAGNOSIS — Z20828 Contact with and (suspected) exposure to other viral communicable diseases: Secondary | ICD-10-CM | POA: Diagnosis not present

## 2019-01-07 DIAGNOSIS — R6889 Other general symptoms and signs: Secondary | ICD-10-CM

## 2019-01-07 DIAGNOSIS — Z20822 Contact with and (suspected) exposure to covid-19: Secondary | ICD-10-CM

## 2019-01-07 NOTE — Progress Notes (Signed)
Name: Nicole Roman   MRN: [id number]    DOB: Mar 19, 1976   Date:01/07/2019       Progress Note  Subjective  Chief Complaint  Chief Complaint  Patient presents with  . URI    positive exposure, she is having sore throat and headache    I connected with  Cheryll Cockayne  on 01/07/19 at  2:20 PM EDT by a video enabled telemedicine application and verified that I am speaking with the correct person using two identifiers.  I discussed the limitations of evaluation and management by telemedicine and the availability of in person appointments. The patient expressed understanding and agreed to proceed. Staff also discussed with the patient that there may be a patient responsible charge related to this service. Patient Location: Home Provider Location: Home Additional Individuals present: None  HPI  Pt presents with concern for COVID-19 exposure with sore throat and headache. Her exposure was 2 days prior when a friend went out to dinner with her, and this friend then tested positive for COVID-19 the next day.  She has had mild headache and sore throat for about 1 day.  No fevers/chills, cough, shortness of breath, or chest pain.  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  Patient Active Problem List   Diagnosis Date Noted  . B12 deficiency 08/14/2018  . ASCUS of cervix with negative high risk HPV 08/14/2018  . ASCUS favor benign 02/11/2016  . Ovarian cyst, right 02/09/2016  . History of abnormal cervical Pap smear 02/08/2016  . Goiter 02/08/2016    Social History   Tobacco Use  . Smoking status: Never Smoker  . Smokeless tobacco: Never Used  Substance Use Topics  . Alcohol use: Yes    Comment: occasionally     Current Outpatient Medications:  .  clobetasol cream (TEMOVATE) 0.05 %, , Disp: , Rfl:  .  cyanocobalamin (,VITAMIN B-12,) 1000 MCG/ML injection, INJECT 1 ML (1,000 MCG TOTAL) INTO THE MUSCLE ONCE FOR 1 DOSE., Disp: 1 mL, Rfl: 5 .  levonorgestrel (MIRENA) 20  MCG/24HR IUD, 1 each by Intrauterine route once., Disp: , Rfl:  .  triamcinolone (KENALOG) 0.025 % cream, Apply 1 application topically 2 (two) times daily., Disp: , Rfl:  .  mometasone (ELOCON) 0.1 % cream, Apply 1 application topically daily as needed., Disp: , Rfl:  .  terbinafine (LAMISIL) 250 MG tablet, , Disp: , Rfl:   No Known Allergies  I personally reviewed active problem list, medication list, allergies, notes from last encounter with the patient/caregiver today.  ROS  Ten systems reviewed and is negative except as mentioned in HPI   Objective  Virtual encounter, vitals not obtained.  There is no height or weight on file to calculate BMI.  Nursing Note and Vital Signs reviewed.  Physical Exam  Constitutional: Patient appears well-developed and well-nourished. No distress.  HENT: Head: Normocephalic and atraumatic.  Neck: Normal range of motion. Pulmonary/Chest: Effort normal. No respiratory distress. Speaking in complete sentences Neurological: Pt is alert and oriented to person, place, and time. Coordination, speech are normal.  Psychiatric: Patient has a normal mood and affect. behavior is normal. Judgment and thought content normal.  No results found for this or any previous visit (from the past 72 hour(s)).  Assessment & Plan 1. Suspected Covid-19 Virus Infection Mychart symptom and temperautre monitoring per Evisit.  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  Per CDC guidelines, she is to remain [place] x14 days from exposure to known COVID+ patient.  2. Close Exposure to Covid-19 Virus   -Red flags and when to present for emergency care or RTC including fever >101.90F, chest pain, shortness of breath, new/worsening/un-resolving symptoms, reviewed with patient at time of visit. Follow up and care instructions discussed and provided in AVS. - I discussed the assessment and treatment plan with  the patient. The patient was provided an opportunity to ask questions and all were answered. The patient agreed with the plan and demonstrated an understanding of the instructions.  I provided 13 minutes of non-face-to-face time during this encounter.  Hubbard Hartshorn, FNP

## 2019-01-07 NOTE — Patient Instructions (Addendum)
I suspect you may have COVID-19/coronavirus and recommend that you are tested to confirm.  Please call [facility]'s COVID-19 hotline at [phone] to determine if you are eligible for testing (if [facility] is unable to test you).

## 2019-01-07 NOTE — Telephone Encounter (Signed)
Spoke with patient.  Scheduled her for testing today at the [facility] building at [facility] at 2:45 pm.  Testing protocol reviewed with patient, she expressed understanding.

## 2019-01-07 NOTE — Telephone Encounter (Signed)
Patient was seen today by Raelyn Ensign, FNP for virtual visit.  She needs to be tested for Covid.  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  She is symptomatic with headache and sore throat.

## 2019-01-11 ENCOUNTER — Ambulatory Visit: Payer: Self-pay | Admitting: *Deleted

## 2019-01-11 LAB — NOVEL CORONAVIRUS, NAA: SARS-CoV-2, NAA: DETECTED — AB

## 2019-01-11 NOTE — Telephone Encounter (Signed)
Pt has question about her covid 19 test results. She don't understand the results.  Returned call to patient regarding her test result which showed "detected". Pt assured that her result was positive and that she should remain quarantine for the full 14 days. She voiced understanding. Her only symptom now is feeling a little weak. Advised to look at the CDC/Coronavirus web site regarding the virus. She voiced understanding. Will call back for any other concerns. Routing to the [facility] for review.

## 2019-01-16 ENCOUNTER — Telehealth: Payer: Self-pay | Admitting: Family Medicine

## 2019-01-16 NOTE — Telephone Encounter (Signed)
Please advise that the results were sent by mistake to another provider and offer our apologies. She was exposed 01/05/19.  She needs to remain quarantined, we will report her test to the health department who will follow her case.  She will be quarantined until all of the following are met:  14 days since symptom onset, 3 days afebrile (<100F) without antipyretics, and symptoms improving.

## 2019-01-16 NOTE — Telephone Encounter (Signed)
I reached out to patient and apologize for not be called or followed up with in regards to her positive COVID results. I did ask how patient was feeling and she informed me that she is feeling much better.  On last week she experienced fatigue, headache, and sore throat, but those symptoms have gone away.  Patient also stated that she has been quarantined the entire time.  Patient also stated that she will need disability paperwork completed by provider due to her missing work because of her positive results.  Patient will email me the paperwork and I will print for the provider to complete and fax to patient's HR.  Patient was appreciative of the apologize.

## 2019-01-16 NOTE — Telephone Encounter (Signed)
Patient called and says that she has the coronavirus and is concerned no one from the office has called to check on her. She says she has friends who says their doctor's office has called to check on them and give them advice. She says no one from [facility] has called and she asked is this not a part of the practice guidelines to call and check up when the patient has covid? She says she had to call to get her positive results, even though she saw it in [place], no one called to tell her they were positive. She says she is just quarantining alone with no direction as to what to do. I advised I will send these concerns to Dr. Ancil Boozer and someone from the office will call back with the recommendations of Dr. Ancil Boozer, she verbalized understanding.

## 2019-01-21 NOTE — Telephone Encounter (Signed)
Pt states she has been quarantined for 14 days after positive testing.  Pt is back at work today and her employer, [employer] requesting a letter stating she can return to work.   Pt hoping you can put on mychart, or she can come pick up, because she needs asap.  Pt states they are not believing that after 14 days she is cleared to return.  Pt is NOT having any sx.

## 2019-01-21 NOTE — Telephone Encounter (Signed)
Please provide note:  She may return to work if: Afebrile for 72 hours Symptoms improving 14 days since symptoms onset

## 2019-01-21 NOTE — Telephone Encounter (Signed)
Letter done and sent through mychart

## 2019-02-12 ENCOUNTER — Other Ambulatory Visit: Payer: Managed Care, Other (non HMO)

## 2019-02-12 ENCOUNTER — Other Ambulatory Visit: Payer: Self-pay

## 2019-02-12 ENCOUNTER — Ambulatory Visit: Payer: Managed Care, Other (non HMO) | Admitting: Family Medicine

## 2019-02-12 ENCOUNTER — Encounter: Payer: Self-pay | Admitting: Family Medicine

## 2019-02-12 VITALS — BP 112/86 | HR 90 | Temp 97.7°F | Resp 16 | Ht 64.0 in | Wt 147.4 lb

## 2019-02-12 DIAGNOSIS — Z131 Encounter for screening for diabetes mellitus: Secondary | ICD-10-CM

## 2019-02-12 DIAGNOSIS — E538 Deficiency of other specified B group vitamins: Secondary | ICD-10-CM | POA: Diagnosis not present

## 2019-02-12 DIAGNOSIS — E049 Nontoxic goiter, unspecified: Secondary | ICD-10-CM | POA: Diagnosis not present

## 2019-02-12 DIAGNOSIS — L308 Other specified dermatitis: Secondary | ICD-10-CM

## 2019-02-12 DIAGNOSIS — Z862 Personal history of diseases of the blood and blood-forming organs and certain disorders involving the immune mechanism: Secondary | ICD-10-CM

## 2019-02-12 DIAGNOSIS — Z8639 Personal history of other endocrine, nutritional and metabolic disease: Secondary | ICD-10-CM | POA: Diagnosis not present

## 2019-02-12 MED ORDER — CYANOCOBALAMIN 1000 MCG/ML IJ SOLN: INTRAMUSCULAR | 1 refills | Status: DC

## 2019-02-12 NOTE — Progress Notes (Signed)
Name: Nicole Roman   MRN: [id number]    DOB: 1975/12/06   Date:02/12/2019       Progress Note  Subjective  Chief Complaint  Chief Complaint  Patient presents with  . Medication Refill    6 month F/U  . Goiter  . B12 deficiency    Does she need to get her B-12 labs checked to see if it is back to normal    HPI  B12 deficiency: sister is a CMA and gives her the injections monthly, last dose was July 5th, she does not like red meat but eats chicken, she has a history of iron deficiency anemia, but not currently taking iron, she denies pica or fatigue at this time  Eczema: on right foot and left arm, she is using topical medication given by Dermatologist - Dr. Phillip Heal. She states still has hyperpigmentation but has a new medication for that   Goiter: she had an [place] last September, last TSH was normal, nodules not big enough for biopsy, unchanged   History of COVID-19: very mild symptoms, she self quarantine for 14 days and has been back to work for 3 weeks and doing well.    Patient Active Problem List   Diagnosis Date Noted  . B12 deficiency 08/14/2018  . ASCUS of cervix with negative high risk HPV 08/14/2018  . ASCUS favor benign 02/11/2016  . Ovarian cyst, right 02/09/2016  . History of abnormal cervical Pap smear 02/08/2016  . Goiter 02/08/2016    Past Surgical History:  Procedure Laterality Date  . 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 Aretha Parrot with [facility] in [place]  . LEEP  2002  . tummy tuck  07/30/2017   Dr. Aretha Parrot with [facility] in [facility]    Family History  Problem Relation Age of Onset  . Cancer Mother        colon  . Hypertension Mother   . Diabetes Father   . Breast cancer Neg Hx     Social History   Socioeconomic History  . Marital status: Divorced    Spouse name: Not on file  . Number of children: 2  . Years of education: Not on file  . Highest education level: Some college, no degree  Occupational History  . Not on file   Social Needs  . Financial resource strain: Not hard at all  . Food insecurity    Worry: Never true    Inability: Never true  . Transportation needs    Medical: No    Non-medical: No  Tobacco Use  . Smoking status: Never Smoker  . Smokeless tobacco: Never Used  Substance and Sexual Activity  . Alcohol use: Yes    Comment: occasionally  . Drug use: No  . Sexual activity: Yes    Partners: Male    Birth control/protection: I.U.D.  Lifestyle  . Physical activity    Days per week: 3 days    Minutes per session: 90 min  . Stress: Not at all  Relationships  . Social connections    Talks on phone: More than three times a week    Gets together: Twice a week    Attends religious service: Never    Active member of club or organization: No    Attends meetings of clubs or organizations: Never    Relationship status: Divorced  . Intimate partner violence    Fear of current or ex partner: No    Emotionally abused: No    Physically abused: No  Forced sexual activity: No  Other Topics Concern  . Not on file  Social History Narrative   Patient is originally from [place]   Dating for many years     Current Outpatient Medications:  .  clobetasol cream (TEMOVATE) 0.05 %, , Disp: , Rfl:  .  cyanocobalamin (,VITAMIN B-12,) 1000 MCG/ML injection, INJECT 1 ML (1,000 MCG TOTAL) INTO THE MUSCLE ONCE FOR 1 DOSE., Disp: 3 mL, Rfl: 1 .  levonorgestrel (MIRENA) 20 MCG/24HR IUD, 1 each by Intrauterine route once., Disp: , Rfl:  .  mometasone (ELOCON) 0.1 % cream, Apply 1 application topically daily as needed., Disp: , Rfl:  .  triamcinolone (KENALOG) 0.025 % cream, Apply 1 application topically 2 (two) times daily., Disp: , Rfl:   No Known Allergies  I personally reviewed active problem list, medication list, allergies, family history with the patient/caregiver today.   ROS  Constitutional: Negative for fever or weight change.  Respiratory: Negative for cough and shortness of breath.    Cardiovascular: Negative for chest pain or palpitations.  Gastrointestinal: Negative for abdominal pain, no bowel changes.  Musculoskeletal: Negative for gait problem or joint swelling.  Skin: Negative for rash.  Neurological: Negative for dizziness or headache.  No other specific complaints in a complete review of systems (except as listed in HPI above).  Objective  Vitals:   02/12/19 0908  BP: 112/86  Pulse: 90  Resp: 16  Temp: 97.7 F (36.5 C)  TempSrc: Temporal  SpO2: 99%  Weight: 147 lb 6.4 oz (66.9 kg)  Height: 5\' 4"  (1.626 m)     Body mass index is 25.3 kg/m.  Physical Exam  Constitutional: Patient appears well-developed and well-nourished. No distress.  HEENT: head atraumatic, normocephalic, pupils equal and reactive to light, neck supple, goiter  Cardiovascular: Normal rate, regular rhythm and normal heart sounds.  No murmur heard. No BLE edema. Pulmonary/Chest: Effort normal and breath sounds normal. No respiratory distress. Abdominal: Soft.  There is no tenderness. Psychiatric: Patient has a normal mood and affect. behavior is normal. Judgment and thought content normal.  Recent Results (from the past 2160 hour(s))  POCT urinalysis dipstick     Status: Abnormal   Collection Time: 12/19/18  9:35 AM  Result Value Ref Range   Color, UA gold    Clarity, UA clear    Glucose, UA Negative Negative   Bilirubin, UA negative    Ketones, UA negative    Spec Grav, UA 1.015 1.010 - 1.025   Blood, UA negative    pH, UA 5.0 5.0 - 8.0   Protein, UA Positive (A) Negative   Urobilinogen, UA 0.2 0.2 or 1.0 E.U./dL   Nitrite, UA negative    Leukocytes, UA Trace (A) Negative   Appearance clear    Odor strong   Urine Culture     Status: Abnormal   Collection Time: 12/19/18  9:45 AM   Specimen: Urine  Result Value Ref Range   MICRO NUMBER: [id number]    SPECIMEN QUALITY: Adequate    Sample Source URINE    STATUS: FINAL    ISOLATE 1: Escherichia coli (A)     Comment:  Greater than 100,000 CFU/mL of Escherichia coli      Susceptibility   Escherichia coli - URINE CULTURE, REFLEX    AMOX/CLAVULANIC 8 Sensitive     AMPICILLIN >=32 Resistant     AMPICILLIN/SULBACTAM >=32 Resistant     CEFAZOLIN* <=4 Not Reportable      * For infections other than  uncomplicated UTIcaused by E. coli, K. pneumoniae or P. mirabilis:Cefazolin is resistant if MIC > or = 8 mcg/mL.(Distinguishing susceptible versus intermediatefor isolates with MIC < or = 4 mcg/mL requiresadditional testing.)For uncomplicated UTI caused by E. coli,K. pneumoniae or P. mirabilis: Cefazolin issusceptible if MIC <32 mcg/mL and predictssusceptible to the oral agents cefaclor, cefdinir,cefpodoxime, cefprozil, cefuroxime, cephalexinand loracarbef.    CEFEPIME <=1 Sensitive     CEFTRIAXONE <=1 Sensitive     CIPROFLOXACIN <=0.25 Sensitive     LEVOFLOXACIN <=0.12 Sensitive     ERTAPENEM <=0.5 Sensitive     GENTAMICIN <=1 Sensitive     IMIPENEM <=0.25 Sensitive     NITROFURANTOIN <=16 Sensitive     PIP/TAZO <=4 Sensitive     TOBRAMYCIN <=1 Sensitive     TRIMETH/SULFA* <=20 Sensitive      * For infections other than uncomplicated UTIcaused by E. coli, K. pneumoniae or P. mirabilis:Cefazolin is resistant if MIC > or = 8 mcg/mL.(Distinguishing susceptible versus intermediatefor isolates with MIC < or = 4 mcg/mL requiresadditional testing.)For uncomplicated UTI caused by E. coli,K. pneumoniae or P. mirabilis: Cefazolin issusceptible if MIC <32 mcg/mL and predictssusceptible to the oral agents cefaclor, cefdinir,cefpodoxime, cefprozil, cefuroxime, cephalexinand loracarbef.Legend:S = Susceptible  I = IntermediateR = Resistant  NS = Not susceptible* = Not tested  NR = Not reported**NN = See antimicrobic comments  Novel Coronavirus, NAA ([facility])     Status: Abnormal   Collection Time: 01/07/19  2:28 PM  Result Value Ref Range   SARS-CoV-2, NAA Detected (A) Not Detected    Comment: This test was developed and its  performance characteristics determined by [employer]. This test has not been FDA cleared or approved. This test has been authorized by FDA under an Emergency Use Authorization (EUA). This test is only authorized for the duration of time the declaration that circumstances exist justifying the authorization of the emergency use of in vitro diagnostic tests for detection of SARS-CoV-2 virus and/or diagnosis of COVID-19 infection under section 564(b)(1) of the Act, 21 U.S.C. 782NFA-2(Z)(3), unless the authorization is terminated or revoked sooner. When diagnostic testing is negative, the possibility of a false negative result should be considered in the context of a patient's recent exposures and the presence of clinical signs and symptoms consistent with COVID-19. An individual without symptoms of COVID-19 and who is not shedding SARS-CoV-2 virus would expect to have a negative (not detected) result in this assay.     PHQ2/9: Depression screen [facility] 2/9 02/12/2019 01/07/2019 12/19/2018 08/14/2018 03/14/2018  Decreased Interest 0 0 0 0 0  Down, Depressed, Hopeless 0 0 0 0 0  PHQ - 2 Score 0 0 0 0 0  Altered sleeping 0 0 0 - 0  Tired, decreased energy 0 0 0 - 0  Change in appetite 0 0 0 - 0  Feeling bad or failure about yourself  0 0 0 - 0  Trouble concentrating 0 0 0 - 0  Moving slowly or fidgety/restless 0 0 0 - 0  Suicidal thoughts - 0 0 - 0  PHQ-9 Score 0 0 0 - 0  Difficult doing work/chores - Not difficult at all Not difficult at all - Not difficult at all    phq 9 is negative   Fall Risk: Fall Risk  02/12/2019 01/07/2019 12/19/2018 08/14/2018 06/05/2018  Falls in the past year? 0 0 0 0 0  Number falls in past yr: 0 0 0 0 -  Injury with Fall? 0 0 0 0 -  Follow up  Falls evaluation completed - Falls evaluation completed - -     Functional Status Survey: Is the patient deaf or have difficulty hearing?: No Does the patient have difficulty seeing, even when wearing  glasses/contacts?: No Does the patient have difficulty concentrating, remembering, or making decisions?: No Does the patient have difficulty walking or climbing stairs?: No Does the patient have difficulty dressing or bathing?: No Does the patient have difficulty doing errands alone such as visiting a doctor's office or shopping?: No    Assessment & Plan  1. B12 deficiency  - B12 - CBC with Differential/Platelet - cyanocobalamin (,VITAMIN B-12,) 1000 MCG/ML injection; INJECT 1 ML (1,000 MCG TOTAL) INTO THE MUSCLE ONCE FOR 1 DOSE.  Dispense: 3 mL; Refill: 1  2. Goiter   3. History of iron deficiency anemia  - CBC with Differential/Platelet - Iron, TIBC and Ferritin Panel  4. Diabetes mellitus screening  - Hemoglobin A1c  5. Other eczema

## 2019-02-13 LAB — CBC WITH DIFFERENTIAL/PLATELET
Basophils Absolute: 0 10*3/uL (ref 0.0–0.2)
Basos: 1 %
EOS (ABSOLUTE): 0 10*3/uL (ref 0.0–0.4)
Eos: 1 %
Hematocrit: 36.6 % (ref 34.0–46.6)
Hemoglobin: 11.7 g/dL (ref 11.1–15.9)
Immature Grans (Abs): 0 10*3/uL (ref 0.0–0.1)
Immature Granulocytes: 0 %
Lymphocytes Absolute: 1.9 10*3/uL (ref 0.7–3.1)
Lymphs: 43 %
MCH: 29.4 pg (ref 26.6–33.0)
MCHC: 32 g/dL (ref 31.5–35.7)
MCV: 92 fL (ref 79–97)
Monocytes Absolute: 0.3 10*3/uL (ref 0.1–0.9)
Monocytes: 7 %
Neutrophils Absolute: 2.1 10*3/uL (ref 1.4–7.0)
Neutrophils: 48 %
Platelets: 212 10*3/uL (ref 150–450)
RBC: 3.98 x10E6/uL (ref 3.77–5.28)
RDW: 12.2 % (ref 11.7–15.4)
WBC: 4.3 10*3/uL (ref 3.4–10.8)

## 2019-02-13 LAB — IRON,TIBC AND FERRITIN PANEL
Ferritin: 15 ng/mL (ref 15–150)
Iron Saturation: 20 % (ref 15–55)
Iron: 68 ug/dL (ref 27–159)
Total Iron Binding Capacity: 348 ug/dL (ref 250–450)
UIBC: 280 ug/dL (ref 131–425)

## 2019-02-13 LAB — HGB A1C W/O EAG: Hgb A1c MFr Bld: 5.2 % (ref 4.8–5.6)

## 2019-02-13 LAB — VITAMIN B12: Vitamin B-12: 285 pg/mL (ref 232–1245)

## 2019-04-15 ENCOUNTER — Encounter: Payer: Self-pay | Admitting: Family Medicine

## 2019-04-15 ENCOUNTER — Ambulatory Visit: Payer: Managed Care, Other (non HMO) | Admitting: Family Medicine

## 2019-04-15 ENCOUNTER — Other Ambulatory Visit: Payer: Self-pay

## 2019-04-15 VITALS — BP 130/76 | HR 82 | Temp 97.0°F | Resp 16 | Ht 64.0 in | Wt 149.5 lb

## 2019-04-15 DIAGNOSIS — N898 Other specified noninflammatory disorders of vagina: Secondary | ICD-10-CM

## 2019-04-15 DIAGNOSIS — H6123 Impacted cerumen, bilateral: Secondary | ICD-10-CM

## 2019-04-15 DIAGNOSIS — L989 Disorder of the skin and subcutaneous tissue, unspecified: Secondary | ICD-10-CM | POA: Diagnosis not present

## 2019-04-15 DIAGNOSIS — L308 Other specified dermatitis: Secondary | ICD-10-CM

## 2019-04-15 MED ORDER — FLUCONAZOLE 150 MG PO TABS: 150.0000 mg | ORAL_TABLET | ORAL | 0 refills | Status: DC

## 2019-04-15 NOTE — Progress Notes (Signed)
Name: Nicole Roman   MRN: [id number]    DOB: February 22, 1976   Date:04/15/2019       Progress Note  Subjective  Chief Complaint  Chief Complaint  Patient presents with  . Skin Discoloration    Onset-1 year, spots are getting worst. Try start off small and grow in size-dark discoloration on her skin-right foot, right calve, left thigh, and left arm. Start out itchy, red.   . Tinnitus    Onset-4 days ago, right ear starting ringing and feeling strange  . Vaginal Itching    Having white discharge-feels like she has a yeast infection    HPI  Skin lesion: she developed a dark round spot on right dorsal foot about a year ago, it gets less itchy but intermittently gets red and itchy, after that one on left upper arm and now on her legs also . She has seen dermatologist and has been using creams but is worried because it is not resolving and is not sure what is causing the problem.   Vaginal itching: she has recurrent yeast infection, she took one diflucan and symptoms improved but not completely clear the symptoms. She states has a mild white discharge, not itchy , no smells. She states that is typical for her. No new sexual partner, she states it is usually prior to her cycle and she is currently spotting.   Ear fullness: on right side only, states may have heart a sound a couple of days ago but that has resolved, feels " weird ",no pain or fever, no redness or dizziness.   Patient Active Problem List   Diagnosis Date Noted  . B12 deficiency 08/14/2018  . ASCUS of cervix with negative high risk HPV 08/14/2018  . ASCUS favor benign 02/11/2016  . Ovarian cyst, right 02/09/2016  . History of abnormal cervical Pap smear 02/08/2016  . Goiter 02/08/2016    Past Surgical History:  Procedure Laterality Date  . 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 Aretha Parrot with [facility] in [place]  . LEEP  2002  . tummy tuck  07/30/2017   Dr. Aretha Parrot with [facility] in [facility]    Family History   Problem Relation Age of Onset  . Cancer Mother        colon  . Hypertension Mother   . Diabetes Father   . Breast cancer Neg Hx     Social History   Socioeconomic History  . Marital status: Divorced    Spouse name: Not on file  . Number of children: 2  . Years of education: Not on file  . Highest education level: Some college, no degree  Occupational History  . Not on file  Social Needs  . Financial resource strain: Not hard at all  . Food insecurity    Worry: Never true    Inability: Never true  . Transportation needs    Medical: No    Non-medical: No  Tobacco Use  . Smoking status: Never Smoker  . Smokeless tobacco: Never Used  Substance and Sexual Activity  . Alcohol use: Yes    Comment: occasionally  . Drug use: No  . Sexual activity: Yes    Partners: Male    Birth control/protection: I.U.D.  Lifestyle  . Physical activity    Days per week: 3 days    Minutes per session: 90 min  . Stress: Not at all  Relationships  . Social connections    Talks on phone: More than three times a week  Gets together: Twice a week    Attends religious service: Never    Active member of club or organization: No    Attends meetings of clubs or organizations: Never    Relationship status: Divorced  . Intimate partner violence    Fear of current or ex partner: No    Emotionally abused: No    Physically abused: No    Forced sexual activity: No  Other Topics Concern  . Not on file  Social History Narrative   Patient is originally from [place]   Dating for many years     Current Outpatient Medications:  .  clobetasol cream (TEMOVATE) 0.05 %, , Disp: , Rfl:  .  cyanocobalamin (,VITAMIN B-12,) 1000 MCG/ML injection, INJECT 1 ML (1,000 MCG TOTAL) INTO THE MUSCLE ONCE FOR 1 DOSE., Disp: 3 mL, Rfl: 1 .  hydroquinone 4 % cream, TAKE 1.0 APPLICATION(S) TOPICAL EVERY DAY BEFORE NOON, Disp: , Rfl:  .  levonorgestrel (MIRENA) 20 MCG/24HR IUD, 1 each by Intrauterine route once.,  Disp: , Rfl:  .  mometasone (ELOCON) 0.1 % cream, Apply 1 application topically daily as needed., Disp: , Rfl:  .  triamcinolone (KENALOG) 0.025 % cream, Apply 1 application topically 2 (two) times daily., Disp: , Rfl:   No Known Allergies  I personally reviewed active problem list, medication list, allergies, family history, social history, health maintenance with the patient/caregiver today.   ROS  Ten systems reviewed and is negative except as mentioned in HPI   Objective  Vitals:   04/15/19 1443  BP: 130/76  Pulse: 82  Resp: 16  Temp: (!) 97 F (36.1 C)  TempSrc: Temporal  SpO2: 98%  Weight: 149 lb 8 oz (67.8 kg)  Height: 5\' 4"  (1.626 m)    Body mass index is 25.66 kg/m.  Physical Exam  Constitutional: Patient appears well-developed and well-nourished. No distress.  HEENT: head atraumatic, normocephalic, pupils equal and reactive to light, ears cerumen impaction bilaterally  neck supple, throat within normal limits Cardiovascular: Normal rate, regular rhythm and normal heart sounds.  No murmur heard. No BLE edema. Pulmonary/Chest: Effort normal and breath sounds normal. No respiratory distress. Abdominal: Soft.  There is no tenderness. Psychiatric: Patient has a normal mood and affect. behavior is normal. Judgment and thought content normal. Skin: very smooth, hyperpigmented round lesions of different sizes on right foot , left upper outer arm and legs.   Recent Results (from the past 2160 hour(s))  CBC with Differential/Platelet     Status: None   Collection Time: 02/12/19  9:55 AM  Result Value Ref Range   WBC 4.3 3.4 - 10.8 x10E3/uL   RBC 3.98 3.77 - 5.28 x10E6/uL   Hemoglobin 11.7 11.1 - 15.9 g/dL   Hematocrit 36.6 34.0 - 46.6 %   MCV 92 79 - 97 fL   MCH 29.4 26.6 - 33.0 pg   MCHC 32.0 31.5 - 35.7 g/dL   RDW 12.2 11.7 - 15.4 %   Platelets 212 150 - 450 x10E3/uL   Neutrophils 48 Not Estab. %   Lymphs 43 Not Estab. %   Monocytes 7 Not Estab. %   Eos 1  Not Estab. %   Basos 1 Not Estab. %   Neutrophils Absolute 2.1 1.4 - 7.0 x10E3/uL   Lymphocytes Absolute 1.9 0.7 - 3.1 x10E3/uL   Monocytes Absolute 0.3 0.1 - 0.9 x10E3/uL   EOS (ABSOLUTE) 0.0 0.0 - 0.4 x10E3/uL   Basophils Absolute 0.0 0.0 - 0.2 x10E3/uL   Immature  Granulocytes 0 Not Estab. %   Immature Grans (Abs) 0.0 0.0 - 0.1 x10E3/uL  Hgb A1c w/o eAG     Status: None   Collection Time: 02/12/19  9:55 AM  Result Value Ref Range   Hgb A1c MFr Bld 5.2 4.8 - 5.6 %    Comment:          Prediabetes: 5.7 - 6.4          Diabetes: >6.4          Glycemic control for adults with diabetes: <7.0   Vitamin B12     Status: None   Collection Time: 02/12/19  9:55 AM  Result Value Ref Range   Vitamin B-12 285 232 - 1,245 pg/mL  Iron, TIBC and Ferritin Panel     Status: None   Collection Time: 02/12/19  9:55 AM  Result Value Ref Range   Total Iron Binding Capacity 348 250 - 450 ug/dL   UIBC 280 131 - 425 ug/dL   Iron 68 27 - 159 ug/dL   Iron Saturation 20 15 - 55 %   Ferritin 15 15 - 150 ng/mL      PHQ2/9: Depression screen [facility] 2/9 04/15/2019 02/12/2019 01/07/2019 12/19/2018 08/14/2018  Decreased Interest 0 0 0 0 0  Down, Depressed, Hopeless 0 0 0 0 0  PHQ - 2 Score 0 0 0 0 0  Altered sleeping 0 0 0 0 -  Tired, decreased energy 0 0 0 0 -  Change in appetite 0 0 0 0 -  Feeling bad or failure about yourself  0 0 0 0 -  Trouble concentrating 0 0 0 0 -  Moving slowly or fidgety/restless 0 0 0 0 -  Suicidal thoughts 0 - 0 0 -  PHQ-9 Score 0 0 0 0 -  Difficult doing work/chores Not difficult at all - Not difficult at all Not difficult at all -    phq 9 is negative   Fall Risk: Fall Risk  04/15/2019 02/12/2019 01/07/2019 12/19/2018 08/14/2018  Falls in the past year? 0 0 0 0 0  Number falls in past yr: 0 0 0 0 0  Injury with Fall? 0 0 0 0 0  Follow up - Falls evaluation completed - Falls evaluation completed -      Functional Status Survey: Is the patient deaf or have difficulty  hearing?: No Does the patient have difficulty seeing, even when wearing glasses/contacts?: No Does the patient have difficulty concentrating, remembering, or making decisions?: No Does the patient have difficulty walking or climbing stairs?: No Does the patient have difficulty dressing or bathing?: No Does the patient have difficulty doing errands alone such as visiting a doctor's office or shopping?: No   Assessment & Plan  1. Other eczema  - Ambulatory referral to Dermatology  2. Skin lesion  - Ambulatory referral to Dermatology - second opinion   3. Vaginal discharge  - fluconazole (DIFLUCAN) 150 MG tablet; Take 1 tablet (150 mg total) by mouth every other day.  Dispense: 3 tablet; Refill: 0  4. Bilateral impacted cerumen  Verbal consent given Possible side effects discussed with patient Ears were  lavaged with warm water and peroxide  Patient tolerated procedure well No complications

## 2019-05-05 ENCOUNTER — Other Ambulatory Visit: Payer: Self-pay | Admitting: Family Medicine

## 2019-05-05 DIAGNOSIS — E538 Deficiency of other specified B group vitamins: Secondary | ICD-10-CM

## 2019-06-17 ENCOUNTER — Ambulatory Visit (INDEPENDENT_AMBULATORY_CARE_PROVIDER_SITE_OTHER): Payer: Managed Care, Other (non HMO) | Admitting: Family Medicine

## 2019-06-17 ENCOUNTER — Other Ambulatory Visit: Payer: Self-pay

## 2019-06-17 ENCOUNTER — Encounter: Payer: Self-pay | Admitting: Family Medicine

## 2019-06-17 DIAGNOSIS — L308 Other specified dermatitis: Secondary | ICD-10-CM | POA: Diagnosis not present

## 2019-06-17 DIAGNOSIS — Z79899 Other long term (current) drug therapy: Secondary | ICD-10-CM

## 2019-06-17 DIAGNOSIS — R21 Rash and other nonspecific skin eruption: Secondary | ICD-10-CM | POA: Diagnosis not present

## 2019-06-17 DIAGNOSIS — L299 Pruritus, unspecified: Secondary | ICD-10-CM | POA: Diagnosis not present

## 2019-06-17 MED ORDER — LEVOCETIRIZINE DIHYDROCHLORIDE 5 MG PO TABS: 5.0000 mg | ORAL_TABLET | Freq: Every evening | ORAL | 0 refills | Status: DC

## 2019-06-17 MED ORDER — TERBINAFINE HCL 250 MG PO TABS: 250.0000 mg | ORAL_TABLET | Freq: Every day | ORAL | 0 refills | Status: DC

## 2019-06-17 MED ORDER — HYDROXYZINE HCL 25 MG PO TABS: 25.0000 mg | ORAL_TABLET | Freq: Three times a day (TID) | ORAL | 0 refills | Status: DC | PRN

## 2019-06-17 NOTE — Progress Notes (Signed)
Name: Nicole Roman   MRN: [id number]    DOB: [birth year]   Date:06/17/2019       Progress Note  Subjective  Chief Complaint  Chief Complaint  Patient presents with  . Eczema    She is not sure if this is eczema. Not scheduled to see dermatologist until another month or so. Rash is red and itching. She has one on her feet and it peels. Spot on foot is getting bigger. None of the spots are active right now. She is wanting to know if Lamictal should be prescribed. Spots heal and create dark spots.     I connected with  Cheryll Cockayne  on 06/17/19 at  2:00 PM EST by a video enabled telemedicine application and verified that I am speaking with the correct person using two identifiers.  I discussed the limitations of evaluation and management by telemedicine and the availability of in person appointments. The patient expressed understanding and agreed to proceed. Staff also discussed with the patient that there may be a patient responsible charge related to this service. Patient Location: at work  Provider Location: [facility]   HPI   Rash : initially on right foot and left arm, she is using topical medication given by Dermatologist - Dr. Phillip Heal. She states symptoms getting worse, spreading to legs, it has episodes of flaring up, gets red, warm and very itchy for a couple of days every month, she is not sure of the trigger, she is worried about waiting until next visit in January for second opinion. She would like to try an anti-fungal. Explained we can give rx but needs liver enzymes first, discussed risk. Also discussed treating for allergies to see if symptoms improves and she agreed. No joint pains, or other systemic symptoms but we will check for inflammation     Patient Active Problem List   Diagnosis Date Noted  . B12 deficiency 08/14/2018  . ASCUS of cervix with negative high risk HPV 08/14/2018  . ASCUS favor benign 02/11/2016  . Ovarian cyst, right 02/09/2016  .  History of abnormal cervical Pap smear 02/08/2016  . Goiter 02/08/2016    Past Surgical History:  Procedure Laterality Date  . 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 Aretha Parrot with [facility] in [place]  . LEEP  2002  . tummy tuck  07/30/2017   Dr. Aretha Parrot with [facility] in [facility]    Family History  Problem Relation Age of Onset  . Cancer Mother        colon  . Hypertension Mother   . Diabetes Father   . Breast cancer Neg Hx     Social History   Socioeconomic History  . Marital status: Divorced    Spouse name: Not on file  . Number of children: 2  . Years of education: Not on file  . Highest education level: Some college, no degree  Occupational History  . Not on file  Social Needs  . Financial resource strain: Not hard at all  . Food insecurity    Worry: Never true    Inability: Never true  . Transportation needs    Medical: No    Non-medical: No  Tobacco Use  . Smoking status: Never Smoker  . Smokeless tobacco: Never Used  Substance and Sexual Activity  . Alcohol use: Yes    Comment: occasionally  . Drug use: No  . Sexual activity: Yes    Partners: Male    Birth control/protection: I.U.D.  Lifestyle  .  Physical activity    Days per week: 3 days    Minutes per session: 90 min  . Stress: Not at all  Relationships  . Social connections    Talks on phone: More than three times a week    Gets together: Twice a week    Attends religious service: Never    Active member of club or organization: No    Attends meetings of clubs or organizations: Never    Relationship status: Divorced  . Intimate partner violence    Fear of current or ex partner: No    Emotionally abused: No    Physically abused: No    Forced sexual activity: No  Other Topics Concern  . Not on file  Social History Narrative   Patient is originally from [place]   Dating for many years     Current Outpatient Medications:  .  clobetasol cream (TEMOVATE) 0.05 %, , Disp: ,  Rfl:  .  cyanocobalamin (,VITAMIN B-12,) 1000 MCG/ML injection, INJECT 1 ML (1,000 MCG TOTAL) INTO THE MUSCLE ONCE FOR 1 DOSE., Disp: 3 mL, Rfl: 1 .  hydroquinone 4 % cream, TAKE 1.0 APPLICATION(S) TOPICAL EVERY DAY BEFORE NOON, Disp: , Rfl:  .  levonorgestrel (MIRENA) 20 MCG/24HR IUD, 1 each by Intrauterine route once., Disp: , Rfl:  .  mometasone (ELOCON) 0.1 % cream, Apply 1 application topically daily as needed., Disp: , Rfl:  .  triamcinolone (KENALOG) 0.025 % cream, Apply 1 application topically 2 (two) times daily., Disp: , Rfl:  .  hydrOXYzine (ATARAX/VISTARIL) 25 MG tablet, Take 1 tablet (25 mg total) by mouth 3 (three) times daily as needed for itching., Disp: 90 tablet, Rfl: 0 .  levocetirizine (XYZAL) 5 MG tablet, Take 1 tablet (5 mg total) by mouth every evening., Disp: 90 tablet, Rfl: 0 .  terbinafine (LAMISIL) 250 MG tablet, Take 1 tablet (250 mg total) by mouth daily., Disp: 14 tablet, Rfl: 0  No Known Allergies  I personally reviewed active problem list, medication list, allergies, family history, social history, health maintenance with the patient/caregiver today.   ROS  Ten systems reviewed and is negative except as mentioned in HPI   Objective  Virtual encounter, vitals not obtained.  There is no height or weight on file to calculate BMI.  Physical Exam  Awake, alert and oriented Skin: large hyperpigmented area , dry on top , well demarcated on dorsal lateral aspect of right foot, also smaller round lesion on right calf  PHQ2/9: Depression screen [facility] 2/9 06/17/2019 04/15/2019 02/12/2019 01/07/2019 12/19/2018  Decreased Interest 0 0 0 0 0  Down, Depressed, Hopeless 0 0 0 0 0  PHQ - 2 Score 0 0 0 0 0  Altered sleeping 0 0 0 0 0  Tired, decreased energy 0 0 0 0 0  Change in appetite 0 0 0 0 0  Feeling bad or failure about yourself  0 0 0 0 0  Trouble concentrating 0 0 0 0 0  Moving slowly or fidgety/restless 0 0 0 0 0  Suicidal thoughts 0 0 - 0 0  PHQ-9 Score 0 0  0 0 0  Difficult doing work/chores - Not difficult at all - Not difficult at all Not difficult at all   PHQ-2/9 Result is negative.    Fall Risk: Fall Risk  06/17/2019 04/15/2019 02/12/2019 01/07/2019 12/19/2018  Falls in the past year? 0 0 0 0 0  Number falls in past yr: 0 0 0 0 0  Injury with  Fall? 0 0 0 0 0  Follow up - - Falls evaluation completed - Falls evaluation completed     Assessment & Plan  1. Other eczema  Seen by Dr. Phillip Heal and waiting for second opinion but states getting worse  2. Rash  - terbinafine (LAMISIL) 250 MG tablet; Take 1 tablet (250 mg total) by mouth daily.  Dispense: 14 tablet; Refill: 0 - ANA,IFA RA Diag Pnl w/rflx Tit/Patn - Sedimentation rate - C-reactive protein  3. Pruritus  - hydrOXYzine (ATARAX/VISTARIL) 25 MG tablet; Take 1 tablet (25 mg total) by mouth 3 (three) times daily as needed for itching.  Dispense: 90 tablet; Refill: 0 - levocetirizine (XYZAL) 5 MG tablet; Take 1 tablet (5 mg total) by mouth every evening.  Dispense: 90 tablet; Refill: 0 - ANA,IFA RA Diag Pnl w/rflx Tit/Patn - Sedimentation rate - C-reactive protein  4. Long-term use of high-risk medication  - COMPLETE METABOLIC PANEL WITH GFR - CBC with Differential/Platelet  I discussed the assessment and treatment plan with the patient. The patient was provided an opportunity to ask questions and all were answered. The patient agreed with the plan and demonstrated an understanding of the instructions.  The patient was advised to call back or seek an in-person evaluation if the symptoms worsen or if the condition fails to improve as anticipated.  I provided 15  minutes of non-face-to-face time during this encounter.

## 2019-06-26 LAB — COMPLETE METABOLIC PANEL WITH GFR
AG Ratio: 1.4 (calc) (ref 1.0–2.5)
ALT: 6 U/L (ref 6–29)
AST: 14 U/L (ref 10–30)
Albumin: 4 g/dL (ref 3.6–5.1)
Alkaline phosphatase (APISO): 77 U/L (ref 31–125)
BUN: 9 mg/dL (ref 7–25)
CO2: 27 mmol/L (ref 20–32)
Calcium: 9.3 mg/dL (ref 8.6–10.2)
Chloride: 107 mmol/L (ref 98–110)
Creat: 0.68 mg/dL (ref 0.50–1.10)
GFR, Est African American: 123 mL/min/{1.73_m2} (ref >=60)
GFR, Est Non African American: 106 mL/min/{1.73_m2} (ref >=60)
Globulin: 2.8 g/dL (calc) (ref 1.9–3.7)
Glucose, Bld: 84 mg/dL (ref 65–99)
Potassium: 4.4 mmol/L (ref 3.5–5.3)
Sodium: 140 mmol/L (ref 135–146)
Total Bilirubin: 0.9 mg/dL (ref 0.2–1.2)
Total Protein: 6.8 g/dL (ref 6.1–8.1)

## 2019-06-26 LAB — CBC WITH DIFFERENTIAL/PLATELET
Absolute Monocytes: 338 cells/uL (ref 200–950)
Basophils Absolute: 21 cells/uL (ref 0–200)
Basophils Relative: 0.4 %
Eosinophils Absolute: 42 cells/uL (ref 15–500)
Eosinophils Relative: 0.8 %
HCT: 34.9 % — ABNORMAL LOW (ref 35.0–45.0)
Hemoglobin: 11.4 g/dL — ABNORMAL LOW (ref 11.7–15.5)
Lymphs Abs: 2158 cells/uL (ref 850–3900)
MCH: 29.2 pg (ref 27.0–33.0)
MCHC: 32.7 g/dL (ref 32.0–36.0)
MCV: 89.5 fL (ref 80.0–100.0)
MPV: 11.8 fL (ref 7.5–12.5)
Monocytes Relative: 6.5 %
Neutro Abs: 2642 cells/uL (ref 1500–7800)
Neutrophils Relative %: 50.8 %
Platelets: 228 10*3/uL (ref 140–400)
RBC: 3.9 10*6/uL (ref 3.80–5.10)
RDW: 11.9 % (ref 11.0–15.0)
Total Lymphocyte: 41.5 %
WBC: 5.2 10*3/uL (ref 3.8–10.8)

## 2019-06-26 LAB — IRON,TIBC AND FERRITIN PANEL
%SAT: 18 % (calc) (ref 16–45)
Ferritin: 10 ng/mL — ABNORMAL LOW (ref 16–232)
Iron: 67 ug/dL (ref 40–190)
TIBC: 373 mcg/dL (calc) (ref 250–450)

## 2019-06-26 LAB — TEST AUTHORIZATION

## 2019-06-26 LAB — ANA,IFA RA DIAG PNL W/RFLX TIT/PATN
Anti Nuclear Antibody (ANA): NEGATIVE
Cyclic Citrullin Peptide Ab: <16 UNITS
Rheumatoid fact SerPl-aCnc: <14 IU/mL (ref <14)

## 2019-06-26 LAB — C-REACTIVE PROTEIN: CRP: 0.7 mg/L (ref <8.0)

## 2019-06-26 LAB — SEDIMENTATION RATE: Sed Rate: 9 mm/h (ref 0–20)

## 2019-06-30 ENCOUNTER — Other Ambulatory Visit: Payer: Self-pay | Admitting: Family Medicine

## 2019-06-30 DIAGNOSIS — D509 Iron deficiency anemia, unspecified: Secondary | ICD-10-CM

## 2019-06-30 MED ORDER — FERROUS SULFATE 325 (65 FE) MG PO TBEC: 325.0000 mg | DELAYED_RELEASE_TABLET | Freq: Three times a day (TID) | ORAL | 1 refills | Status: DC

## 2019-07-18 ENCOUNTER — Other Ambulatory Visit: Payer: Self-pay | Admitting: Family Medicine

## 2019-07-18 DIAGNOSIS — L299 Pruritus, unspecified: Secondary | ICD-10-CM

## 2019-07-18 NOTE — Telephone Encounter (Signed)
Requested medication (s) are due for refill today: no  Requested medication (s) are on the active medication list: yes  Last refill:  06/17/2019  Future visit scheduled: yes  Notes to clinic:  Requesting 90 day supply   Requested Prescriptions  Pending Prescriptions Disp Refills   hydrOXYzine (ATARAX/VISTARIL) 25 MG tablet [Pharmacy Med Name: HYDROXYZINE HCL 25 MG TABLET] 270 tablet 1    Sig: Take 1 tablet (25 mg total) by mouth 3 (three) times daily as needed for itching.      Ear, Nose, and Throat:  Antihistamines Passed - 07/18/2019 10:30 AM      Passed - Valid encounter within last 12 months    Recent Outpatient Visits           1 month ago [facility] Steele Sizer, MD   3 months ago Other [facility] Steele Sizer, MD   5 months ago B12 deficiency   [facility] Steele Sizer, MD   6 months ago Suspected Covid-19 Virus Infection   [place], FNP   7 months ago Acute cystitis without hematuria   [place], [place]       Future Appointments             In 4 weeks Steele Sizer, MD [facility], [facility]

## 2019-07-25 ENCOUNTER — Other Ambulatory Visit: Payer: Self-pay | Admitting: Family Medicine

## 2019-07-25 DIAGNOSIS — D509 Iron deficiency anemia, unspecified: Secondary | ICD-10-CM

## 2019-08-12 ENCOUNTER — Encounter: Payer: Self-pay | Admitting: Family Medicine

## 2019-08-16 ENCOUNTER — Encounter: Payer: Managed Care, Other (non HMO) | Admitting: Family Medicine

## 2019-09-11 ENCOUNTER — Ambulatory Visit (INDEPENDENT_AMBULATORY_CARE_PROVIDER_SITE_OTHER): Payer: Managed Care, Other (non HMO) | Admitting: Family Medicine

## 2019-09-11 ENCOUNTER — Other Ambulatory Visit: Payer: Self-pay

## 2019-09-11 ENCOUNTER — Encounter: Payer: Self-pay | Admitting: Family Medicine

## 2019-09-11 VITALS — BP 120/80 | HR 86 | Temp 97.7°F | Resp 16 | Ht 64.75 in | Wt 148.9 lb

## 2019-09-11 DIAGNOSIS — Z1159 Encounter for screening for other viral diseases: Secondary | ICD-10-CM

## 2019-09-11 DIAGNOSIS — Z800 Family history of malignant neoplasm of digestive organs: Secondary | ICD-10-CM | POA: Diagnosis not present

## 2019-09-11 DIAGNOSIS — D509 Iron deficiency anemia, unspecified: Secondary | ICD-10-CM

## 2019-09-11 DIAGNOSIS — E538 Deficiency of other specified B group vitamins: Secondary | ICD-10-CM

## 2019-09-11 DIAGNOSIS — Z0000 Encounter for general adult medical examination without abnormal findings: Secondary | ICD-10-CM | POA: Diagnosis not present

## 2019-09-11 DIAGNOSIS — Z1211 Encounter for screening for malignant neoplasm of colon: Secondary | ICD-10-CM

## 2019-09-11 DIAGNOSIS — Z131 Encounter for screening for diabetes mellitus: Secondary | ICD-10-CM

## 2019-09-11 DIAGNOSIS — Z13220 Encounter for screening for lipoid disorders: Secondary | ICD-10-CM

## 2019-09-11 NOTE — Progress Notes (Signed)
Name: Nicole Roman   MRN: [id number]    DOB: Apr 09, 1976   Date:09/11/2019       Progress Note  Subjective  Chief Complaint  Chief Complaint  Patient presents with  . Annual Exam  . Medication Refill    6 month F/U  . B12 deficiency  . Goiter    HPI  Patient presents for annual CPE and follow up  Skin eruptions: going on since 2020, evaluated by Dermatologist, tried multiple medications she was advised by Dermatologist - Dr. Phillip Heal to stop all oral supplementation and re-introduce it slowly to see if it was the cause of her symptoms, still has dark bumps and skin irritation, she had a biopsy , large hyperpigmented area on right foot   Anemia iron deficiency: at one point had to get blood transfusion, does not have a iron rich diet, but has a family history of colon cancer and also family history of anemia, she also has b12 deficiency, advised follow up with hematologist   Diet: she drinks almond milk and cheese, balanced diet but low in red meat Exercise: started back twice a week , increase to at least 3 times a week   USPSTF grade A and B recommendations    [facility] Visit from 09/11/2019 in [facility]  AUDIT-C Score  1     Depression: Phq 9 is  negative Depression screen [facility] 2/9 09/11/2019 06/17/2019 04/15/2019 02/12/2019 01/07/2019  Decreased Interest 0 0 0 0 0  Down, Depressed, Hopeless 0 0 0 0 0  PHQ - 2 Score 0 0 0 0 0  Altered sleeping 0 0 0 0 0  Tired, decreased energy 0 0 0 0 0  Change in appetite 0 0 0 0 0  Feeling bad or failure about yourself  0 0 0 0 0  Trouble concentrating 0 0 0 0 0  Moving slowly or fidgety/restless 0 0 0 0 0  Suicidal thoughts 0 0 0 - 0  PHQ-9 Score 0 0 0 0 0  Difficult doing work/chores - - Not difficult at all - Not difficult at all   Hypertension: BP Readings from Last 3 Encounters:  09/11/19 120/80  04/15/19 130/76  02/12/19 112/86   Obesity: Wt Readings from Last 3 Encounters:  09/11/19 148 lb 14.4 oz (67.5 kg)   04/15/19 149 lb 8 oz (67.8 kg)  02/12/19 147 lb 6.4 oz (66.9 kg)   BMI Readings from Last 3 Encounters:  09/11/19 24.97 kg/m  04/15/19 25.66 kg/m  02/12/19 25.30 kg/m     Hep C Screening: today  STD testing and prevention (HIV/chl/gon/syphilis): N/A Intimate partner violence: negative screen  Sexual History (Partners/Practices/Protection from [employer] hx STI/Pregnancy Plans): she has an IUD she has irregular cycles, mostly spotting  Pain during Intercourse: not currently sexually active  Menstrual History/LMP/Abnormal Bleeding: menarche around 44 yo, regular in the past, never heavy, spotting since IUD placed 5 years ago  Incontinence Symptoms: no problems  Breast cancer:  - Last Mammogram: repeat yearly  - BRCA gene screening: N/A  Osteoporosis: Discussed high calcium and vitamin D supplementation, weight bearing exercises  Cervical cancer screening: up to date   Skin cancer: Discussed monitoring for atypical lesions  Colorectal cancer: discussed screen at age 26    Advanced Care Planning: A voluntary discussion about advance care planning including the explanation and discussion of advance directives.  Discussed health care proxy and Living will, and the patient was able to identify a health care proxy as sister  Patient does not have a living will at present time.   Lipids: Lab Results  Component Value Date   CHOL 142 08/11/2018   CHOL 158 10/04/2017   CHOL 150 08/31/2016   Lab Results  Component Value Date   HDL 52 08/11/2018   HDL 47 10/04/2017   HDL 45 08/31/2016   Lab Results  Component Value Date   LDLCALC 83 08/11/2018   LDLCALC 105 (H) 10/04/2017   LDLCALC 99 08/31/2016   Lab Results  Component Value Date   TRIG 36 (A) 08/11/2018   TRIG 30 10/04/2017   TRIG 32 08/31/2016   Lab Results  Component Value Date   CHOLHDL 3.4 10/04/2017   CHOLHDL 3.3 08/31/2016   CHOLHDL 3.2 05/20/2015   No results found for: LDLDIRECT  Glucose: Glucose   Date Value Ref Range Status  06/30/2017 82 65 - 99 mg/dL Final  08/31/2016 94 65 - 99 mg/dL Final  05/20/2015 85 65 - 99 mg/dL Final   Glucose, Bld  Date Value Ref Range Status  06/19/2019 84 65 - 99 mg/dL Final    Comment:    .            Fasting reference interval .     Patient Active Problem List   Diagnosis Date Noted  . B12 deficiency 08/14/2018  . Ovarian cyst, right 02/09/2016  . Goiter 02/08/2016    Past Surgical History:  Procedure Laterality Date  . 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 Aretha Parrot with [facility] in [place]  . LEEP  2002  . tummy tuck  07/30/2017   Dr. Aretha Parrot with [facility] in [facility]    Family History  Problem Relation Age of Onset  . Cancer Mother        colon  . Hypertension Mother   . Diabetes Father   . Breast cancer Neg Hx     Social History   Socioeconomic History  . Marital status: Divorced    Spouse name: Not on file  . Number of children: 2  . Years of education: Not on file  . Highest education level: Some college, no degree  Occupational History  . Not on file  Tobacco Use  . Smoking status: Never Smoker  . Smokeless tobacco: Never Used  Substance and Sexual Activity  . Alcohol use: Yes    Comment: occasionally  . Drug use: No  . Sexual activity: Yes    Partners: Male    Birth control/protection: I.U.D.  Other Topics Concern  . Not on file  Social History Narrative   Patient is originally from [place]   Dating for many years   Social Determinants of Health   Financial Resource Strain: Low Risk   . Difficulty of Paying Living Expenses: Not hard at all  Food Insecurity: No Food Insecurity  . Worried About Charity fundraiser in the Last Year: Never true  . Ran Out of Food in the Last Year: Never true  Transportation Needs: No Transportation Needs  . Lack of Transportation (Medical): No  . Lack of Transportation (Non-Medical): No  Physical Activity: Insufficiently Active  . Days of  Exercise per Week: 2 days  . Minutes of Exercise per Session: 60 min  Stress: No Stress Concern Present  . Feeling of Stress : Not at all  Social Connections: Slightly Isolated  . Frequency of Communication with Friends and Family: More than three times a week  . Frequency of Social Gatherings with Friends and Family: More  than three times a week  . Attends Religious Services: More than 4 times per year  . Active Member of Clubs or Organizations: Yes  . Attends Archivist Meetings: More than 4 times per year  . Marital Status: Divorced  Human resources officer Violence: Not At Risk  . Fear of Current or Ex-Partner: No  . Emotionally Abused: No  . Physically Abused: No  . Sexually Abused: No     Current Outpatient Medications:  .  cyanocobalamin (,VITAMIN B-12,) 1000 MCG/ML injection, INJECT 1 ML (1,000 MCG TOTAL) INTO THE MUSCLE ONCE FOR 1 DOSE. (Patient not taking: Reported on 09/11/2019), Disp: 3 mL, Rfl: 1 .  ferrous sulfate 325 (65 FE) MG EC tablet, Take 1 tablet (325 mg total) by mouth 3 (three) times daily with meals. (Patient not taking: Reported on 09/11/2019), Disp: 90 tablet, Rfl: 1 .  levocetirizine (XYZAL) 5 MG tablet, Take 1 tablet (5 mg total) by mouth every evening. (Patient not taking: Reported on 09/11/2019), Disp: 90 tablet, Rfl: 0 .  levonorgestrel (MIRENA) 20 MCG/24HR IUD, 1 each by Intrauterine route once., Disp: , Rfl:   No Known Allergies   ROS  Constitutional: Negative for fever or weight change.  Respiratory: Negative for cough and shortness of breath.   Cardiovascular: Negative for chest pain or palpitations.  Gastrointestinal: Negative for abdominal pain, no bowel changes.  Musculoskeletal: Negative for gait problem or joint swelling.  Skin: Negative for rash.  Neurological: Negative for dizziness or headache.  No other specific complaints in a complete review of systems (except as listed in HPI above).  Objective  Vitals:   09/11/19 1050  BP:  120/80  Pulse: 86  Resp: 16  Temp: 97.7 F (36.5 C)  TempSrc: Temporal  SpO2: 98%  Weight: 148 lb 14.4 oz (67.5 kg)  Height: 5' 4.75" (1.645 m)    Body mass index is 24.97 kg/m.  Physical Exam  Constitutional: Patient appears well-developed and well-nourished. No distress.  HENT: Head: Normocephalic and atraumatic. Ears: B TMs ok, no erythema or effusion; Nose: Not dibe Mouth/Throat: not done  Eyes: Conjunctivae and EOM are normal. Pupils are equal, round, and reactive to light. No scleral icterus.  Neck: Normal range of motion. Neck supple. No JVD present. No thyromegaly present.  Cardiovascular: Normal rate, regular rhythm and normal heart sounds.  No murmur heard. No BLE edema. Pulmonary/Chest: Effort normal and breath sounds normal. No respiratory distress. Abdominal: Soft. Bowel sounds are normal, no distension. There is no tenderness. no masses Breast: no lumps or masses, no nipple discharge or rashes FEMALE GENITALIA:  External genitalia normal External urethra normal Vaginal walls normal , some blood ( recently finished cycle ), iud string may be in the cervical os, had [place] last year, not very visible Cervix normal without discharge or lesions Bimanual exam normal without masses RECTAL: not done Musculoskeletal: Normal range of motion, no joint effusions. No gross deformities Neurological: he is alert and oriented to person, place, and time. No cranial nerve deficit. Coordination, balance, strength, speech and gait are normal.  Skin: Skin is warm and dry. Area of hyperpigmentation on arm, right foot, some on trunk Psychiatric: Patient has a normal mood and affect. behavior is normal. Judgment and thought content normal.  Recent Results (from the past 2160 hour(s))  COMPLETE METABOLIC PANEL WITH GFR     Status: None   Collection Time: 06/19/19 12:00 AM  Result Value Ref Range   Glucose, Bld 84 65 - 99 mg/dL  Comment: .            Fasting reference interval .     BUN 9 7 - 25 mg/dL   Creat 0.68 0.50 - 1.10 mg/dL   GFR, Est Non African American 106 > OR = 60 mL/min/1.38m   GFR, Est African American 123 > OR = 60 mL/min/1.793m  BUN/Creatinine Ratio NOT APPLICABLE 6 - 22 (calc)   Sodium 140 135 - 146 mmol/L   Potassium 4.4 3.5 - 5.3 mmol/L   Chloride 107 98 - 110 mmol/L   CO2 27 20 - 32 mmol/L   Calcium 9.3 8.6 - 10.2 mg/dL   Total Protein 6.8 6.1 - 8.1 g/dL   Albumin 4.0 3.6 - 5.1 g/dL   Globulin 2.8 1.9 - 3.7 g/dL (calc)   AG Ratio 1.4 1.0 - 2.5 (calc)   Total Bilirubin 0.9 0.2 - 1.2 mg/dL   Alkaline phosphatase (APISO) 77 31 - 125 U/L   AST 14 10 - 30 U/L   ALT 6 6 - 29 U/L  CBC with Differential/Platelet     Status: Abnormal   Collection Time: 06/19/19 12:00 AM  Result Value Ref Range   WBC 5.2 3.8 - 10.8 Thousand/uL   RBC 3.90 3.80 - 5.10 Million/uL   Hemoglobin 11.4 (L) 11.7 - 15.5 g/dL   HCT 34.9 (L) 35.0 - 45.0 %   MCV 89.5 80.0 - 100.0 fL   MCH 29.2 27.0 - 33.0 pg   MCHC 32.7 32.0 - 36.0 g/dL   RDW 11.9 11.0 - 15.0 %   Platelets 228 140 - 400 Thousand/uL   MPV 11.8 7.5 - 12.5 fL   Neutro Abs 2,642 1,500 - 7,800 cells/uL   Lymphs Abs 2,158 850 - 3,900 cells/uL   Absolute Monocytes 338 200 - 950 cells/uL   Eosinophils Absolute 42 15 - 500 cells/uL   Basophils Absolute 21 0 - 200 cells/uL   Neutrophils Relative % 50.8 %   Total Lymphocyte 41.5 %   Monocytes Relative 6.5 %   Eosinophils Relative 0.8 %   Basophils Relative 0.4 %  ANA,IFA RA Diag Pnl w/rflx Tit/Patn     Status: None   Collection Time: 06/19/19 12:00 AM  Result Value Ref Range   Anti Nuclear Antibody (ANA) NEGATIVE NEGATIVE    Comment: ANA IFA is a first line screen for detecting the presence of up to approximately 150 autoantibodies in various autoimmune diseases. A negative ANA IFA result suggests an ANA-associated autoimmune disease is not present at this time, but is not definitive. If there is high clinical suspicion for Sjogren's syndrome, testing for  anti-SS-A/Ro antibody should be considered. Anti-Jo-1 antibody should be considered for clinically suspected inflammatory myopathies. . AC-0: Negative . International Consensus on ANA Patterns ([email or web address]://www.hernandez-[email or web address]/. For additional information, please refer to http://education.[facility].com/faq/FAQ177 (This link is being provided for informational/ educational purposes only.) .    Rhuematoid fact SerPl-aCnc <1<831<25U/mL   Cyclic Citrullin Peptide Ab <16 UNITS    Comment: Reference Range Negative:            <20 Weak Positive:       20-39 Moderate Positive:   40-59 Strong Positive:     >59 .    INTERPRETATION      Comment: . There is no serologic evidence for rheumatoid arthritis. The RF and CCP tests each have a sensitivity for established rheumatoid arthritis of approximately 65-70%. .   Sedimentation rate     Status: None  Collection Time: 06/19/19 12:00 AM  Result Value Ref Range   Sed Rate 9 0 - 20 mm/h  C-reactive protein     Status: None   Collection Time: 06/19/19 12:00 AM  Result Value Ref Range   CRP 0.7 <8.0 mg/L  Iron, TIBC and Ferritin Panel     Status: Abnormal   Collection Time: 06/19/19 12:00 AM  Result Value Ref Range   Iron 67 40 - 190 mcg/dL   TIBC 373 250 - 450 mcg/dL (calc)   %SAT 18 16 - 45 % (calc)   Ferritin 10 (L) 16 - 232 ng/mL  TEST AUTHORIZATION     Status: None   Collection Time: 06/19/19 12:00 AM  Result Value Ref Range   TEST NAME: IRON, TIBC AND FERRITIN PANEL    TEST CODE: [id number]    CLIENT CONTACT: Nanda Bittick    REPORT ALWAYS MESSAGE SIGNATURE      Comment: . The laboratory testing on this patient was verbally requested or confirmed by the ordering physician or his or her authorized representative after contact with an employee of [employer]. Federal regulations require that we maintain on file written authorization for all laboratory testing.  Accordingly we are asking that the  ordering physician or his or her authorized representative sign a copy of this report and promptly return it to the client service representative. . . Signature:____________________________________________________ . Please fax this signed page to [phone] or return it via your [employer] courier.       Fall Risk: Fall Risk  09/11/2019 06/17/2019 04/15/2019 02/12/2019 01/07/2019  Falls in the past year? 0 0 0 0 0  Number falls in past yr: 0 0 0 0 0  Injury with Fall? 0 0 0 0 0  Follow up - - - Falls evaluation completed -     Functional Status Survey: Is the patient deaf or have difficulty hearing?: No Does the patient have difficulty seeing, even when wearing glasses/contacts?: No Does the patient have difficulty concentrating, remembering, or making decisions?: No Does the patient have difficulty walking or climbing stairs?: No Does the patient have difficulty dressing or bathing?: No Does the patient have difficulty doing errands alone such as visiting a doctor's office or shopping?: No   Assessment & Plan  1. Well adult exam  - Ambulatory referral to Gastroenterology  2. Family history of colon cancer  - Ambulatory referral to Gastroenterology  3. Colon cancer screening  - Ambulatory referral to Gastroenterology  4. Iron deficiency anemia, unspecified iron deficiency anemia type  - CBC with Differential/Platelet - Iron, TIBC and Ferritin Panel - Ambulatory referral to Hematology  5. B12 deficiency  - Vitamin B12 - Ambulatory referral to Hematology  6. Need for hepatitis C screening test  - Hepatitis C Antibody  7. Diabetes mellitus screening  - Hemoglobin A1c  8. Lipid screening  - Lipid panel  -USPSTF grade A and B recommendations reviewed with patient; age-appropriate recommendations, preventive care, screening tests, etc discussed and encouraged; healthy living encouraged; see AVS for patient education given to patient -Discussed  importance of 150 minutes of physical activity weekly, eat two servings of fish weekly, eat one serving of tree nuts ( cashews, pistachios, pecans, almonds.Marland Kitchen) every other day, eat 6 servings of fruit/vegetables daily and drink plenty of water and avoid sweet beverages.

## 2019-09-12 ENCOUNTER — Telehealth: Payer: Self-pay

## 2019-09-12 ENCOUNTER — Other Ambulatory Visit: Payer: Self-pay

## 2019-09-12 DIAGNOSIS — Z800 Family history of malignant neoplasm of digestive organs: Secondary | ICD-10-CM

## 2019-09-12 DIAGNOSIS — Z1211 Encounter for screening for malignant neoplasm of colon: Secondary | ICD-10-CM

## 2019-09-12 LAB — IRON,TIBC AND FERRITIN PANEL
%SAT: 24 % (calc) (ref 16–45)
Ferritin: 17 ng/mL (ref 16–232)
Iron: 85 ug/dL (ref 40–190)
TIBC: 352 mcg/dL (calc) (ref 250–450)

## 2019-09-12 LAB — LIPID PANEL
Cholesterol: 160 mg/dL (ref <200)
HDL: 52 mg/dL (ref >=50)
LDL Cholesterol (Calc): 98 mg/dL (calc)
Non-HDL Cholesterol (Calc): 108 mg/dL (calc) (ref <130)
Total CHOL/HDL Ratio: 3.1 (calc) (ref <5.0)
Triglycerides: 35 mg/dL (ref <150)

## 2019-09-12 LAB — CBC WITH DIFFERENTIAL/PLATELET
Absolute Monocytes: 362 cells/uL (ref 200–950)
Basophils Absolute: 22 cells/uL (ref 0–200)
Basophils Relative: 0.4 %
Eosinophils Absolute: 49 cells/uL (ref 15–500)
Eosinophils Relative: 0.9 %
HCT: 38.7 % (ref 35.0–45.0)
Hemoglobin: 12.8 g/dL (ref 11.7–15.5)
Lymphs Abs: 1998 cells/uL (ref 850–3900)
MCH: 29.9 pg (ref 27.0–33.0)
MCHC: 33.1 g/dL (ref 32.0–36.0)
MCV: 90.4 fL (ref 80.0–100.0)
MPV: 11.5 fL (ref 7.5–12.5)
Monocytes Relative: 6.7 %
Neutro Abs: 2970 cells/uL (ref 1500–7800)
Neutrophils Relative %: 55 %
Platelets: 216 10*3/uL (ref 140–400)
RBC: 4.28 10*6/uL (ref 3.80–5.10)
RDW: 11.9 % (ref 11.0–15.0)
Total Lymphocyte: 37 %
WBC: 5.4 10*3/uL (ref 3.8–10.8)

## 2019-09-12 LAB — VITAMIN B12: Vitamin B-12: 331 pg/mL (ref 200–1100)

## 2019-09-12 LAB — HEMOGLOBIN A1C
Hgb A1c MFr Bld: 5.3 % of total Hgb (ref <5.7)
Mean Plasma Glucose: 105 (calc)
eAG (mmol/L): 5.8 (calc)

## 2019-09-12 LAB — HEPATITIS C ANTIBODY
Hepatitis C Ab: NONREACTIVE
SIGNAL TO CUT-OFF: 0.02 (ref <1.00)

## 2019-09-12 NOTE — Telephone Encounter (Signed)
Gastroenterology Pre-Procedure Review  Request Date: Wed 01/15/20 Requesting Physician: Dr. Marius Ditch  PATIENT REVIEW QUESTIONS: The patient responded to the following health history questions as indicated:    1. Are you having any GI issues? no 2. Do you have a personal history of Polyps? no 3. Do you have a family history of Colon Cancer or Polyps? yes (mother colon cancer) 4. Diabetes Mellitus? no 5. Joint replacements in the past 12 months?no 6. Major health problems in the past 3 months?no 7. Any artificial heart valves, MVP, or defibrillator?no    MEDICATIONS & ALLERGIES:    Patient reports the following regarding taking any anticoagulation/antiplatelet therapy:   Plavix, Coumadin, Eliquis, Xarelto, Lovenox, Pradaxa, Brilinta, or Effient? no Aspirin? no  Patient confirms/reports the following medications:  Current Outpatient Medications  Medication Sig Dispense Refill  . cyanocobalamin (,VITAMIN B-12,) 1000 MCG/ML injection INJECT 1 ML (1,000 MCG TOTAL) INTO THE MUSCLE ONCE FOR 1 DOSE. (Patient not taking: Reported on 09/11/2019) 3 mL 1  . ferrous sulfate 325 (65 FE) MG EC tablet Take 1 tablet (325 mg total) by mouth 3 (three) times daily with meals. (Patient not taking: Reported on 09/11/2019) 90 tablet 1  . levocetirizine (XYZAL) 5 MG tablet Take 1 tablet (5 mg total) by mouth every evening. (Patient not taking: Reported on 09/11/2019) 90 tablet 0  . levonorgestrel (MIRENA) 20 MCG/24HR IUD 1 each by Intrauterine route once.     No current facility-administered medications for this visit.    Patient confirms/reports the following allergies:  No Known Allergies  No orders of the defined types were placed in this encounter.   AUTHORIZATION INFORMATION Primary Insurance: 1D#: Group #:  Secondary Insurance: 1D#: Group #:  SCHEDULE INFORMATION: Date: 01/15/20 Time: Location:[facility]

## 2019-09-13 ENCOUNTER — Encounter: Payer: Self-pay | Admitting: Family Medicine

## 2019-09-25 ENCOUNTER — Encounter: Payer: Managed Care, Other (non HMO) | Admitting: Oncology

## 2019-09-30 ENCOUNTER — Ambulatory Visit: Payer: Managed Care, Other (non HMO) | Admitting: Certified Nurse Midwife

## 2019-09-30 ENCOUNTER — Encounter: Payer: Self-pay | Admitting: Certified Nurse Midwife

## 2019-09-30 ENCOUNTER — Other Ambulatory Visit: Payer: Self-pay

## 2019-09-30 VITALS — BP 136/68 | HR 62 | Ht 64.75 in | Wt 153.6 lb

## 2019-09-30 DIAGNOSIS — Z975 Presence of (intrauterine) contraceptive device: Secondary | ICD-10-CM

## 2019-09-30 DIAGNOSIS — N938 Other specified abnormal uterine and vaginal bleeding: Secondary | ICD-10-CM

## 2019-09-30 DIAGNOSIS — Z538 Procedure and treatment not carried out for other reasons: Secondary | ICD-10-CM

## 2019-09-30 DIAGNOSIS — T8332XD Displacement of intrauterine contraceptive device, subsequent encounter: Secondary | ICD-10-CM

## 2019-09-30 DIAGNOSIS — T8332XA Displacement of intrauterine contraceptive device, initial encounter: Secondary | ICD-10-CM

## 2019-09-30 NOTE — Patient Instructions (Signed)
Levonorgestrel intrauterine device (IUD) What is this medicine? LEVONORGESTREL IUD (LEE voe nor jes trel) is a contraceptive (birth control) device. The device is placed inside the uterus by a healthcare professional. It is used to prevent pregnancy. This device can also be used to treat heavy bleeding that occurs during your period. This medicine may be used for other purposes; ask your health care provider or pharmacist if you have questions. COMMON BRAND NAME(S): Kyleena, LILETTA, Mirena, Skyla What should I tell my health care provider before I take this medicine? They need to know if you have any of these conditions:  abnormal Pap smear  cancer of the breast, uterus, or cervix  diabetes  endometritis  genital or pelvic infection now or in the past  have more than one sexual partner or your partner has more than one partner  heart disease  history of an ectopic or tubal pregnancy  immune system problems  IUD in place  liver disease or tumor  problems with blood clots or take blood-thinners  seizures  use intravenous drugs  uterus of unusual shape  vaginal bleeding that has not been explained  an unusual or allergic reaction to levonorgestrel, other hormones, silicone, or polyethylene, medicines, foods, dyes, or preservatives  pregnant or trying to get pregnant  breast-feeding How should I use this medicine? This device is placed inside the uterus by a health care professional. Talk to your pediatrician regarding the use of this medicine in children. Special care may be needed. Overdosage: If you think you have taken too much of this medicine contact a poison control center or emergency room at once. NOTE: This medicine is only for you. Do not share this medicine with others. What if I miss a dose? This does not apply. Depending on the brand of device you have inserted, the device will need to be replaced every 3 to 6 years if you wish to continue using this type  of birth control. What may interact with this medicine? Do not take this medicine with any of the following medications:  amprenavir  bosentan  fosamprenavir This medicine may also interact with the following medications:  aprepitant  armodafinil  barbiturate medicines for inducing sleep or treating seizures  bexarotene  boceprevir  griseofulvin  medicines to treat seizures like carbamazepine, ethotoin, felbamate, oxcarbazepine, phenytoin, topiramate  modafinil  pioglitazone  rifabutin  rifampin  rifapentine  some medicines to treat HIV infection like atazanavir, efavirenz, indinavir, lopinavir, nelfinavir, tipranavir, ritonavir  St. John's wort  warfarin This list may not describe all possible interactions. Give your health care provider a list of all the medicines, herbs, non-prescription drugs, or dietary supplements you use. Also tell them if you smoke, drink alcohol, or use illegal drugs. Some items may interact with your medicine. What should I watch for while using this medicine? Visit your doctor or health care professional for regular check ups. See your doctor if you or your partner has sexual contact with others, becomes HIV positive, or gets a sexual transmitted disease. This product does not protect you against HIV infection (AIDS) or other sexually transmitted diseases. You can check the placement of the IUD yourself by reaching up to the top of your vagina with clean fingers to feel the threads. Do not pull on the threads. It is a good habit to check placement after each menstrual period. Call your doctor right away if you feel more of the IUD than just the threads or if you cannot feel the threads at   all. The IUD may come out by itself. You may become pregnant if the device comes out. If you notice that the IUD has come out use a backup birth control method like condoms and call your health care provider. Using tampons will not change the position of the  IUD and are okay to use during your period. This IUD can be safely scanned with magnetic resonance imaging (MRI) only under specific conditions. Before you have an MRI, tell your healthcare provider that you have an IUD in place, and which type of IUD you have in place. What side effects may I notice from receiving this medicine? Side effects that you should report to your doctor or health care professional as soon as possible:  allergic reactions like skin rash, itching or hives, swelling of the face, lips, or tongue  fever, flu-like symptoms  genital sores  high blood pressure  no menstrual period for 6 weeks during use  pain, swelling, warmth in the leg  pelvic pain or tenderness  severe or sudden headache  signs of pregnancy  stomach cramping  sudden shortness of breath  trouble with balance, talking, or walking  unusual vaginal bleeding, discharge  yellowing of the eyes or skin Side effects that usually do not require medical attention (report to your doctor or health care professional if they continue or are bothersome):  acne  breast pain  change in sex drive or performance  changes in weight  cramping, dizziness, or faintness while the device is being inserted  headache  irregular menstrual bleeding within first 3 to 6 months of use  nausea This list may not describe all possible side effects. Call your doctor for medical advice about side effects. You may report side effects to FDA at 1-800-FDA-1088. Where should I keep my medicine? This does not apply. NOTE: This sheet is a summary. It may not cover all possible information. If you have questions about this medicine, talk to your doctor, pharmacist, or health care provider.  2020 Elsevier/Gold Standard (2018-05-15 13:22:01)  

## 2019-10-01 NOTE — Progress Notes (Signed)
Mirena Removal Note  Nicole Roman is a 44 y.o. year old [id number] Black female who presents for removal and replacement of her Mirena IUD. Her Mirena IUD was placed in 2016 by Lorelle Gibbs, CNM.   BP 136/68   Pulse 62   Ht 5' 4.75" (1.645 m)   Wt 153 lb 9 oz (69.7 kg)   LMP 09/28/2019 (Exact Date)   BMI 25.75 kg/m   Time out was performed.  A small plastic speculum was placed in the vagina.  The cervix was visualized, and the strings were NOT visible. Attempted to visualize strings with use of bozeman and IUD hook. Procedure discontinued due to bleeding from cervical os.  Advised patient to return to office for ultrasound guided IUD removal and replacement of Mirena IUD.   Reviewed red flag symptoms and when to call.   RTC as indicated above.    Diona Fanti, CNM [facility], [facility]

## 2019-10-10 ENCOUNTER — Ambulatory Visit (INDEPENDENT_AMBULATORY_CARE_PROVIDER_SITE_OTHER): Payer: Managed Care, Other (non HMO)

## 2019-10-10 ENCOUNTER — Other Ambulatory Visit: Payer: Self-pay

## 2019-10-10 ENCOUNTER — Ambulatory Visit (INDEPENDENT_AMBULATORY_CARE_PROVIDER_SITE_OTHER): Payer: Managed Care, Other (non HMO) | Admitting: Certified Nurse Midwife

## 2019-10-10 ENCOUNTER — Encounter: Payer: Self-pay | Admitting: Certified Nurse Midwife

## 2019-10-10 VITALS — BP 130/86 | HR 69 | Ht 64.75 in | Wt 151.1 lb

## 2019-10-10 DIAGNOSIS — Z975 Presence of (intrauterine) contraceptive device: Secondary | ICD-10-CM

## 2019-10-10 DIAGNOSIS — T8332XD Displacement of intrauterine contraceptive device, subsequent encounter: Secondary | ICD-10-CM | POA: Diagnosis not present

## 2019-10-10 DIAGNOSIS — Z538 Procedure and treatment not carried out for other reasons: Secondary | ICD-10-CM

## 2019-10-10 DIAGNOSIS — Z30433 Encounter for removal and reinsertion of intrauterine contraceptive device: Secondary | ICD-10-CM | POA: Diagnosis not present

## 2019-10-10 NOTE — Patient Instructions (Signed)
IUD PLACEMENT POST-PROCEDURE INSTRUCTIONS  1. You may take Ibuprofen, Aleve or Tylenol for pain if needed.  Cramping should resolve within in 24 hours.  2. You may have a small amount of spotting.  You should wear a mini pad for the next few days.  3. You may have intercourse after 72 hours.  If you using this for birth control, it is effective immediately.  4. You need to call if you have any pelvic pain, fever, heavy bleeding or foul smelling vaginal discharge.  Irregular bleeding is common the first several months after having an IUD placed. You do not need to call for this reason unless you are concerned.  5. Shower or bathe as normal  6. You should have a follow-up appointment in 4-8 weeks for a re-check to make sure you are not having any problems.   Levonorgestrel intrauterine device (IUD) What is this medicine? LEVONORGESTREL IUD (LEE voe nor jes trel) is a contraceptive (birth control) device. The device is placed inside the uterus by a healthcare professional. It is used to prevent pregnancy. This device can also be used to treat heavy bleeding that occurs during your period. This medicine may be used for other purposes; ask your health care provider or pharmacist if you have questions. COMMON BRAND NAME(S): Kyleena, LILETTA, Mirena, Skyla What should I tell my health care provider before I take this medicine? They need to know if you have any of these conditions:  abnormal Pap smear  cancer of the breast, uterus, or cervix  diabetes  endometritis  genital or pelvic infection now or in the past  have more than one sexual partner or your partner has more than one partner  heart disease  history of an ectopic or tubal pregnancy  immune system problems  IUD in place  liver disease or tumor  problems with blood clots or take blood-thinners  seizures  use intravenous drugs  uterus of unusual shape  vaginal bleeding that has not been explained  an unusual or  allergic reaction to levonorgestrel, other hormones, silicone, or polyethylene, medicines, foods, dyes, or preservatives  pregnant or trying to get pregnant  breast-feeding How should I use this medicine? This device is placed inside the uterus by a health care professional. Talk to your pediatrician regarding the use of this medicine in children. Special care may be needed. Overdosage: If you think you have taken too much of this medicine contact a poison control center or emergency room at once. NOTE: This medicine is only for you. Do not share this medicine with others. What if I miss a dose? This does not apply. Depending on the brand of device you have inserted, the device will need to be replaced every 3 to 6 years if you wish to continue using this type of birth control. What may interact with this medicine? Do not take this medicine with any of the following medications:  amprenavir  bosentan  fosamprenavir This medicine may also interact with the following medications:  aprepitant  armodafinil  barbiturate medicines for inducing sleep or treating seizures  bexarotene  boceprevir  griseofulvin  medicines to treat seizures like carbamazepine, ethotoin, felbamate, oxcarbazepine, phenytoin, topiramate  modafinil  pioglitazone  rifabutin  rifampin  rifapentine  some medicines to treat HIV infection like atazanavir, efavirenz, indinavir, lopinavir, nelfinavir, tipranavir, ritonavir  St. John's wort  warfarin This list may not describe all possible interactions. Give your health care provider a list of all the medicines, herbs, non-prescription drugs, or   dietary supplements you use. Also tell them if you smoke, drink alcohol, or use illegal drugs. Some items may interact with your medicine. What should I watch for while using this medicine? Visit your doctor or health care professional for regular check ups. See your doctor if you or your partner has sexual  contact with others, becomes HIV positive, or gets a sexual transmitted disease. This product does not protect you against HIV infection (AIDS) or other sexually transmitted diseases. You can check the placement of the IUD yourself by reaching up to the top of your vagina with clean fingers to feel the threads. Do not pull on the threads. It is a good habit to check placement after each menstrual period. Call your doctor right away if you feel more of the IUD than just the threads or if you cannot feel the threads at all. The IUD may come out by itself. You may become pregnant if the device comes out. If you notice that the IUD has come out use a backup birth control method like condoms and call your health care provider. Using tampons will not change the position of the IUD and are okay to use during your period. This IUD can be safely scanned with magnetic resonance imaging (MRI) only under specific conditions. Before you have an MRI, tell your healthcare provider that you have an IUD in place, and which type of IUD you have in place. What side effects may I notice from receiving this medicine? Side effects that you should report to your doctor or health care professional as soon as possible:  allergic reactions like skin rash, itching or hives, swelling of the face, lips, or tongue  fever, flu-like symptoms  genital sores  high blood pressure  no menstrual period for 6 weeks during use  pain, swelling, warmth in the leg  pelvic pain or tenderness  severe or sudden headache  signs of pregnancy  stomach cramping  sudden shortness of breath  trouble with balance, talking, or walking  unusual vaginal bleeding, discharge  yellowing of the eyes or skin Side effects that usually do not require medical attention (report to your doctor or health care professional if they continue or are bothersome):  acne  breast pain  change in sex drive or performance  changes in  weight  cramping, dizziness, or faintness while the device is being inserted  headache  irregular menstrual bleeding within first 3 to 6 months of use  nausea This list may not describe all possible side effects. Call your doctor for medical advice about side effects. You may report side effects to FDA at 1-800-FDA-1088. Where should I keep my medicine? This does not apply. NOTE: This sheet is a summary. It may not cover all possible information. If you have questions about this medicine, talk to your doctor, pharmacist, or health care provider.  2020 Elsevier/Gold Standard (2018-05-15 13:22:01)  

## 2019-10-10 NOTE — Progress Notes (Signed)
Nicole Roman is a 44 y.o. year old [id number] African American female who presents for removal and replacement of a Mirena IUD. She was given informed consent for removal and reinsertion of her Mirena. Her Mirena was placed in 2016 by Lorelle Gibbs, CNM.  The risks and benefits of the method and placement have been thouroughly reviewed with the patient and all questions were answered.  Specifically the patient is aware of failure rate of 07/998, expulsion of the IUD and of possible perforation.  The patient is aware of irregular bleeding due to the method and understands the incidence of irregular bleeding diminishes with time.  Signed copy of informed consent in chart.   Patient's last menstrual period was 09/28/2019 (exact date). BP 130/86   Pulse 69   Ht 5' 4.75" (1.645 m)   Wt 151 lb 1 oz (68.5 kg)   LMP 09/28/2019 (Exact Date)   BMI 25.33 kg/m    Sonogram was performed to verify the proper placement of the IUD was verified via transvaginal u/s. Appropriate time out taken.  A medium plastic speculum was placed in the vagina.  The cervix was visualized, prepped using Betadine. The strings were NOT visible. Guided by abdominal ultrasound and with the use of IUD hook and the assistance of Dr. Amalia Hailey the IUD was Mirena was removed.   The uterus was found to sounded to 9 cm.  Mirena IUD placed per manufacturer's recommendations without complications. The strings were trimmed to 3 cm.  The patient tolerated the procedure well.   The patient was given post procedure instructions, including signs and symptoms of infection and to check for the strings after each menses or each month, and refraining from intercourse or anything in the vagina for 3 days.  She was given a Mirena care card with date Mirena placed, and date Mirena to be removed.  Reviewed red flag symptoms and when to call.   RTC x 4-8 weeks for IUD string check or sooner if needed.    Diona Fanti, CNM [facility], [facility] 10/10/19 1:26 PM   NDC: [id number]: [id number] Exp: 11/2021

## 2019-11-04 ENCOUNTER — Telehealth: Payer: Self-pay

## 2019-11-04 NOTE — Telephone Encounter (Signed)
Trish called and stated that Dr. Marius Ditch is not scoping on 01/15/2020. Informed her to move patient to Dr. Bonna Gains

## 2019-11-07 ENCOUNTER — Encounter: Payer: Self-pay | Admitting: Certified Nurse Midwife

## 2019-11-07 ENCOUNTER — Other Ambulatory Visit: Payer: Self-pay

## 2019-11-07 ENCOUNTER — Ambulatory Visit (INDEPENDENT_AMBULATORY_CARE_PROVIDER_SITE_OTHER): Payer: Managed Care, Other (non HMO) | Admitting: Certified Nurse Midwife

## 2019-11-07 VITALS — BP 135/63 | HR 63 | Ht 64.0 in | Wt 153.3 lb

## 2019-11-07 DIAGNOSIS — Z975 Presence of (intrauterine) contraceptive device: Secondary | ICD-10-CM | POA: Diagnosis not present

## 2019-11-07 DIAGNOSIS — Z30431 Encounter for routine checking of intrauterine contraceptive device: Secondary | ICD-10-CM

## 2019-11-07 NOTE — Patient Instructions (Signed)
Levonorgestrel intrauterine device (IUD) What is this medicine? LEVONORGESTREL IUD (LEE voe nor jes trel) is a contraceptive (birth control) device. The device is placed inside the uterus by a healthcare professional. It is used to prevent pregnancy. This device can also be used to treat heavy bleeding that occurs during your period. This medicine may be used for other purposes; ask your health care provider or pharmacist if you have questions. COMMON BRAND NAME(S): Kyleena, LILETTA, Mirena, Skyla What should I tell my health care provider before I take this medicine? They need to know if you have any of these conditions:  abnormal Pap smear  cancer of the breast, uterus, or cervix  diabetes  endometritis  genital or pelvic infection now or in the past  have more than one sexual partner or your partner has more than one partner  heart disease  history of an ectopic or tubal pregnancy  immune system problems  IUD in place  liver disease or tumor  problems with blood clots or take blood-thinners  seizures  use intravenous drugs  uterus of unusual shape  vaginal bleeding that has not been explained  an unusual or allergic reaction to levonorgestrel, other hormones, silicone, or polyethylene, medicines, foods, dyes, or preservatives  pregnant or trying to get pregnant  breast-feeding How should I use this medicine? This device is placed inside the uterus by a health care professional. Talk to your pediatrician regarding the use of this medicine in children. Special care may be needed. Overdosage: If you think you have taken too much of this medicine contact a poison control center or emergency room at once. NOTE: This medicine is only for you. Do not share this medicine with others. What if I miss a dose? This does not apply. Depending on the brand of device you have inserted, the device will need to be replaced every 3 to 6 years if you wish to continue using this type  of birth control. What may interact with this medicine? Do not take this medicine with any of the following medications:  amprenavir  bosentan  fosamprenavir This medicine may also interact with the following medications:  aprepitant  armodafinil  barbiturate medicines for inducing sleep or treating seizures  bexarotene  boceprevir  griseofulvin  medicines to treat seizures like carbamazepine, ethotoin, felbamate, oxcarbazepine, phenytoin, topiramate  modafinil  pioglitazone  rifabutin  rifampin  rifapentine  some medicines to treat HIV infection like atazanavir, efavirenz, indinavir, lopinavir, nelfinavir, tipranavir, ritonavir  St. John's wort  warfarin This list may not describe all possible interactions. Give your health care provider a list of all the medicines, herbs, non-prescription drugs, or dietary supplements you use. Also tell them if you smoke, drink alcohol, or use illegal drugs. Some items may interact with your medicine. What should I watch for while using this medicine? Visit your doctor or health care professional for regular check ups. See your doctor if you or your partner has sexual contact with others, becomes HIV positive, or gets a sexual transmitted disease. This product does not protect you against HIV infection (AIDS) or other sexually transmitted diseases. You can check the placement of the IUD yourself by reaching up to the top of your vagina with clean fingers to feel the threads. Do not pull on the threads. It is a good habit to check placement after each menstrual period. Call your doctor right away if you feel more of the IUD than just the threads or if you cannot feel the threads at   all. The IUD may come out by itself. You may become pregnant if the device comes out. If you notice that the IUD has come out use a backup birth control method like condoms and call your health care provider. Using tampons will not change the position of the  IUD and are okay to use during your period. This IUD can be safely scanned with magnetic resonance imaging (MRI) only under specific conditions. Before you have an MRI, tell your healthcare provider that you have an IUD in place, and which type of IUD you have in place. What side effects may I notice from receiving this medicine? Side effects that you should report to your doctor or health care professional as soon as possible:  allergic reactions like skin rash, itching or hives, swelling of the face, lips, or tongue  fever, flu-like symptoms  genital sores  high blood pressure  no menstrual period for 6 weeks during use  pain, swelling, warmth in the leg  pelvic pain or tenderness  severe or sudden headache  signs of pregnancy  stomach cramping  sudden shortness of breath  trouble with balance, talking, or walking  unusual vaginal bleeding, discharge  yellowing of the eyes or skin Side effects that usually do not require medical attention (report to your doctor or health care professional if they continue or are bothersome):  acne  breast pain  change in sex drive or performance  changes in weight  cramping, dizziness, or faintness while the device is being inserted  headache  irregular menstrual bleeding within first 3 to 6 months of use  nausea This list may not describe all possible side effects. Call your doctor for medical advice about side effects. You may report side effects to FDA at 1-800-FDA-1088. Where should I keep my medicine? This does not apply. NOTE: This sheet is a summary. It may not cover all possible information. If you have questions about this medicine, talk to your doctor, pharmacist, or health care provider.  2020 Elsevier/Gold Standard (2018-05-15 13:22:01)  

## 2019-11-08 DIAGNOSIS — Z975 Presence of (intrauterine) contraceptive device: Secondary | ICD-10-CM | POA: Insufficient documentation

## 2019-11-08 NOTE — Progress Notes (Signed)
  GYNECOLOGY OFFICE ENCOUNTER NOTE  History:  44 y.o. [id number] here today for today for IUD string check; Mirena  IUD was placed 10/10/2019. No complaints about the IUD, no concerning side effects.  Denies difficulty breathing or respiratory distress, chest pain, abdominal pain, excessive vaginal bleeding, dysuria, and leg pain or swelling.   The following portions of the patient's history were reviewed and updated as appropriate: allergies, current medications, past family history, past medical history, past social history, past surgical history and problem list. Last pap smear on 07/2018 was normal, negative HRHPV.  Review of Systems:   Pertinent items are noted in HPI.  Objective:   BP 135/63   Pulse 63   Ht 5\' 4"  (1.626 m)   Wt 153 lb 5 oz (69.5 kg)   BMI 26.32 kg/m   PHYSICAL EXAM:  CONSTITUTIONAL: Well-developed, well-nourished female in no acute distress.   ABDOMEN: Soft, no distention noted.    PELVIC: Normal appearing external genitalia; normal appearing vaginal mucosa and cervix.  IUD strings visualized, about 3 cm in length outside cervix.   Assessment & Plan:  Patient to keep IUD in place for up to five (5)  years; can come in for removal if any concerning side effects.  Reviewed red flag symptoms and when to call.   RTC for ANNUAL EXAM or sooner if needed.    Fransico Him, McEwen, CNM, [facility], [place]

## 2019-12-05 ENCOUNTER — Encounter: Payer: Managed Care, Other (non HMO) | Admitting: Certified Nurse Midwife

## 2019-12-12 ENCOUNTER — Encounter: Payer: Self-pay | Admitting: Family Medicine

## 2019-12-24 ENCOUNTER — Other Ambulatory Visit: Payer: Self-pay | Admitting: Family Medicine

## 2019-12-24 ENCOUNTER — Telehealth: Payer: Self-pay | Admitting: Certified Nurse Midwife

## 2019-12-24 DIAGNOSIS — Z1231 Encounter for screening mammogram for malignant neoplasm of breast: Secondary | ICD-10-CM

## 2019-12-24 NOTE — Telephone Encounter (Signed)
Called pt and spoke to her. I stated that she called in and canceled her physical, and asked her if she wanted to reschedule. The pt stated she doesn't need this appt that she got it done with her PCP. I told the pt I would take her off the list but if she needed anything to give [place] a call.

## 2020-01-13 ENCOUNTER — Other Ambulatory Visit
Admission: RE | Admit: 2020-01-13 | Discharge: 2020-01-13 | Disposition: A | Discharge status: Home / Self Care | Payer: Managed Care, Other (non HMO) | Source: Ambulatory Visit | Attending: Gastroenterology | Admitting: Gastroenterology

## 2020-01-13 ENCOUNTER — Other Ambulatory Visit: Payer: Self-pay

## 2020-01-13 DIAGNOSIS — Z20822 Contact with and (suspected) exposure to covid-19: Secondary | ICD-10-CM | POA: Insufficient documentation

## 2020-01-13 DIAGNOSIS — Z01812 Encounter for preprocedural laboratory examination: Secondary | ICD-10-CM | POA: Insufficient documentation

## 2020-01-13 LAB — SARS CORONAVIRUS 2 (TAT 6-24 HRS): SARS Coronavirus 2: NEGATIVE

## 2020-01-14 ENCOUNTER — Encounter: Payer: Self-pay | Admitting: Gastroenterology

## 2020-01-15 ENCOUNTER — Other Ambulatory Visit: Payer: Self-pay

## 2020-01-15 ENCOUNTER — Ambulatory Visit
Admission: RE | Admit: 2020-01-15 | Discharge: 2020-01-15 | Disposition: A | Discharge status: Home / Self Care | Payer: Managed Care, Other (non HMO) | Attending: Gastroenterology | Admitting: Gastroenterology

## 2020-01-15 ENCOUNTER — Ambulatory Visit: Payer: Managed Care, Other (non HMO) | Admitting: Certified Registered"

## 2020-01-15 ENCOUNTER — Encounter
Admission: RE | Disposition: A | Discharge status: Home / Self Care | Payer: Self-pay | Source: Home / Self Care | Attending: Gastroenterology

## 2020-01-15 ENCOUNTER — Encounter: Payer: Self-pay | Admitting: Gastroenterology

## 2020-01-15 DIAGNOSIS — K635 Polyp of colon: Secondary | ICD-10-CM

## 2020-01-15 DIAGNOSIS — D122 Benign neoplasm of ascending colon: Secondary | ICD-10-CM | POA: Diagnosis not present

## 2020-01-15 DIAGNOSIS — Z1211 Encounter for screening for malignant neoplasm of colon: Secondary | ICD-10-CM | POA: Diagnosis present

## 2020-01-15 DIAGNOSIS — Z800 Family history of malignant neoplasm of digestive organs: Secondary | ICD-10-CM

## 2020-01-15 DIAGNOSIS — D120 Benign neoplasm of cecum: Secondary | ICD-10-CM | POA: Insufficient documentation

## 2020-01-15 DIAGNOSIS — D124 Benign neoplasm of descending colon: Secondary | ICD-10-CM | POA: Insufficient documentation

## 2020-01-15 DIAGNOSIS — Z79899 Other long term (current) drug therapy: Secondary | ICD-10-CM | POA: Insufficient documentation

## 2020-01-15 HISTORY — PX: COLONOSCOPY WITH PROPOFOL: SHX5780

## 2020-01-15 LAB — POCT PREGNANCY, URINE: Preg Test, Ur: NEGATIVE

## 2020-01-15 SURGERY — COLONOSCOPY WITH PROPOFOL
Anesthesia: General

## 2020-01-15 MED ORDER — PROPOFOL 500 MG/50ML IV EMUL
INTRAVENOUS | Status: DC | PRN
  Administered 2020-01-15: 100 ug/kg/min via INTRAVENOUS

## 2020-01-15 MED ORDER — PROPOFOL 500 MG/50ML IV EMUL
INTRAVENOUS | Status: AC
  Filled 2020-01-15: qty 50

## 2020-01-15 MED ORDER — MIDAZOLAM HCL 2 MG/2ML IJ SOLN
INTRAMUSCULAR | Status: DC | PRN
  Administered 2020-01-15: 2 mg via INTRAVENOUS

## 2020-01-15 MED ORDER — SODIUM CHLORIDE 0.9 % IV SOLN
INTRAVENOUS | Status: DC | PRN
Start: 2020-01-15 — End: 2020-01-15

## 2020-01-15 MED ORDER — LIDOCAINE HCL (PF) 1 % IJ SOLN
INTRAMUSCULAR | Status: AC
  Administered 2020-01-15: 0.05 mL
  Filled 2020-01-15: qty 2

## 2020-01-15 MED ORDER — MIDAZOLAM HCL 2 MG/2ML IJ SOLN
INTRAMUSCULAR | Status: AC
  Filled 2020-01-15: qty 2

## 2020-01-15 MED ORDER — PROPOFOL 10 MG/ML IV BOLUS
INTRAVENOUS | Status: DC | PRN
Start: 2020-01-15 — End: 2020-01-15
  Administered 2020-01-15: 50 mg via INTRAVENOUS
  Administered 2020-01-15: 20 mg via INTRAVENOUS
  Administered 2020-01-15: 70 mg via INTRAVENOUS

## 2020-01-15 MED ORDER — LIDOCAINE HCL (CARDIAC) PF 100 MG/5ML IV SOSY
PREFILLED_SYRINGE | INTRAVENOUS | Status: DC | PRN
  Administered 2020-01-15: 50 mg via INTRAVENOUS

## 2020-01-15 MED ORDER — SODIUM CHLORIDE 0.9 % IV SOLN: INTRAVENOUS | Status: DC

## 2020-01-15 MED ORDER — PHENYLEPHRINE HCL (PRESSORS) 10 MG/ML IV SOLN
INTRAVENOUS | Status: AC
  Filled 2020-01-15: qty 1

## 2020-01-15 NOTE — Anesthesia Procedure Notes (Signed)
Date/Time: 01/15/2020 8:20 AM Performed by: Jerrye Noble, CRNA Pre-anesthesia Checklist: Patient identified, Emergency Drugs available, Suction available and Patient being monitored Oxygen Delivery Method: Nasal cannula

## 2020-01-15 NOTE — Anesthesia Preprocedure Evaluation (Addendum)
Anesthesia Evaluation  Patient identified by MRN, date of birth, ID band Patient awake    Reviewed: Allergy & Precautions, H&P , NPO status , Patient's Chart, lab work & pertinent test results, reviewed documented beta blocker date and time   History of Anesthesia Complications Negative for: history of anesthetic complications  Airway Mallampati: I  TM Distance: >3 FB Neck ROM: full    Dental  (+) Dental Advidsory Given, Caps, Teeth Intact   Pulmonary neg pulmonary ROS,    Pulmonary exam normal breath sounds clear to auscultation       Cardiovascular Exercise Tolerance: Good negative cardio ROS Normal cardiovascular exam Rhythm:regular Rate:Normal     Neuro/Psych negative neurological ROS  negative psych ROS   GI/Hepatic negative GI ROS, Neg liver ROS,   Endo/Other  negative endocrine ROS  Renal/GU negative Renal ROS  negative genitourinary   Musculoskeletal   Abdominal   Peds  Hematology negative hematology ROS (+)   Anesthesia Other Findings Past Medical History: No date: Breast lump No date: Iron deficiency anemia No date: Vaginal Pap smear, abnormal   Reproductive/Obstetrics negative OB ROS                            Anesthesia Physical Anesthesia Plan  ASA: I  Anesthesia Plan: General   Post-op Pain Management:    Induction: Intravenous  PONV Risk Score and Plan: 3 and Propofol infusion and TIVA  Airway Management Planned: Natural Airway and Nasal Cannula  Additional Equipment:   Intra-op Plan:   Post-operative Plan:   Informed Consent: I have reviewed the patients History and Physical, chart, labs and discussed the procedure including the risks, benefits and alternatives for the proposed anesthesia with the patient or authorized representative who has indicated his/her understanding and acceptance.     Dental Advisory Given  Plan Discussed with:  Anesthesiologist, CRNA and Surgeon  Anesthesia Plan Comments:         Anesthesia Quick Evaluation

## 2020-01-15 NOTE — H&P (Signed)
Vonda Antigua, MD [address], [place], [place], [place], [address] [address], [place], [place], [place], [address] Phone: [phone]  Fax: [phone]  Primary Care Physician:  Steele Sizer, MD   Pre-Procedure History & Physical: HPI:  Nicole Roman is a 44 y.o. female is here for a colonoscopy.   Past Medical History:  Diagnosis Date  . Breast lump   . Iron deficiency anemia   . Vaginal Pap smear, abnormal     Past Surgical History:  Procedure Laterality Date  . 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 Aretha Parrot with [facility] in [place]  . LEEP  2002  . tummy tuck  07/30/2017   Dr. Aretha Parrot with [facility] in [place]    Prior to Admission medications   Medication Sig Start Date End Date Taking? Authorizing Provider  ferrous sulfate 325 (65 FE) MG EC tablet Take 1 tablet (325 mg total) by mouth 3 (three) times daily with meals. Patient not taking: Reported on 09/11/2019 06/30/19   Steele Sizer, MD  levocetirizine (XYZAL) 5 MG tablet Take 1 tablet (5 mg total) by mouth every evening. Patient not taking: Reported on 09/11/2019 06/17/19   Steele Sizer, MD  levonorgestrel (MIRENA) 20 MCG/24HR IUD 1 each by Intrauterine route once.    Shambley, Melody N, CNM  loratadine (CLARITIN) 10 MG tablet Take 10 mg by mouth daily.    [provider]    Allergies as of 09/12/2019  . (No Known Allergies)    Family History  Problem Relation Age of Onset  . Cancer Mother        colon  . Hypertension Mother   . Diabetes Father   . Breast cancer Neg Hx     Social History   Socioeconomic History  . Marital status: Divorced    Spouse name: Not on file  . Number of children: 2  . Years of education: Not on file  . Highest education level: Some college, no degree  Occupational History  . Not on file  Tobacco Use  . Smoking status: Never Smoker  . Smokeless tobacco: Never Used  Vaping Use  . Vaping Use: Never used  Substance and Sexual  Activity  . Alcohol use: Yes    Comment: occasionally  . Drug use: No  . Sexual activity: Yes    Partners: Male    Birth control/protection: I.U.D.  Other Topics Concern  . Not on file  Social History Narrative   Patient is originally from [place]   Dating for many years   Social Determinants of Health   Financial Resource Strain: Low Risk   . Difficulty of Paying Living Expenses: Not hard at all  Food Insecurity: No Food Insecurity  . Worried About Charity fundraiser in the Last Year: Never true  . Ran Out of Food in the Last Year: Never true  Transportation Needs: No Transportation Needs  . Lack of Transportation (Medical): No  . Lack of Transportation (Non-Medical): No  Physical Activity: Insufficiently Active  . Days of Exercise per Week: 2 days  . Minutes of Exercise per Session: 60 min  Stress: No Stress Concern Present  . Feeling of Stress : Not at all  Social Connections: Moderately Integrated  . Frequency of Communication with Friends and Family: More than three times a week  . Frequency of Social Gatherings with Friends and Family: More than three times a week  . Attends Religious Services: More than 4 times per year  . Active Member  of Clubs or Organizations: Yes  . Attends Archivist Meetings: More than 4 times per year  . Marital Status: Divorced  Human resources officer Violence: Not At Risk  . Fear of Current or Ex-Partner: No  . Emotionally Abused: No  . Physically Abused: No  . Sexually Abused: No    Review of Systems: See HPI, otherwise negative ROS  Physical Exam: BP 131/62   Pulse 63   Temp (!) 97.3 F (36.3 C) (Temporal)   Resp 16   Ht 5\' 4"  (1.626 m)   Wt 67.6 kg   BMI 25.58 kg/m  General:   Alert,  pleasant and cooperative in NAD Head:  Normocephalic and atraumatic. Neck:  Supple; no masses or thyromegaly. Lungs:  Clear throughout to auscultation, normal respiratory effort.    Heart:  +S1, +S2, Regular rate and rhythm, No  edema. Abdomen:  Soft, nontender and nondistended. Normal bowel sounds, without guarding, and without rebound.   Neurologic:  Alert and  oriented x4;  grossly normal neurologically.  Impression/Plan: Nicole Roman is here for a colonoscopy to be performed for  Family history of colon cancer  Risks, benefits, limitations, and alternatives regarding  colonoscopy have been reviewed with the patient.  Questions have been answered.  All parties agreeable.   Virgel Manifold, MD  01/15/2020, 8:13 AM

## 2020-01-15 NOTE — Transfer of Care (Signed)
Immediate Anesthesia Transfer of Care Note  Patient: Nicole Roman  Procedure(s) Performed: COLONOSCOPY WITH PROPOFOL (N/A )  Patient Location: PACU and Endoscopy Unit  Anesthesia Type:General  Level of Consciousness: drowsy  Airway & Oxygen Therapy: Patient Spontanous Breathing  Post-op Assessment: Report given to RN and Post -op Vital signs reviewed and stable  Post vital signs: Reviewed and stable  Last Vitals:  Vitals Value Taken Time  BP 119/61 01/15/20 0850  Temp    Pulse 89 01/15/20 0850  Resp 20 01/15/20 0850  SpO2 100 % 01/15/20 0850    Last Pain:  Vitals:   01/15/20 0850  TempSrc:   PainSc: Asleep         Complications: No complications documented.

## 2020-01-15 NOTE — Op Note (Signed)
[facility] Gastroenterology Patient Name: Makailyn Mccormick Procedure Date: 01/15/2020 8:15 AM MRN: [id number] Account #: [id number] Date of Birth: 1975/08/06 Admit Type: Outpatient Age: 44 Room: [facility] ENDO ROOM 3 Gender: Female Note Status: Finalized Procedure:             Colonoscopy Indications:           Screening in patient at increased risk: Family history                         of 1st-degree relative with colorectal cancer Providers:             Zori Benbrook B. Bonna Gains MD, MD Referring MD:          Bethena Roys. Sowles, MD (Referring MD) Medicines:             Monitored Anesthesia Care Complications:         No immediate complications. Procedure:             Pre-Anesthesia Assessment:                        - ASA Grade Assessment: II - A patient with mild                         systemic disease.                        - Prior to the procedure, a History and Physical was                         performed, and patient medications, allergies and                         sensitivities were reviewed. The patient's tolerance                         of previous anesthesia was reviewed.                        - The risks and benefits of the procedure and the                         sedation options and risks were discussed with the                         patient. All questions were answered and informed                         consent was obtained.                        - Patient identification and proposed procedure were                         verified prior to the procedure by the physician, the                         nurse, the anesthesiologist, the anesthetist and the  technician. The procedure was verified in the                         procedure room.                        After obtaining informed consent, the colonoscope was                         passed under direct vision. Throughout the procedure,                         the patient's  blood pressure, pulse, and oxygen                         saturations were monitored continuously. The                         Colonoscope was introduced through the anus and                         advanced to the the surgical stoma. The colonoscopy                         was performed with ease. The patient tolerated the                         procedure well. The quality of the bowel preparation                         was good. Findings:      The perianal and digital rectal examinations were normal.      Four sessile polyps were found in the descending colon, ascending colon       and cecum. The polyps were 3 to 4 mm in size. These polyps were removed       with a cold biopsy forceps. Resection and retrieval were complete.      The exam was otherwise without abnormality.      The rectum, sigmoid colon, descending colon, transverse colon, ascending       colon and cecum appeared normal.      The retroflexed view of the distal rectum and anal verge was normal and       showed no anal or rectal abnormalities. Impression:            - Four 3 to 4 mm polyps in the descending colon, in                         the ascending colon and in the cecum, removed with a                         cold biopsy forceps. Resected and retrieved.                        - The examination was otherwise normal.                        - The rectum, sigmoid colon, descending colon,  transverse colon, ascending colon and cecum are normal.                        - The distal rectum and anal verge are normal on                         retroflexion view. Recommendation:        - Discharge patient to home (with escort).                        - Advance diet as tolerated.                        - Continue present medications.                        - Await pathology results.                        - Repeat colonoscopy date to be determined after                         pending pathology  results are reviewed.                        - The findings and recommendations were discussed with                         the patient.                        - The findings and recommendations were discussed with                         the patient's family.                        - Return to primary care physician as previously                         scheduled. Procedure Code(s):     --- Professional ---                        [phone], Colonoscopy, flexible; with biopsy, single or                         multiple Diagnosis Code(s):     --- Professional ---                        Z80.0, Family history of malignant neoplasm of                         digestive organs                        K63.5, Polyp of colon CPT copyright 2019 American Medical Association. All rights reserved. The codes documented in this report are preliminary and upon coder review may  be revised to meet current compliance requirements.  Vonda Antigua, MD Margretta Sidle B. Bonna Gains MD, MD 01/15/2020 8:59:22 AM This report has been signed electronically.  Number of Addenda: 0 Note Initiated On: 01/15/2020 8:15 AM Scope Withdrawal Time: 0 hours 20 minutes 43 seconds  Total Procedure Duration: 0 hours 23 minutes 33 seconds  Estimated Blood Loss:  Estimated blood loss: none.      [facility]

## 2020-01-16 ENCOUNTER — Ambulatory Visit
Admission: RE | Admit: 2020-01-16 | Discharge: 2020-01-16 | Disposition: A | Discharge status: Home / Self Care | Payer: Managed Care, Other (non HMO) | Source: Ambulatory Visit | Attending: Family Medicine | Admitting: Family Medicine

## 2020-01-16 ENCOUNTER — Encounter: Payer: Self-pay | Admitting: Gastroenterology

## 2020-01-16 DIAGNOSIS — Z1231 Encounter for screening mammogram for malignant neoplasm of breast: Secondary | ICD-10-CM | POA: Insufficient documentation

## 2020-01-16 LAB — SURGICAL PATHOLOGY

## 2020-01-17 NOTE — Anesthesia Postprocedure Evaluation (Signed)
Anesthesia Post Note  Patient: Nicole Roman  Procedure(s) Performed: COLONOSCOPY WITH PROPOFOL (N/A )  Patient location during evaluation: Endoscopy Anesthesia Type: General Level of consciousness: awake and alert Pain management: pain level controlled Vital Signs Assessment: post-procedure vital signs reviewed and stable Respiratory status: spontaneous breathing, nonlabored ventilation, respiratory function stable and patient connected to nasal cannula oxygen Cardiovascular status: blood pressure returned to baseline and stable Postop Assessment: no apparent nausea or vomiting Anesthetic complications: no   No complications documented.   Last Vitals:  Vitals:   01/15/20 0900 01/15/20 0910  BP: (!) 111/59 117/69  Pulse: 68 (!) 59  Resp: (!) 28 16  Temp:    SpO2: 100% 100%    Last Pain:  Vitals:   01/15/20 0910  TempSrc:   PainSc: 0-No pain                 Martha Clan

## 2020-01-23 ENCOUNTER — Encounter: Payer: Self-pay | Admitting: Gastroenterology

## 2020-02-26 ENCOUNTER — Encounter: Payer: Self-pay | Admitting: Family Medicine

## 2020-03-05 ENCOUNTER — Ambulatory Visit (LOCAL_COMMUNITY_HEALTH_CENTER): Payer: Self-pay

## 2020-03-05 ENCOUNTER — Other Ambulatory Visit: Payer: Self-pay

## 2020-03-05 DIAGNOSIS — Z23 Encounter for immunization: Secondary | ICD-10-CM

## 2020-03-06 ENCOUNTER — Other Ambulatory Visit: Payer: Self-pay

## 2020-03-06 MED ORDER — FLUCONAZOLE 150 MG PO TABS
150.0000 mg | ORAL_TABLET | Freq: Every day | ORAL | 0 refills | Status: DC
Start: 2020-03-06 — End: 2020-08-24

## 2020-06-15 ENCOUNTER — Ambulatory Visit: Payer: Managed Care, Other (non HMO) | Admitting: Family Medicine

## 2020-08-24 ENCOUNTER — Encounter: Payer: Self-pay | Admitting: Family Medicine

## 2020-08-24 ENCOUNTER — Other Ambulatory Visit (HOSPITAL_COMMUNITY)
Admission: RE | Admit: 2020-08-24 | Discharge: 2020-08-24 | Disposition: A | Discharge status: Home / Self Care | Payer: Managed Care, Other (non HMO) | Source: Ambulatory Visit | Attending: Family Medicine | Admitting: Family Medicine

## 2020-08-24 ENCOUNTER — Other Ambulatory Visit: Payer: Self-pay

## 2020-08-24 ENCOUNTER — Ambulatory Visit: Payer: Managed Care, Other (non HMO) | Admitting: Family Medicine

## 2020-08-24 VITALS — BP 124/82 | HR 84 | Temp 98.5°F | Resp 16 | Ht 62.0 in | Wt 161.4 lb

## 2020-08-24 DIAGNOSIS — N898 Other specified noninflammatory disorders of vagina: Secondary | ICD-10-CM | POA: Insufficient documentation

## 2020-08-24 DIAGNOSIS — L270 Generalized skin eruption due to drugs and medicaments taken internally: Secondary | ICD-10-CM

## 2020-08-24 DIAGNOSIS — T50A95D Adverse effect of other bacterial vaccines, subsequent encounter: Secondary | ICD-10-CM

## 2020-08-24 DIAGNOSIS — T50915A Adverse effect of multiple unspecified drugs, medicaments and biological substances, initial encounter: Secondary | ICD-10-CM | POA: Diagnosis not present

## 2020-08-24 DIAGNOSIS — N761 Subacute and chronic vaginitis: Secondary | ICD-10-CM

## 2020-08-24 MED ORDER — METRONIDAZOLE 0.75 % VA GEL: 1.0000 | Freq: Every day | VAGINAL | 0 refills | Status: AC

## 2020-08-24 NOTE — Progress Notes (Signed)
Patient ID: Nicole Roman, female    DOB: [birth year], 45 y.o.   MRN: [id number]  PCP: Alba CorySowles, Krichna, MD  Chief Complaint  Patient presents with  . Vaginal Discharge    Onset 2 months  has tried OTC insert pills, she states goes away then comes back. White watery in color with smell    Subjective:   Nicole Roman is a 45 y.o. female, presents to clinic with CC of the following:  Vaginal Discharge The patient's primary symptoms include genital itching, a genital odor and vaginal discharge. The patient's pertinent negatives include no genital lesions, genital rash, missed menses, pelvic pain or vaginal bleeding. This is a recurrent problem. The current episode started more than 1 month ago. The problem has been waxing and waning. The patient is experiencing no pain. Pertinent negatives include no abdominal pain, anorexia, back pain, chills, discolored urine, dysuria, fever, flank pain, frequency, headaches, hematuria, nausea, painful intercourse, urgency or vomiting. The vaginal discharge was white and malodorous. She has tried antifungals for the symptoms. The treatment provided mild relief. It is unknown whether or not her partner has an STD. 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 -she has large flat hyperpigmented lesions that have not improved on several parts of her body She reports this reaction to Diflucan which she previously had no problem with she also had a reaction to her tetanus booster -she had a cut at work and was given a booster and that also caused a drug eruption She has seen [place] [facility] has not seen an allergist her immunologist  She reports a history of recurrent yeast vaginitis she did  take 1 dose of Monistat and it improved her itching symptoms which she has had over the last month but been she began to have recurrent discharge that was white now with odor which was similar to when she had BV in the past.  She also had return of some of her genital vaginal itching    Patient Active Problem List   Diagnosis Date Noted  . Family history of colon cancer in mother   . Polyp of colon   . IUD (intrauterine device) in place 11/08/2019  . B12 deficiency 08/14/2018  . Ovarian cyst, right 02/09/2016  . Goiter 02/08/2016      Current Outpatient Medications:  .  metroNIDAZOLE (METROGEL VAGINAL) 0.75 % vaginal gel, Place 1 Applicatorful vaginally at bedtime for 7 days., Disp: 70 g, Rfl: 0 .  ferrous sulfate 325 (65 FE) MG EC tablet, Take 1 tablet (325 mg total) by mouth 3 (three) times daily with meals. (Patient not taking: No sig reported), Disp: 90 tablet, Rfl: 1 .  fluconazole (DIFLUCAN) 150 MG tablet, Take 1 tablet (150 mg total) by mouth daily. (Patient not taking: Reported on 08/24/2020), Disp: 2 tablet, Rfl: 0 .  levocetirizine (XYZAL) 5 MG tablet, Take 1 tablet (5 mg total) by mouth every evening. (Patient not taking: No sig reported), Disp: 90 tablet, Rfl: 0 .  levonorgestrel (MIRENA) 20 MCG/24HR IUD, 1 each by Intrauterine route once. (Patient not taking: Reported on 08/24/2020), Disp: , Rfl:  .  loratadine (CLARITIN) 10 MG tablet, Take 10  mg by mouth daily. (Patient not taking: No sig reported), Disp: , Rfl:    No Known Allergies   Social History   Tobacco Use  . Smoking status: Never Smoker  . Smokeless tobacco: Never Used  Vaping Use  . Vaping Use: Never used  Substance Use Topics  . Alcohol use: Yes    Comment: occasionally  . Drug use: No      Chart Review Today: I personally reviewed active problem list, medication list, allergies, family history, social history, health maintenance, notes from last encounter, lab results, imaging with the  patient/caregiver today.   Review of Systems  Constitutional: Negative.  Negative for chills and fever.  HENT: Negative.   Eyes: Negative.   Respiratory: Negative.   Cardiovascular: Negative.   Gastrointestinal: Negative.  Negative for abdominal pain, anorexia, nausea and vomiting.  Endocrine: Negative.   Genitourinary: Positive for vaginal discharge. Negative for dysuria, flank pain, frequency, hematuria, missed menses, pelvic pain and urgency.  Musculoskeletal: Negative.  Negative for back pain.  Skin: Negative.   Allergic/Immunologic: Negative.   Neurological: Negative.  Negative for headaches.  Hematological: Negative.   Psychiatric/Behavioral: Negative.   All other systems reviewed and are negative.      Objective:   Vitals:   08/24/20 0934  BP: 124/82  Pulse: 84  Resp: 16  Temp: 98.5 F (36.9 C)  SpO2: 98%  Weight: 161 lb 6.4 oz (73.2 kg)  Height: 5\' 2"  (1.575 m)    Body mass index is 29.52 kg/m.  Physical Exam Vitals and nursing note reviewed.  Constitutional:      General: She is not in acute distress.    Appearance: Normal appearance. She is well-developed. She is not ill-appearing, toxic-appearing or diaphoretic.  HENT:     Head: Normocephalic and atraumatic.     Nose: Nose normal.  Eyes:     General:        Right eye: No discharge.        Left eye: No discharge.     Conjunctiva/sclera: Conjunctivae normal.  Neck:     Trachea: No tracheal deviation.  Cardiovascular:     Rate and Rhythm: Normal rate and regular rhythm.  Pulmonary:     Effort: Pulmonary effort is normal. No respiratory distress.     Breath sounds: No stridor.  Abdominal:     General: Bowel sounds are normal. There is no distension.     Palpations: Abdomen is soft.     Tenderness: There is no abdominal tenderness. There is no guarding or rebound.  Musculoskeletal:        General: Normal range of motion.  Skin:    General: Skin is warm and dry.     Comments: Not able to eval  much of skin - she showed me right lower leg and foot - large 10 cm diameter area of hyperpigmentation  - flat  Neurological:     Mental Status: She is alert.     Motor: No abnormal muscle tone.     Coordination: Coordination normal.  Psychiatric:        Mood and Affect: Mood and affect and mood normal.        Behavior: Behavior normal.      Results for orders placed or performed during the hospital encounter of 01/15/20  Pregnancy, urine POC  Result Value Ref Range   Preg Test, Ur NEGATIVE NEGATIVE  Surgical pathology  Result Value Ref Range   SURGICAL PATHOLOGY      SURGICAL  PATHOLOGY CASE: [phone] PATIENT: [facility] Surgical Pathology Report     Specimen Submitted: A. Colon polyp x4; cbx  Clinical History: Mother colon cancer, screening colon cancer.  Polyps      DIAGNOSIS: A.  COLON POLYP X4; COLD BIOPSY: - TUBULAR ADENOMA (MULTIPLE FRAGMENTS). - NEGATIVE FOR HIGH-GRADE DYSPLASIA AND MALIGNANCY.  GROSS DESCRIPTION: A. Labeled: CBX colon polyp x4 Received: Formalin Tissue fragment(s): Multiple Size: Aggregate, 1.2 x 0.7 x 0.1 cm Description: Tan soft tissue fragments Entirely submitted in 1 cassette.    Final Diagnosis performed by Quay Burow, MD.   Electronically signed 01/16/2020 11:22:57AM The electronic signature indicates that the named Attending Pathologist has evaluated the specimen Technical component performed at [facility], [address] [place], [place] [address] Lab: [phone] Dir: Rush Farmer, MD, MMM  Professional component performed at [facility], [place], [place], [place] [address] Lab: [phone] Dir: Dellia Nims. Reuel Derby, MD        Assessment & Plan:     ICD-10-CM   1. Subacute vaginitis  N76.1 Cervicovaginal ancillary only    metroNIDAZOLE (METROGEL VAGINAL) 0.75 % vaginal gel   recurrent yeast vaginitis and recently BV sx  2. 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 Adverse effect of tetanus vaccine, subsequent encounter  T50.A95D Ambulatory referral to Allergy    Patient with symptoms of both yeast vaginitis and possibly BV -she has recurrence of yeast vaginitis, she did an over-the-counter treatment which was somewhat helpful but symptoms have returned.  This is complicated by a recent new drug reaction to Diflucan which she used to frequently use. Additionally she is going out of country for vacation in 2 days  Vaginal swab was done but may not be resulted by the time she leaves for vacation  With recent odor associated with her vaginal discharge we will treat with MetroGel -  I added allergies to her chart of but she told me she is recently had reactions to including a tetanus booster and Diflucan Do think she should avoid Diflucan because she has had drug reaction with prolonged skin changes and abnormalities that are fairly severe and large -encouraged her to do the BV treatment first and if she has any yeast symptoms recur to try and use the over-the-counter Monistat treatments.  We will contact her with her lab results  With her recent allergies to multiple drugs and vaccinations did put in a referral to allergy and immunology for consult -  She did go to [facility] dermatology and had a biopsy but I cannot see those records she states that she was diagnosed with multiple drug eruptions?   She did want to see a specialist with her new changes she does not have any prior drug allergies or food allergies. No prior difficulty with any vaccinations and she would like to know what ingredients or drugs her vaccinations in the future she needs to avoid.  Chart reviewed - more than 10 min spent on prior review, updating allergies. Spent more than 15 min with pt today More than 10 min to  complete documentation, orders, plan, referrals etc.   Delsa Grana, PA-C 08/24/20 10:03 AM

## 2020-08-25 LAB — CERVICOVAGINAL ANCILLARY ONLY
Bacterial Vaginitis (gardnerella): POSITIVE — AB
Candida Glabrata: NEGATIVE
Candida Vaginitis: NEGATIVE
Chlamydia: NEGATIVE
Comment: NEGATIVE
Comment: NEGATIVE
Comment: NEGATIVE
Comment: NEGATIVE
Comment: NEGATIVE
Comment: NORMAL
Neisseria Gonorrhea: NEGATIVE
Trichomonas: NEGATIVE

## 2020-10-27 ENCOUNTER — Other Ambulatory Visit: Payer: Self-pay

## 2020-10-27 ENCOUNTER — Ambulatory Visit: Payer: Managed Care, Other (non HMO) | Admitting: Physician Assistant

## 2020-10-27 ENCOUNTER — Other Ambulatory Visit (HOSPITAL_COMMUNITY)
Admission: RE | Admit: 2020-10-27 | Discharge: 2020-10-27 | Disposition: A | Discharge status: Home / Self Care | Payer: Managed Care, Other (non HMO) | Source: Ambulatory Visit | Attending: Physician Assistant | Admitting: Physician Assistant

## 2020-10-27 ENCOUNTER — Encounter: Payer: Self-pay | Admitting: Physician Assistant

## 2020-10-27 VITALS — BP 110/70 | HR 66 | Temp 98.2°F | Resp 16 | Ht 62.0 in | Wt 157.0 lb

## 2020-10-27 DIAGNOSIS — N898 Other specified noninflammatory disorders of vagina: Secondary | ICD-10-CM

## 2020-10-27 DIAGNOSIS — L270 Generalized skin eruption due to drugs and medicaments taken internally: Secondary | ICD-10-CM

## 2020-10-27 NOTE — Patient Instructions (Signed)
Bacterial Vaginosis  Bacterial vaginosis is an infection of the vagina. It happens when too many normal germs (healthy bacteria) grow in the vagina. This infection can make it easier to get other infections from sex (STIs). It is very important for pregnant women to get treated. This infection can cause babies to be born early or at a low birth weight. What are the causes? This infection is caused by an increase in certain germs that grow in the vagina. You cannot get this infection from toilet seats, bedsheets, swimming pools, or things that touch your vagina. What increases the risk?  Having sex with a new person or more than one person.  Having sex without protection.  Douching.  Having an intrauterine device (IUD).  Smoking.  Using drugs or drinking alcohol. These can lead you to do things that are risky.  Taking certain antibiotic medicines.  Being pregnant. What are the signs or symptoms? Some women have no symptoms. Symptoms may include:  A discharge from your vagina. It may be gray or white. It can be watery or foamy.  A fishy smell. This can happen after sex or during your menstrual period.  Itching in and around your vagina.  A feeling of burning or pain when you pee (urinate). How is this treated? This infection is treated with antibiotic medicines. These may be given to you as:  A pill.  A cream for your vagina.  A medicine that you put into your vagina (suppository). If the infection comes back after treatment, you may need more antibiotics. Follow these instructions at home: Medicines  Take over-the-counter and prescription medicines as told by your doctor.  Take or use your antibiotic medicine as told by your doctor. Do not stop taking or using it, even if you start to feel better. General instructions  If the person you have sex with is a woman, tell her that you have this infection. She will need to follow up with her doctor. If you have a female  partner, he does not need to be treated.  Do not have sex until you finish treatment.  Drink enough fluid to keep your pee pale yellow.  Keep your vagina and butt clean. ? Wash the area with warm water each day. ? Wipe from front to back after you use the toilet.  If you are breastfeeding a baby, ask your doctor if you should keep doing so during treatment.  Keep all follow-up visits. How is this prevented? Self-care  Do not douche.  Use only warm water to wash around your vagina.  Wear underwear that is cotton or lined with cotton.  Do not wear tight pants and pantyhose, especially in the summer. Safe sex  Use protection when you have sex. This includes: ? Use condoms. ? Use dental dams. This is a thin layer that protects the mouth during oral sex.  Limit how many people you have sex with. To prevent this infection, it is best to have sex with just one person.  Get tested for STIs. The person you have sex with should also get tested. Drugs and alcohol  Do not smoke or use any products that contain nicotine or tobacco. If you need help quitting, ask your doctor.  Do not use drugs.  Do not drink alcohol if: ? Your doctor tells you not to drink. ? You are pregnant, may be pregnant, or are planning to become pregnant.  If you drink alcohol: ? Limit how much you have to 0-1 drink  a day. ? Know how much alcohol is in your drink. In the [place] one drink equals one 12 oz bottle of beer (355 mL), one 5 oz glass of wine (148 mL), or one 1 oz glass of hard liquor (44 mL). Where to find more information  Centers for Disease Control and Prevention: http://www.wolf.info/  American Sexual Health Association: www.ashastd.org  Office on [employer]: [place] Contact a doctor if:  Your symptoms do not get better, even after you are treated.  You have more discharge or pain when you pee.  You have a fever or chills.  You have pain in your belly (abdomen) or in the area  between your hips.  You have pain with sex.  You bleed from your vagina between menstrual periods. Summary  This infection can happen when too many germs (bacteria) grow in the vagina.  This infection can make it easier to get infections from sex (STIs). Treating this can lower that chance.  Get treated if you are pregnant. This infection can cause babies to be born early.  Do not stop taking or using your antibiotic medicine, even if you start to feel better. This information is not intended to replace advice given to you by your health care provider. Make sure you discuss any questions you have with your health care provider. Document Revised: 01/02/2020 Document Reviewed: 01/02/2020 Elsevier Patient Education  [place].

## 2020-10-27 NOTE — Progress Notes (Signed)
      Established patient visit   Patient: Nicole Roman   DOB: July 16, 1976   45 y.o. Female  MRN: [id number] Visit Date: 10/27/2020  Today's healthcare provider: Trinna Post, PA-C   Chief Complaint  Patient presents with  . Vaginitis    Recurrent BV, yeast   Subjective    HPI HPI    Vaginitis    Comments: Recurrent BV, yeast       Last edited by Hollie Salk, RMA on 10/27/2020  2:27 PM. (History)      She reports thick white clumpy discharge for 2.5 weeks. She has a history of BV as well. She did have some itching which has since resolved. Denies concern for STI.       Medications: Outpatient Medications Prior to Visit  Medication Sig  . levonorgestrel (MIRENA) 20 MCG/24HR IUD 1 each by Intrauterine route once.  . loratadine (CLARITIN) 10 MG tablet Take 10 mg by mouth daily.  . ferrous sulfate 325 (65 FE) MG EC tablet Take 1 tablet (325 mg total) by mouth 3 (three) times daily with meals. (Patient not taking: Reported on 10/27/2020)  . levocetirizine (XYZAL) 5 MG tablet Take 1 tablet (5 mg total) by mouth every evening. (Patient not taking: Reported on 10/27/2020)   No facility-administered medications prior to visit.    Review of Systems  All other systems reviewed and are negative.      Objective    BP 110/70   Pulse 66   Temp 98.2 F (36.8 C) (Oral)   Resp 16   Ht 5\' 2"  (1.575 m)   Wt 157 lb (71.2 kg)   SpO2 99%   BMI 28.72 kg/m     Physical Exam Constitutional:      Appearance: Normal appearance.  Cardiovascular:     Rate and Rhythm: Normal rate and regular rhythm.     Heart sounds: Normal heart sounds.  Pulmonary:     Effort: Pulmonary effort is normal.     Breath sounds: Normal breath sounds.  Abdominal:     General: Bowel sounds are normal.     Palpations: Abdomen is soft.  Skin:    General: Skin is warm and dry.  Neurological:     Mental Status: She is alert and oriented to person, place, and time. Mental status is at baseline.   Psychiatric:        Mood and Affect: Mood normal.        Behavior: Behavior normal.       No results found for any visits on 10/27/20.  Assessment & Plan     1. Vaginal discharge  Will test as below and treat accordingly.   - Cervicovaginal ancillary only  2. Drug rash  - Ambulatory referral to Allergy   Return if symptoms worsen or fail to improve.         Trinna Post, PA-C  [facility] [phone] (phone) [phone] (fax)  [place]

## 2020-10-29 ENCOUNTER — Telehealth: Payer: Self-pay | Admitting: Family Medicine

## 2020-10-29 ENCOUNTER — Other Ambulatory Visit: Payer: Self-pay | Admitting: Family Medicine

## 2020-10-29 DIAGNOSIS — B373 Candidiasis of vulva and vagina: Secondary | ICD-10-CM

## 2020-10-29 DIAGNOSIS — B9689 Other specified bacterial agents as the cause of diseases classified elsewhere: Secondary | ICD-10-CM

## 2020-10-29 DIAGNOSIS — B3731 Acute candidiasis of vulva and vagina: Secondary | ICD-10-CM

## 2020-10-29 LAB — CERVICOVAGINAL ANCILLARY ONLY
Bacterial Vaginitis (gardnerella): POSITIVE — AB
Candida Glabrata: NEGATIVE
Candida Vaginitis: POSITIVE — AB
Chlamydia: NEGATIVE
Comment: NEGATIVE
Comment: NEGATIVE
Comment: NEGATIVE
Comment: NEGATIVE
Comment: NEGATIVE
Comment: NORMAL
Neisseria Gonorrhea: NEGATIVE
Trichomonas: NEGATIVE

## 2020-10-29 MED ORDER — METRONIDAZOLE 500 MG PO TABS
500.0000 mg | ORAL_TABLET | Freq: Two times a day (BID) | ORAL | 0 refills | Status: DC
Start: 2020-10-29 — End: 2020-10-30

## 2020-10-29 NOTE — Telephone Encounter (Signed)
Pt states she does not want to take the oral flagyl. Pt states she is so allergic to meds, she prefers to have a medicine that you can insert.    [id number] - [place], [place]

## 2020-10-30 ENCOUNTER — Other Ambulatory Visit: Payer: Self-pay | Admitting: Family Medicine

## 2020-10-30 DIAGNOSIS — B9689 Other specified bacterial agents as the cause of diseases classified elsewhere: Secondary | ICD-10-CM

## 2020-10-30 MED ORDER — METRONIDAZOLE 0.75 % VA GEL: 1.0000 | Freq: Two times a day (BID) | VAGINAL | 0 refills | Status: DC

## 2020-11-26 NOTE — Patient Instructions (Addendum)
Note   Referral has been sent to [facility]  P: [phone] F: [phone]   Release ID # [id number]       Preventive Care 31-46 Years Old, Female Preventive care refers to lifestyle choices and visits with your health care provider that can promote health and wellness. This includes:  A yearly physical exam. This is also called an annual wellness visit.  Regular dental and eye exams.  Immunizations.  Screening for certain conditions.  Healthy lifestyle choices, such as: ? Eating a healthy diet. ? Getting regular exercise. ? Not using drugs or products that contain nicotine and tobacco. ? Limiting alcohol use. What can I expect for my preventive care visit? Physical exam Your health care provider may check your:  Height and weight. These may be used to calculate your BMI (body mass index). BMI is a measurement that tells if you are at a healthy weight.  Heart rate and blood pressure.  Body temperature.  Skin for abnormal spots. Counseling Your health care provider may ask you questions about your:  Past medical problems.  Family's medical history.  Alcohol, tobacco, and drug use.  Emotional well-being.  Home life and relationship well-being.  Sexual activity.  Diet, exercise, and sleep habits.  Work and work Statistician.  Access to firearms.  Method of birth control.  Menstrual cycle.  Pregnancy history. What immunizations do I need? Vaccines are usually given at various ages, according to a schedule. Your health care provider will recommend vaccines for you based on your age, medical history, and lifestyle or other factors, such as travel or where you work.   What tests do I need? Blood tests  Lipid and cholesterol levels. These may be checked every 5 years starting at age 21.  Hepatitis C test.  Hepatitis B test. Screening  Diabetes screening. This is done by checking your blood sugar (glucose) after you have not eaten for  a while (fasting).  STD (sexually transmitted disease) testing, if you are at risk.  BRCA-related cancer screening. This may be done if you have a family history of breast, ovarian, tubal, or peritoneal cancers.  Pelvic exam and Pap test. This may be done every 3 years starting at age 25. Starting at age 70, this may be done every 5 years if you have a Pap test in combination with an HPV test. Talk with your health care provider about your test results, treatment options, and if necessary, the need for more tests.   Follow these instructions at home: Eating and drinking  Eat a healthy diet that includes fresh fruits and vegetables, whole grains, lean protein, and low-fat dairy products.  Take vitamin and mineral supplements as recommended by your health care provider.  Do not drink alcohol if: ? Your health care provider tells you not to drink. ? You are pregnant, may be pregnant, or are planning to become pregnant.  If you drink alcohol: ? Limit how much you have to 0-1 drink a day. ? Be aware of how much alcohol is in your drink. In the [place] one drink equals one 12 oz bottle of beer (355 mL), one 5 oz glass of wine (148 mL), or one 1 oz glass of hard liquor (44 mL).   Lifestyle  Take daily care of your teeth and gums. Brush your teeth every morning and night with fluoride toothpaste. Floss one time each day.  Stay active. Exercise for at least 30 minutes 5 or more days each week.  Do  not use any products that contain nicotine or tobacco, such as cigarettes, e-cigarettes, and chewing tobacco. If you need help quitting, ask your health care provider.  Do not use drugs.  If you are sexually active, practice safe sex. Use a condom or other form of protection to prevent STIs (sexually transmitted infections).  If you do not wish to become pregnant, use a form of birth control. If you plan to become pregnant, see your health care provider for a prepregnancy visit.  Find healthy ways  to cope with stress, such as: ? Meditation, yoga, or listening to music. ? Journaling. ? Talking to a trusted person. ? Spending time with friends and family. Safety  Always wear your seat belt while driving or riding in a vehicle.  Do not drive: ? If you have been drinking alcohol. Do not ride with someone who has been drinking. ? When you are tired or distracted. ? While texting.  Wear a helmet and other protective equipment during sports activities.  If you have firearms in your house, make sure you follow all gun safety procedures.  Seek help if you have been physically or sexually abused. What's next?  Go to your health care provider once a year for an annual wellness visit.  Ask your health care provider how often you should have your eyes and teeth checked.  Stay up to date on all vaccines. This information is not intended to replace advice given to you by your health care provider. Make sure you discuss any questions you have with your health care provider. Document Revised: 03/01/2020 Document Reviewed: 03/15/2018 Elsevier Patient Education  2021 [employer].

## 2020-11-26 NOTE — Progress Notes (Addendum)
Name: Nicole Roman   MRN: [id number]    DOB: 1975-11-12   Date:11/27/2020       Progress Note  Subjective  Chief Complaint  Annual Exam  HPI  Patient presents for annual CPE.  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   [employer] Visit from 11/27/2020 in [facility]  AUDIT-C Score 0     Depression: Phq 9 is  Grieving, father died recently in [place]  Depression screen [facility] 2/9 11/27/2020 10/27/2020 08/24/2020 09/11/2019 06/17/2019  Decreased Interest 2 0 0 0 0  Down, Depressed, Hopeless 0 0 0 0 0  PHQ - 2 Score 2 0 0 0 0  Altered sleeping 0 - 0 0 0  Tired, decreased energy 0 - 0 0 0  Change in appetite 0 - 0 0 0  Feeling bad or failure about yourself  0 - 0 0 0  Trouble concentrating 0 - 0 0 0  Moving slowly or fidgety/restless 0 - 0 0 0  Suicidal thoughts 0 - 0 0 0  PHQ-9 Score 2 - 0 0 0  Difficult doing work/chores Not difficult at all - Not difficult at all - -  Some recent data might be hidden   Hypertension: BP Readings from Last 3 Encounters:  11/27/20 122/70  10/27/20 110/70  08/24/20 124/82   Obesity: Wt Readings from Last 3 Encounters:  11/27/20 158 lb 1.6 oz (71.7 kg)  10/27/20 157 lb (71.2 kg)  08/24/20 161 lb 6.4 oz (73.2 kg)   BMI Readings from Last 3 Encounters:  11/27/20 28.92 kg/m  10/27/20 28.72 kg/m  08/24/20 29.52 kg/m     Vaccines:   Pneumonia: educated and discussed with patient. Flu: educated and discussed with patient. COVID-19: discussed importance of booster   Hep C Screening: 09/11/19 STD testing and prevention (HIV/chl/gon/syphilis): 08/31/16 Intimate partner violence: negative Sexual History : same sexual partner for the past 5 years, having recurrent BV, he recently was treated and is doing well so far  Menstrual History/LMP/Abnormal Bleeding: has IUD, spotting or light bleeding, had one heavy period recently - advised to contact  OBGYN Incontinence Symptoms: no problems   Breast cancer:  - Last Mammogram: ordering today  - BRCA gene screening: N/A  Osteoporosis: Discussed high calcium and vitamin D supplementation, weight bearing exercises  Cervical cancer screening: 08/14/18  Skin cancer: Discussed monitoring for atypical lesions  Colorectal cancer: 01/15/20  Repeat in 10 years  Lung cancer:  Low Dose CT Chest recommended if Age 26-80 years, 20 pack-year currently smoking OR have quit w/in 15years. Patient does not qualify.   ECG: 07/03/17   Advanced Care Planning: A voluntary discussion about advance care planning including the explanation and discussion of advance directives.  Discussed health care proxy and Living will, and the patient was able to identify a health care proxy as sister   Lipids: Lab Results  Component Value Date   CHOL 160 09/11/2019   CHOL 142 08/11/2018   CHOL 158 10/04/2017   Lab Results  Component Value Date   HDL 52 09/11/2019   HDL 52 08/11/2018   HDL 47 10/04/2017   Lab Results  Component Value Date   LDLCALC 98 09/11/2019   LDLCALC 83 08/11/2018   LDLCALC 105 (H) 10/04/2017   Lab Results  Component Value Date   TRIG 35 09/11/2019   TRIG 36 (A) 08/11/2018   TRIG 30  10/04/2017   Lab Results  Component Value Date   CHOLHDL 3.1 09/11/2019   CHOLHDL 3.4 10/04/2017   CHOLHDL 3.3 08/31/2016   No results found for: LDLDIRECT  Glucose: Glucose  Date Value Ref Range Status  06/30/2017 82 65 - 99 mg/dL Final  08/31/2016 94 65 - 99 mg/dL Final  05/20/2015 85 65 - 99 mg/dL Final   Glucose, Bld  Date Value Ref Range Status  06/19/2019 84 65 - 99 mg/dL Final    Comment:    .            Fasting reference interval .     Patient Active Problem List   Diagnosis Date Noted  . Family history of colon cancer in mother   . Polyp of colon   . IUD (intrauterine device) in place 11/08/2019  . B12 deficiency 08/14/2018  . Ovarian cyst, right 02/09/2016  . Goiter  02/08/2016    Past Surgical History:  Procedure Laterality Date  . AUGMENTATION MAMMAPLASTY Bilateral 2019  . 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 Aretha Parrot with [facility] in [place]  . COLONOSCOPY WITH PROPOFOL N/A 01/15/2020   Procedure: COLONOSCOPY WITH PROPOFOL;  Surgeon: Virgel Manifold, MD;  Location: [facility] ENDOSCOPY;  Service: Gastroenterology;  Laterality: N/A;  . LEEP  2002  . tummy tuck  07/30/2017   Dr. Aretha Parrot with [facility] in [facility]    Family History  Problem Relation Age of Onset  . Cancer Mother        colon  . Hypertension Mother   . Diabetes Father   . Breast cancer Neg Hx     Social History   Socioeconomic History  . Marital status: Divorced    Spouse name: Not on file  . Number of children: 2  . Years of education: Not on file  . Highest education level: Some college, no degree  Occupational History  . Not on file  Tobacco Use  . Smoking status: Never Smoker  . Smokeless tobacco: Never Used  Vaping Use  . Vaping Use: Never used  Substance and Sexual Activity  . Alcohol use: Yes    Comment: occasionally  . Drug use: No  . Sexual activity: Yes    Partners: Male    Birth control/protection: I.U.D.  Other Topics Concern  . Not on file  Social History Narrative   Patient is originally from [place]   Dating for many years   Social Determinants of Health   Financial Resource Strain: Medium Risk  . Difficulty of Paying Living Expenses: Somewhat hard  Food Insecurity: No Food Insecurity  . Worried About Charity fundraiser in the Last Year: Never true  . Ran Out of Food in the Last Year: Never true  Transportation Needs: No Transportation Needs  . Lack of Transportation (Medical): No  . Lack of Transportation (Non-Medical): No  Physical Activity: Sufficiently Active  . Days of Exercise per Week: 3 days  . Minutes of Exercise per Session: 60 min  Stress: No Stress Concern Present  . Feeling of Stress : Only a little   Social Connections: Moderately Isolated  . Frequency of Communication with Friends and Family: More than three times a week  . Frequency of Social Gatherings with Friends and Family: Twice a week  . Attends Religious Services: Never  . Active Member of Clubs or Organizations: Yes  . Attends Archivist Meetings: 1 to 4 times per year  . Marital Status: Divorced  Human resources officer Violence:  Not At Risk  . Fear of Current or Ex-Partner: No  . Emotionally Abused: No  . Physically Abused: No  . Sexually Abused: No     Current Outpatient Medications:  .  levonorgestrel (MIRENA) 20 MCG/24HR IUD, 1 each by Intrauterine route once., Disp: , Rfl:   Allergies  Allergen Reactions  . Diflucan [Fluconazole] Rash  . Tetanus Toxoids Rash     ROS  Constitutional: Negative for fever or weight change.  Respiratory: Negative for cough and shortness of breath.   Cardiovascular: Negative for chest pain or palpitations.  Gastrointestinal: Negative for abdominal pain, no bowel changes.  Musculoskeletal: Negative for gait problem or joint swelling.  Skin: no current  rash.  Neurological: Negative for dizziness or headache.  No other specific complaints in a complete review of systems (except as listed in HPI above).  Objective  Vitals:   11/27/20 1012  BP: 122/70  Pulse: 83  Resp: 16  Temp: 98.6 F (37 C)  TempSrc: Oral  SpO2: 98%  Weight: 158 lb 1.6 oz (71.7 kg)  Height: $Remove'5\' 2"'[id number]$  (1.575 m)    Body mass index is 28.92 kg/m.  Physical Exam  Constitutional: Patient appears well-developed and well-nourished. No distress.  HENT: Head: Normocephalic and atraumatic. Ears: B TMs ok, no erythema or effusion; Nose: Not done . Mouth/Throat:  Not done  Eyes: Conjunctivae and EOM are normal. Pupils are equal, round, and reactive to light. No scleral icterus.  Neck: Normal range of motion. Neck supple. No JVD present. No thyromegaly present.  Cardiovascular: Normal rate, regular rhythm  and normal heart sounds.  No murmur heard. No BLE edema. Pulmonary/Chest: Effort normal and breath sounds normal. No respiratory distress. Abdominal: Soft. Bowel sounds are normal, no distension. There is no tenderness. no masses Breast: multiple small round nodules/ cysts , more prominent on right , s/p breast augmentation ( discussed diagnositc mammogram but she states unchanged and was told they were cysts)  no nipple discharge or rashes FEMALE GENITALIA:  Not done  RECTAL: not done  Musculoskeletal: Normal range of motion, no joint effusions. No gross deformities Neurological: he is alert and oriented to person, place, and time. No cranial nerve deficit. Coordination, balance, strength, speech and gait are normal.  Skin: Skin is warm and dry. No rash noted. No erythema.  Psychiatric: Patient has a normal mood and affect. behavior is normal. Judgment and thought content normal.  Recent Results (from the past 2160 hour(s))  Cervicovaginal ancillary only     Status: Abnormal   Collection Time: 10/27/20  2:47 PM  Result Value Ref Range   Neisseria Gonorrhea Negative    Chlamydia Negative    Trichomonas Negative    Bacterial Vaginitis (gardnerella) Positive (A)    Candida Vaginitis Positive (A)    Candida Glabrata Negative    Comment      Normal Reference Range Bacterial Vaginosis - Negative   Comment Normal Reference Range Candida Species - Negative    Comment Normal Reference Range Candida Galbrata - Negative    Comment Normal Reference Range Trichomonas - Negative    Comment Normal Reference Ranger Chlamydia - Negative    Comment      Normal Reference Range Neisseria Gonorrhea - Negative      Fall Risk: Fall Risk  11/27/2020 10/27/2020 08/24/2020 09/11/2019 06/17/2019  Falls in the past year? 0 0 0 0 0  Number falls in past yr: 0 0 0 0 0  Injury with Fall? 0 0 0 0 0  Follow up Falls evaluation completed Falls evaluation completed - - -     Functional Status Survey: Is the  patient deaf or have difficulty hearing?: No Does the patient have difficulty seeing, even when wearing glasses/contacts?: No Does the patient have difficulty concentrating, remembering, or making decisions?: No Does the patient have difficulty walking or climbing stairs?: No Does the patient have difficulty dressing or bathing?: No Does the patient have difficulty doing errands alone such as visiting a doctor's office or shopping?: No   Assessment & Plan  1. Well adult exam  - Lipid panel - COMPLETE METABOLIC PANEL WITH GFR - CBC with Differential/Platelet - Hemoglobin A1c - Vitamin B12  2. Breast cancer screening by mammogram   3. Diabetes mellitus screening  - Hemoglobin A1c  4. Lipid screening  - Lipid panel  5. B12 deficiency  - Vitamin B12  6. Iron deficiency anemia, unspecified iron deficiency anemia type  - CBC with Differential/Platelet  7. Goiter  - Thyroid Panel With TSH  -USPSTF grade A and B recommendations reviewed with patient; age-appropriate recommendations, preventive care, screening tests, etc discussed and encouraged; healthy living encouraged; see AVS for patient education given to patient -Discussed importance of 150 minutes of physical activity weekly, eat two servings of fish weekly, eat one serving of tree nuts ( cashews, pistachios, pecans, almonds.Marland Kitchen) every other day, eat 6 servings of fruit/vegetables daily and drink plenty of water and avoid sweet beverages.

## 2020-11-27 ENCOUNTER — Ambulatory Visit (INDEPENDENT_AMBULATORY_CARE_PROVIDER_SITE_OTHER): Payer: Managed Care, Other (non HMO) | Admitting: Family Medicine

## 2020-11-27 ENCOUNTER — Other Ambulatory Visit: Payer: Self-pay

## 2020-11-27 ENCOUNTER — Encounter: Payer: Self-pay | Admitting: Family Medicine

## 2020-11-27 VITALS — BP 122/70 | HR 83 | Temp 98.6°F | Resp 16 | Ht 62.0 in | Wt 158.1 lb

## 2020-11-27 DIAGNOSIS — Z13220 Encounter for screening for lipoid disorders: Secondary | ICD-10-CM

## 2020-11-27 DIAGNOSIS — Z1231 Encounter for screening mammogram for malignant neoplasm of breast: Secondary | ICD-10-CM | POA: Diagnosis not present

## 2020-11-27 DIAGNOSIS — Z131 Encounter for screening for diabetes mellitus: Secondary | ICD-10-CM

## 2020-11-27 DIAGNOSIS — E538 Deficiency of other specified B group vitamins: Secondary | ICD-10-CM

## 2020-11-27 DIAGNOSIS — Z0000 Encounter for general adult medical examination without abnormal findings: Secondary | ICD-10-CM

## 2020-11-27 DIAGNOSIS — E049 Nontoxic goiter, unspecified: Secondary | ICD-10-CM

## 2020-11-27 DIAGNOSIS — D509 Iron deficiency anemia, unspecified: Secondary | ICD-10-CM

## 2020-11-28 LAB — COMPLETE METABOLIC PANEL WITH GFR
AG Ratio: 1.4 (calc) (ref 1.0–2.5)
ALT: 7 U/L (ref 6–29)
AST: 16 U/L (ref 10–35)
Albumin: 4.1 g/dL (ref 3.6–5.1)
Alkaline phosphatase (APISO): 86 U/L (ref 31–125)
BUN: 12 mg/dL (ref 7–25)
CO2: 29 mmol/L (ref 20–32)
Calcium: 9.7 mg/dL (ref 8.6–10.2)
Chloride: 104 mmol/L (ref 98–110)
Creat: 0.54 mg/dL (ref 0.50–1.10)
GFR, Est African American: 132 mL/min/{1.73_m2} (ref >=60)
GFR, Est Non African American: 114 mL/min/{1.73_m2} (ref >=60)
Globulin: 2.9 g/dL (calc) (ref 1.9–3.7)
Glucose, Bld: 96 mg/dL (ref 65–99)
Potassium: 4.3 mmol/L (ref 3.5–5.3)
Sodium: 139 mmol/L (ref 135–146)
Total Bilirubin: 0.7 mg/dL (ref 0.2–1.2)
Total Protein: 7 g/dL (ref 6.1–8.1)

## 2020-11-28 LAB — CBC WITH DIFFERENTIAL/PLATELET
Absolute Monocytes: 441 cells/uL (ref 200–950)
Basophils Absolute: 17 cells/uL (ref 0–200)
Basophils Relative: 0.3 %
Eosinophils Absolute: 81 cells/uL (ref 15–500)
Eosinophils Relative: 1.4 %
HCT: 35.8 % (ref 35.0–45.0)
Hemoglobin: 11.4 g/dL — ABNORMAL LOW (ref 11.7–15.5)
Lymphs Abs: 2355 cells/uL (ref 850–3900)
MCH: 28.3 pg (ref 27.0–33.0)
MCHC: 31.8 g/dL — ABNORMAL LOW (ref 32.0–36.0)
MCV: 88.8 fL (ref 80.0–100.0)
MPV: 12.1 fL (ref 7.5–12.5)
Monocytes Relative: 7.6 %
Neutro Abs: 2906 cells/uL (ref 1500–7800)
Neutrophils Relative %: 50.1 %
Platelets: 201 10*3/uL (ref 140–400)
RBC: 4.03 10*6/uL (ref 3.80–5.10)
RDW: 12.3 % (ref 11.0–15.0)
Total Lymphocyte: 40.6 %
WBC: 5.8 10*3/uL (ref 3.8–10.8)

## 2020-11-28 LAB — LIPID PANEL
Cholesterol: 149 mg/dL (ref <200)
HDL: 52 mg/dL (ref >=50)
LDL Cholesterol (Calc): 90 mg/dL (calc)
Non-HDL Cholesterol (Calc): 97 mg/dL (calc) (ref <130)
Total CHOL/HDL Ratio: 2.9 (calc) (ref <5.0)
Triglycerides: 25 mg/dL (ref <150)

## 2020-11-28 LAB — THYROID PANEL WITH TSH
Free Thyroxine Index: 2.1 (ref 1.4–3.8)
T3 Uptake: 30 % (ref 22–35)
T4, Total: 7.1 ug/dL (ref 5.1–11.9)
TSH: 0.89 mIU/L

## 2020-11-28 LAB — HEMOGLOBIN A1C
Hgb A1c MFr Bld: 5.3 % of total Hgb (ref <5.7)
Mean Plasma Glucose: 105 mg/dL
eAG (mmol/L): 5.8 mmol/L

## 2020-11-28 LAB — VITAMIN B12: Vitamin B-12: 227 pg/mL (ref 200–1100)

## 2020-12-25 ENCOUNTER — Ambulatory Visit: Payer: Managed Care, Other (non HMO) | Admitting: Certified Nurse Midwife

## 2020-12-25 ENCOUNTER — Other Ambulatory Visit (HOSPITAL_COMMUNITY)
Admission: RE | Admit: 2020-12-25 | Discharge: 2020-12-25 | Disposition: A | Discharge status: Home / Self Care | Payer: Managed Care, Other (non HMO) | Source: Ambulatory Visit | Attending: Certified Nurse Midwife | Admitting: Certified Nurse Midwife

## 2020-12-25 ENCOUNTER — Encounter: Payer: Self-pay | Admitting: Certified Nurse Midwife

## 2020-12-25 ENCOUNTER — Other Ambulatory Visit: Payer: Self-pay

## 2020-12-25 VITALS — BP 132/78 | HR 68 | Resp 16 | Ht 64.5 in | Wt 159.3 lb

## 2020-12-25 DIAGNOSIS — B9689 Other specified bacterial agents as the cause of diseases classified elsewhere: Secondary | ICD-10-CM | POA: Diagnosis not present

## 2020-12-25 DIAGNOSIS — B373 Candidiasis of vulva and vagina: Secondary | ICD-10-CM | POA: Diagnosis not present

## 2020-12-25 DIAGNOSIS — Z975 Presence of (intrauterine) contraceptive device: Secondary | ICD-10-CM

## 2020-12-25 DIAGNOSIS — N921 Excessive and frequent menstruation with irregular cycle: Secondary | ICD-10-CM | POA: Diagnosis not present

## 2020-12-25 DIAGNOSIS — Z8742 Personal history of other diseases of the female genital tract: Secondary | ICD-10-CM

## 2020-12-25 DIAGNOSIS — N760 Acute vaginitis: Secondary | ICD-10-CM | POA: Diagnosis not present

## 2020-12-25 NOTE — Progress Notes (Signed)
GYN ENCOUNTER NOTE  Subjective:       Nicole Roman is a 45 y.o. [phone] female is here for gynecologic evaluation of the following issues:  Reports continuous spotting with IUD and two episodes of bleeding with clots last month; recently completed treatment for vaginitis  Denies difficulty breathing or respiratory distress, chest pain, abdominal pain, excessive vaginal bleeding, dysuria, and leg pain or swelling.    Gynecologic History  No LMP recorded. (Menstrual status: IUD).  Contraception: IUD, Mirena inserted 09/2019  Last Pap: 07/2018. Results were: Neg/Neg  Last mammogram: 01/2020. Results were: BI-RADS 1  Obstetric History  OB History  Gravida Para Term Preterm AB Living  3       1 2   SAB IAB Ectopic Multiple Live Births  1       2    # Outcome Date GA Lbr Len/2nd Weight Sex Delivery Anes PTL Lv  3 Gravida 2004    F Vag-Spont   LIV  2 Gravida 1996    M Vag-Spont   LIV  1 SAB             Past Medical History:  Diagnosis Date   Breast lump    Iron deficiency anemia    Vaginal Pap smear, abnormal     Past Surgical History:  Procedure Laterality Date   AUGMENTATION MAMMAPLASTY Bilateral 2019   breast inplant Bilateral 07/30/2017   Dr. Aretha Parrot with [facility] in [place] N/A 01/15/2020   Procedure: COLONOSCOPY WITH PROPOFOL;  Surgeon: Virgel Manifold, MD;  Location: [facility] ENDOSCOPY;  Service: Gastroenterology;  Laterality: N/A;   LEEP  2002   tummy tuck  07/30/2017   Dr. Aretha Parrot with [facility] in [place]    Current Outpatient Medications on File Prior to Visit  Medication Sig Dispense Refill   levonorgestrel (MIRENA) 20 MCG/24HR IUD 1 each by Intrauterine route once.     No current facility-administered medications on file prior to visit.    Allergies  Allergen Reactions   Diflucan [Fluconazole] Rash   Tetanus Toxoids Rash    Social History   Socioeconomic History   Marital status: Divorced    Spouse  name: Not on file   Number of children: 2   Years of education: Not on file   Highest education level: Some college, no degree  Occupational History   Not on file  Tobacco Use   Smoking status: Never   Smokeless tobacco: Never  Vaping Use   Vaping Use: Never used  Substance and Sexual Activity   Alcohol use: Yes    Comment: occasionally   Drug use: No   Sexual activity: Yes    Partners: Male    Birth control/protection: I.U.D.  Other Topics Concern   Not on file  Social History Narrative   Patient is originally from [place]   Dating for many years   Social Determinants of Health   Financial Resource Strain: Medium Risk   Difficulty of Paying Living Expenses: Somewhat hard  Food Insecurity: No Food Insecurity   Worried About Charity fundraiser in the Last Year: Never true   Arboriculturist in the Last Year: Never true  Transportation Needs: No Transportation Needs   Lack of Transportation (Medical): No   Lack of Transportation (Non-Medical): No  Physical Activity: Sufficiently Active   Days of Exercise per Week: 3 days   Minutes of Exercise per Session: 60 min  Stress: No Stress Concern Present  Feeling of Stress : Only a little  Social Connections: Moderately Isolated   Frequency of Communication with Friends and Family: More than three times a week   Frequency of Social Gatherings with Friends and Family: Twice a week   Attends Religious Services: Never   Marine scientist or Organizations: Yes   Attends Music therapist: 1 to 4 times per year   Marital Status: Divorced  Human resources officer Violence: Not At Risk   Fear of Current or Ex-Partner: No   Emotionally Abused: No   Physically Abused: No   Sexually Abused: No    Family History  Problem Relation Age of Onset   Cancer Mother        colon   Hypertension Mother    Diabetes Father    Breast cancer Neg Hx     The following portions of the patient's history were reviewed and updated as  appropriate: allergies, current medications, past family history, past medical history, past social history, past surgical history and problem list.  Review of Systems  ROS negative except as noted above. Information obtained from patient.   Objective:   BP 132/78   Pulse 68   Resp 16   Ht 5' 4.5" (1.638 m)   Wt 159 lb 4.8 oz (72.3 kg)   BMI 26.92 kg/m   CONSTITUTIONAL: Well-developed, well-nourished female in no acute distress.   PELVIC:  External Genitalia: Normal  BUS: Normal  Vagina: Normal  Cervix: Normal, IUD string present, swab collected  Uterus: Normal size, shape,consistency, mobile  Adnexa: Normal   MUSCULOSKELETAL: Normal range of motion. No tenderness.  No cyanosis, clubbing, or edema.   Assessment:   1. Breakthrough bleeding associated with intrauterine device (IUD)  - US PELVIC COMPLETE WITH TRANSVAGINAL; Future - Cervicovaginal ancillary only  2. History of vaginitis  - Cervicovaginal ancillary only     Plan:   Vaginal swab collected, see orders.   Ultrasound ordered, see chart.   Reviewed red flag symptoms and when to call.   RTC if symptoms worsen or fail to improve.    Dani Gobble, CNM [facility] 12/25/20 4:34 PM

## 2020-12-25 NOTE — Patient Instructions (Signed)
Levonorgestrel Intrauterine Device What is this medication? LEVONORGESTREL (LEE voe nor jes trel) prevents ovulation and pregnancy. It may also be used to treat heavy periods. It belongs to a group of medicationscalled contraceptives. This medication is a progestin hormone. This medicine may be used for other purposes; ask your health care provider orpharmacist if you have questions. COMMON BRAND NAME(S): Minette Headland What should I tell my care team before I take this medication? They need to know if you have any of these conditions: Abnormal Pap smear Cancer of the breast, uterus, or cervix Diabetes Endometritis Genital or pelvic infection now or in the past Have more than one sexual partner or your partner has more than one partner Heart disease History of an ectopic or tubal pregnancy Immune system problems IUD in place Liver disease or tumor Problems with blood clots or take blood-thinners Seizures Use intravenous drugs Uterus of unusual shape Vaginal bleeding that has not been explained An unusual or allergic reaction to levonorgestrel, other hormones, silicone, or polyethylene, medications, foods, dyes, or preservatives Pregnant or trying to get pregnant Breast-feeding How should I use this medication? This device is placed inside the uterus by your care team. A patient package insert for the product will be given each time it is inserted. Be sure to read this information carefully each time. The sheet maychange often. Talk to your care team about use of this medication in children. Special caremay be needed. Overdosage: If you think you have taken too much of this medicine contact apoison control center or emergency room at once. NOTE: This medicine is only for you. Do not share this medicine with others. What if I miss a dose? This does not apply. Depending on the brand of device you have inserted, the device will need to be replaced every 3 to 7 years if  you wish to continueusing this type of birth control. What may interact with this medication? Interactions are not expected. Tell your care team about all the medicationsyou take. This list may not describe all possible interactions. Give your health care provider a list of all the medicines, herbs, non-prescription drugs, or dietary supplements you use. Also tell them if you smoke, drink alcohol, or use illegaldrugs. Some items may interact with your medicine. What should I watch for while using this medication? Visit your care team for regular check-ups. Tell your care team if you or yourpartner becomes HIV positive or gets a sexually transmitted disease. This product does not protect you against HIV infection (AIDS) or othersexually transmitted diseases. You can check the placement of the IUD yourself by reaching up to the top of your vagina with clean fingers to feel the threads. Do not pull on the threads. It is a good habit to check placement after each menstrual period. Call your care team right away if you feel more of the IUD than just the threads or ifyou cannot feel the threads at all. The IUD may come out by itself. You may become pregnant if the device comes out. If you notice that the IUD has come out use a backup birth control methodlike condoms and call your care team. Using tampons will not change the position of the IUD and are okay to useduring your period. This IUD can be safely scanned with magnetic resonance imaging (MRI) only under specific conditions. Before you have an MRI, tell your care team that you havean IUD in place, and which type of IUD you have in place. What side  effects may I notice from receiving this medication? Side effects that you should report to your care team as soon as possible: Allergic reactions-skin rash, itching, hives, swelling of the face, lips, tongue, or throat Blood clot-pain, swelling, or warmth in the leg, shortness of breath, chest  pain Gallbladder problems-severe stomach pain, nausea, vomiting, fever Increase in blood pressure Liver injury-right upper belly pain, loss of appetite, nausea, light-colored stool, dark yellow or brown urine, yellowing skin or eyes, unusual weakness or fatigue New or worsening migraines or headaches Pelvic inflammatory disease (PID)-fever, abdominal pain, pelvic pain, pain or trouble passing urine, spotting, bleeding during or after sex Stroke-sudden numbness or weakness of the face, arm, or leg, trouble speaking, confusion, trouble walking, loss of balance or coordination, dizziness, severe headache, change in vision Unusual vaginal discharge, itching, or odor Vaginal pain, irritation, or sores Worsening mood, feelings of depression Side effects that usually do not require medical attention (report to your careteam if they continue or are bothersome): Breast pain or tenderness Dark patches of skin on the face or other sun-exposed areas Irregular menstrual cycles or spotting Nausea Weight gain This list may not describe all possible side effects. Call your doctor for medical advice about side effects. You may report side effects to FDA at1-800-FDA-1088. Where should I keep my medication? This does not apply. NOTE: This sheet is a summary. It may not cover all possible information. If you have questions about this medicine, talk to your doctor, pharmacist, orhealth care provider.  2022 Elsevier/Gold Standard (2020-08-10 16:46:12)    Vaginitis  Vaginitis is irritation and swelling of the vagina. Treatment will depend onthe cause. What are the causes? It can be caused by: Bacteria. Yeast. A parasite. A virus. Low hormone levels. Bubble baths, scented tampons, and feminine sprays. Other things can change the balance of the yeast and bacteria that live in the vagina. These include: Antibiotic medicines. Not being clean enough. Some birth control  methods. Sex. Infection. Diabetes. A weakened body defense system (immune system). What increases the risk? Smoking or being around someone who smokes. Using washes (douches), scented tampons, or scented pads. Wearing tight pants or thong underwear. Using birth control pills or an IUD. Having sex without a condom or having a lot of partners. Having an STI. Using a certain product to kill sperm (nonoxynol-9). Eating foods that are high in sugar. Having diabetes. Having low levels of a female hormone. Having a weakened body defense system. Being pregnant or breastfeeding. What are the signs or symptoms? Fluid coming from the vagina that is not normal. A bad smell. Itching, pain, or swelling. Pain with sex. Pain or burning when you pee (urinate). Sometimes there are no symptoms. How is this treated? Treatment may include: Antibiotic creams or pills. Antifungal medicines. Medicines to ease symptoms if you have a virus. Your sex partner should also be treated. Estrogen medicines. Avoiding scented soaps, sprays, or douches. Stopping use of products that caused irritation and then using a cream to treat symptoms. Follow these instructions at home: Lifestyle Keep the area around your vagina clean and dry. Avoid using soap. Rinse the area with water. Until your doctor says it is okay: Do not use washes for the vagina. Do not use tampons. Do not have sex. Wipe from front to back after going to the bathroom. When your doctor says it is okay, practice safe sex and use condoms. General instructions Take over-the-counter and prescription medicines only as told by your doctor. If you were prescribed an  antibiotic medicine, take or use it as told by your doctor. Do not stop taking or using it even if you start to feel better. Keep all follow-up visits. How is this prevented? Do not use things that can irritate the vagina, such as fabric softeners. Avoid these products if they are  scented: Sprays. Detergents. Tampons. Products for cleaning the vagina. Soaps or bubble baths. Let air reach your vagina. To do this: Wear cotton underwear. Do not wear: Underwear while you sleep. Tight pants. Thong underwear. Underwear or nylons without a cotton panel. Take off any wet clothing, such as bathing suits, as soon as you can. Practice safe sex and use condoms. Contact a doctor if: You have pain in your belly or in the area between your hips. You have a fever or chills. Your symptoms last for more than 2-3 days. Get help right away if: You have a fever and your symptoms get worse all of a sudden. Summary Vaginitis is irritation and swelling of the vagina. Treatment will depend on the cause of the condition. Do not use washes or tampons or have sex until your doctor says it is okay. This information is not intended to replace advice given to you by your health care provider. Make sure you discuss any questions you have with your healthcare provider. Document Revised: 01/02/2020 Document Reviewed: 01/02/2020 Elsevier Patient Education  [place].

## 2020-12-29 LAB — CERVICOVAGINAL ANCILLARY ONLY
Bacterial Vaginitis (gardnerella): POSITIVE — AB
Candida Glabrata: NEGATIVE
Candida Vaginitis: POSITIVE — AB
Comment: NEGATIVE
Comment: NEGATIVE
Comment: NEGATIVE

## 2021-01-03 ENCOUNTER — Other Ambulatory Visit: Payer: Self-pay | Admitting: Certified Nurse Midwife

## 2021-01-03 DIAGNOSIS — B373 Candidiasis of vulva and vagina: Secondary | ICD-10-CM

## 2021-01-03 DIAGNOSIS — B3731 Acute candidiasis of vulva and vagina: Secondary | ICD-10-CM

## 2021-01-03 DIAGNOSIS — B9689 Other specified bacterial agents as the cause of diseases classified elsewhere: Secondary | ICD-10-CM

## 2021-01-03 MED ORDER — METRONIDAZOLE 0.75 % VA GEL
1.0000 | Freq: Every day | VAGINAL | 5 refills | Status: DC
Start: 2021-01-03 — End: 2021-02-05

## 2021-01-03 NOTE — Progress Notes (Signed)
Rx Metrogel, see orders.    Dani Gobble, CNM [facility], [facility] 01/03/21 8:14 PM

## 2021-01-19 ENCOUNTER — Ambulatory Visit
Admission: RE | Admit: 2021-01-19 | Discharge: 2021-01-19 | Disposition: A | Discharge status: Home / Self Care | Payer: Managed Care, Other (non HMO) | Source: Ambulatory Visit | Attending: Family Medicine | Admitting: Family Medicine

## 2021-01-19 ENCOUNTER — Other Ambulatory Visit: Payer: Self-pay

## 2021-01-19 DIAGNOSIS — Z1231 Encounter for screening mammogram for malignant neoplasm of breast: Secondary | ICD-10-CM | POA: Diagnosis not present

## 2021-01-27 ENCOUNTER — Ambulatory Visit
Admission: RE | Admit: 2021-01-27 | Discharge: 2021-01-27 | Disposition: A | Discharge status: Home / Self Care | Payer: Managed Care, Other (non HMO) | Source: Ambulatory Visit | Attending: Certified Nurse Midwife | Admitting: Certified Nurse Midwife

## 2021-01-27 ENCOUNTER — Other Ambulatory Visit: Payer: Self-pay

## 2021-01-27 DIAGNOSIS — Z975 Presence of (intrauterine) contraceptive device: Secondary | ICD-10-CM | POA: Diagnosis present

## 2021-01-27 DIAGNOSIS — N921 Excessive and frequent menstruation with irregular cycle: Secondary | ICD-10-CM | POA: Diagnosis not present

## 2021-01-29 ENCOUNTER — Encounter: Payer: Self-pay | Admitting: Certified Nurse Midwife

## 2021-01-29 DIAGNOSIS — D259 Leiomyoma of uterus, unspecified: Secondary | ICD-10-CM | POA: Insufficient documentation

## 2021-01-29 DIAGNOSIS — T8332XA Displacement of intrauterine contraceptive device, initial encounter: Secondary | ICD-10-CM | POA: Insufficient documentation

## 2021-02-05 ENCOUNTER — Encounter: Payer: Self-pay | Admitting: Obstetrics and Gynecology

## 2021-02-05 ENCOUNTER — Ambulatory Visit: Payer: Managed Care, Other (non HMO) | Admitting: Obstetrics and Gynecology

## 2021-02-05 ENCOUNTER — Other Ambulatory Visit: Payer: Self-pay | Admitting: Obstetrics and Gynecology

## 2021-02-05 ENCOUNTER — Other Ambulatory Visit: Payer: Self-pay

## 2021-02-05 VITALS — BP 154/89 | HR 89 | Resp 16 | Ht 64.0 in | Wt 158.3 lb

## 2021-02-05 DIAGNOSIS — N921 Excessive and frequent menstruation with irregular cycle: Secondary | ICD-10-CM | POA: Diagnosis not present

## 2021-02-05 DIAGNOSIS — Z8742 Personal history of other diseases of the female genital tract: Secondary | ICD-10-CM

## 2021-02-05 DIAGNOSIS — N83202 Unspecified ovarian cyst, left side: Secondary | ICD-10-CM

## 2021-02-05 DIAGNOSIS — Z975 Presence of (intrauterine) contraceptive device: Secondary | ICD-10-CM

## 2021-02-05 DIAGNOSIS — Z30433 Encounter for removal and reinsertion of intrauterine contraceptive device: Secondary | ICD-10-CM

## 2021-02-05 DIAGNOSIS — T8332XD Displacement of intrauterine contraceptive device, subsequent encounter: Secondary | ICD-10-CM

## 2021-02-05 MED ORDER — HYLAFEM PH VA SUPP: 1.0000 | Freq: Every day | VAGINAL | 3 refills | Status: DC

## 2021-02-05 NOTE — Progress Notes (Signed)
GYNECOLOGY CLINIC PROGRESS NOTE  Subjective:    Patient ID: Nicole Roman, female    DOB: 1976/02/25, 45 y.o.   MRN: [id number]  HPI  Patient is a 45 y.o. [id number] female who presents for IUD removal and insert. She had a removal and replaced last year. She had an ultrasound recently due to complaints of abnormal bleeding and clots and it revealed: IUD in place, and appears obliquely oriented and partially imbedded within the uterine myometrium. So she would like it to be removed and replaced today.  She also has a question regarding yeast infections. She feels as though she has been having recurrent yeast infections. She has been treating symptoms with Monistat 1, but symptoms continue to come and go. She has an allergy to Diflucan which causes her to have a rash.   The following portions of the patient's history were reviewed and updated as appropriate: allergies, current medications, past family history, past medical history, past social history, past surgical history, and problem list.  Review of Systems Pertinent items noted in HPI and remainder of comprehensive ROS otherwise negative.   Objective:   Blood pressure (!) 154/89, pulse 89, resp. rate 16, height '5\' 4"'$  (1.626 m), weight 158 lb 4.8 oz (71.8 kg), last menstrual period 01/06/2021. Body mass index is 27.17 kg/m. General appearance: alert and no distress Abdomen: soft, non-tender; bowel sounds normal; no masses,  no organomegaly Pelvic: external genitalia normal, rectovaginal septum normal.  Vagina without discharge, scant dark red blood in vault. Cervix normal appearing, no lesions and no motion tenderness.  Uterus mobile, nontender, normal shape and size.  Adnexae non-palpable, nontender bilaterally.     Imaging:  US PELVIC COMPLETE WITH TRANSVAGINAL CLINICAL DATA:  Initial evaluation for breakthrough bleeding, IUD.  EXAM: TRANSABDOMINAL AND TRANSVAGINAL ULTRASOUND OF PELVIS  TECHNIQUE: Both transabdominal and  transvaginal ultrasound examinations of the pelvis were performed. Transabdominal technique was performed for global imaging of the pelvis including uterus, ovaries, adnexal regions, and pelvic cul-de-sac. It was necessary to proceed with endovaginal exam following the transabdominal exam to visualize the uterus, endometrium, and ovaries.  COMPARISON:  Ultrasound from 10/10/2019.  FINDINGS: Uterus  Measurements: 10.4 x 6.9 x 7.3 cm = volume: 274.6 mL. Uterus is anteverted. 3.4 x 3.4 x 3.1 cm intramural fibroid present at the anterior uterine fundus. 2.9 x 2.7 x 2.7 cm intramural fibroid present at the right posterior uterine body.  Endometrium  Thickness: 4 mm. There is question of a focal echogenic lesion within the endometrial cavity measuring approximately 1 cm (image 96). Possible associated vascularity (image 97). Adjacent endometrial cavity mildly distended with a small amount of fluid. IUD in place, and appears to be somewhat obliquely positioned, partially imbedded in the adjacent uterine myometrium.  Right ovary  Measurements: 3.4 x 1.5 x 2.3 cm = volume: 7.0 mL. 1.8 cm degenerating right ovarian corpus luteal cyst. No other adnexal mass.  Left ovary  Measurements: 4.3 x 3.1 x 2.7 cm = volume: 18.8 mL. 3.4 cm predominantly simple left ovarian cyst. Minimal layering internal echogenic material could reflect blood products and/or debris. No internal vascularity or solid component.  Other findings  No abnormal free fluid.  IMPRESSION: 1. Question 1 cm polypoid lesion involving the endometrial complex, which could reflect a small endometrial polyp. Underlying endometrial stripe measures 4 mm in thickness. Consider further evaluation with sonohysterogram for confirmation prior to hysteroscopy. Endometrial sampling should also be considered if patient is at high risk for endometrial carcinoma. (Ref: Radiological  Reasoning: Algorithmic Workup of Abnormal  Vaginal Bleeding with Endovaginal Sonography and Sonohysterography. AJR [id number]). 2. IUD in place, and appears obliquely oriented and partially imbedded within the uterine myometrium. 3. Underlying fibroid uterus as above. 4. 3.4 cm simple left ovarian cyst, benign in appearance. No follow up imaging recommended. Note: This recommendation does not apply to premenarchal patients or to those with increased risk (genetic, family history, elevated tumor markers or other high-risk factors) of ovarian cancer. Reference: Radiology 2019 Nov; 293(2):359-371.  Electronically Signed   By: Jeannine Boga M.D.   On: 01/28/2021 19:34  Assessment:   1. Encounter for IUD removal and reinsertion   2. Malpositioned intrauterine device (IUD), subsequent encounter   3. Breakthrough bleeding associated with intrauterine device (IUD)   4. History of vaginitis   5. Left ovarian cyst      Plan:  IUD removed and reinserted today, see procedure note below. RTC in 4 weeks for IUD check. Breakthrough bleeding also may be caused by possible endometrial polyp. If not regulated by IUD, may need to discuss further management.  Reviewed [place],  Advised on methods of prevention of recurrent vaginitis, including eating healthy diet (reducing sugar intake, increasing water intake), supporting healthy immune system, eating yogurt or taking a probiotic, avoiding harsh soaps/detergents, more frequent changing of underwear when sweating, and avoiding douching. Also discussed use of using the Monistat 7 for full treatment, and use of boric acid suppositories for residual symptoms. . Can prescribe.  Small left ovarian cyst, could cause some mild pelvic pain or discomfort, but will likely resolve without any intervention. No further f/u needed.     GYNECOLOGY OFFICE PROCEDURE NOTE  Nicole Roman is a 45 y.o. [phone] here for Mirena IUD removal and reinsertion. See above HPI for listed GYN concerns Last pap  smear was on 07/25/2018 and was normal.  IUD Removal and Reinsertion  Patient identified, informed consent performed, consent signed.   Discussed risks of irregular bleeding, cramping, infection, malpositioning or misplacement of the IUD outside the uterus which may require further procedures. Also discussed >99% contraception efficacy, increased risk of ectopic pregnancy with failure of method.   Emphasized that this did not protect against STIs, condoms recommended during all sexual encounters.Advised to use backup contraception for one week as the risk of pregnancy is higher during the transition period of removing an IUD and replacing it with another one. Time out was performed. Speculum placed in the vagina. The strings of the IUD were grasped and pulled using ring forceps. The IUD was successfully removed in its entirety. The cervix was cleaned with Betadine x 2 and grasped anteriorly with a single tooth tenaculum.  The new Mirena IUD insertion apparatus was used to sound the uterus to 10 cm;  the IUD was then placed per manufacturer's recommendations. Strings trimmed to 3 cm. Tenaculum was removed, good hemostasis noted. Patient tolerated procedure well.   Patient was given post-procedure instructions.  She was reminded to have backup contraception for one week during this transition period between IUDs.  Patient was also asked to check IUD strings periodically and follow up in 4 weeks for IUD check.   LOT: [id number] Exp: 02/2023   Rubie Maid, MD [facility]

## 2021-02-05 NOTE — Patient Instructions (Signed)
Vaginal Yeast Infection, Adult  Vaginal yeast infection is a condition that causes vaginal discharge as well as soreness, swelling, and redness (inflammation) of the vagina. This is a common condition. Some women get this infectionfrequently. What are the causes? This condition is caused by a change in the normal balance of the yeast (candida) and bacteria that live in the vagina. This change causes an overgrowth ofyeast, which causes the inflammation. What increases the risk? The condition is more likely to develop in women who: Take antibiotic medicines. Have diabetes. Take birth control pills. Are pregnant. Douche often. Have a weak body defense system (immune system). Have been taking steroid medicines for a long time. Frequently wear tight clothing. What are the signs or symptoms? Symptoms of this condition include: White, thick, creamy vaginal discharge. Swelling, itching, redness, and irritation of the vagina. The lips of the vagina (vulva) may be affected as well. Pain or a burning feeling while urinating. Pain during sex. How is this diagnosed? This condition is diagnosed based on: Your medical history. A physical exam. A pelvic exam. Your health care provider will examine a sample of your vaginal discharge under a microscope. Your health care provider may send this sample for testing to confirm the diagnosis. How is this treated? This condition is treated with medicine. Medicines may be over-the-counter or prescription. You may be told to use one or more of the following: Medicine that is taken by mouth (orally). Medicine that is applied as a cream (topically). Medicine that is inserted directly into the vagina (suppository). Follow these instructions at home:  Lifestyle Do not have sex until your health care provider approves. Tell your sex partner that you have a yeast infection. That person should go to his or her health care provider and ask if they should also be  treated. Do not wear tight clothes, such as pantyhose or tight pants. Wear breathable cotton underwear. General instructions Take or apply over-the-counter and prescription medicines only as told by your health care provider. Eat more yogurt. This may help to keep your yeast infection from returning. Do not use tampons until your health care provider approves. Try taking a sitz bath to help with discomfort. This is a warm water bath that is taken while you are sitting down. The water should only come up to your hips and should cover your buttocks. Do this 3-4 times per day or as told by your health care provider. Do not douche. If you have diabetes, keep your blood sugar levels under control. Keep all follow-up visits as told by your health care provider. This is important. Contact a health care provider if: You have a fever. Your symptoms go away and then return. Your symptoms do not get better with treatment. Your symptoms get worse. You have new symptoms. You develop blisters in or around your vagina. You have blood coming from your vagina and it is not your menstrual period. You develop pain in your abdomen. Summary Vaginal yeast infection is a condition that causes discharge as well as soreness, swelling, and redness (inflammation) of the vagina. This condition is treated with medicine. Medicines may be over-the-counter or prescription. Take or apply over-the-counter and prescription medicines only as told by your health care provider. Do not douche. Do not have sex or use tampons until your health care provider approves. Contact a health care provider if your symptoms do not get better with treatment or your symptoms go away and then return. This information is not intended to replace   advice given to you by your health care provider. Make sure you discuss any questions you have with your healthcare provider. Document Revised: 06/24/2020 Document Reviewed: 11/20/2017 Elsevier Patient  Education  2022 [place]. IUD PLACEMENT POST-PROCEDURE INSTRUCTIONS  You may take Ibuprofen, Aleve or Tylenol for pain if needed.  Cramping should resolve within in 24 hours.  You may have a small amount of spotting.  You should wear a mini pad for the next few days.  You may have intercourse after 24 hours.  If you using this for birth control, it is effective immediately.  You need to call if you have any pelvic pain, fever, heavy bleeding or foul smelling vaginal discharge.  Irregular bleeding is common the first several months after having an IUD placed. You do not need to call for this reason unless you are concerned.  Shower or bathe as normal  You should have a follow-up appointment in 4-8 weeks for a re-check to make sure you are not having any problems.

## 2021-02-05 NOTE — Progress Notes (Deleted)
        GYNECOLOGY OFFICE PROCEDURE NOTE  Nicole Roman is a 45 y.o. [phone] here for *** IUD insertion. No GYN concerns.  Last pap smear was on *** and was normal.  IUD Insertion Procedure Note Patient identified, informed consent performed, consent signed.   Discussed risks of irregular bleeding, cramping, infection, malpositioning or misplacement of the IUD outside the uterus which may require further procedure such as laparoscopy. Also discussed >99% contraception efficacy, increased risk of ectopic pregnancy with failure of method.   Emphasized that this did not protect against STIs, condoms recommended during all sexual encounters. Time out was performed.  Urine pregnancy test negative.  Speculum placed in the vagina.  Cervix visualized.  Cleaned with Betadine x 2.  Grasped anteriorly with a single tooth tenaculum.  Uterus sounded to *** cm.  *** IUD placed per manufacturer's recommendations.  Strings trimmed to 3 cm. Tenaculum was removed, good hemostasis noted.  Patient tolerated procedure well.   Patient was given post-procedure instructions.  She was advised to have backup contraception for one week.  Patient was also asked to check IUD strings periodically and follow up in 4 weeks for IUD check.   [name], MD, [place] for [employer], [place]

## 2021-02-12 ENCOUNTER — Encounter: Payer: Managed Care, Other (non HMO) | Admitting: Certified Nurse Midwife

## 2021-03-09 NOTE — Progress Notes (Signed)
    GYNECOLOGY PROGRESS NOTE  Subjective:    Patient ID: Nicole Roman, female    DOB: 05-16-1976, 45 y.o.   MRN: [id number]  HPI Patient is a 45 y.o. [id number] female who presents for IUD string check. Recently had IUD replaced due to findings of prior IUD being malpositioned (discovered on ultrasound due to complaints of abnormal bleeding and clots; findings revealed IUD in place, appeared obliquely oriented and partially imbedded within the uterine myometrium).  IUD was removed and reinserted on 02/05/2021. She reports today that clotting has resolved. She has some spotting to light bleedingfor the past week. Her LMP was 03/03/2021.  Also noted spotting the week the IUD was inserted.    The following portions of the patient's history were reviewed and updated as appropriate: allergies, current medications, past family history, past medical history, past social history, past surgical history, and problem list.  Review of Systems Pertinent items noted in HPI and remainder of comprehensive ROS otherwise negative.   Objective:   Blood pressure 138/83, pulse 67, resp. rate 16, height '5\' 4"'$  (1.626 m), weight 160 lb 11.2 oz (72.9 kg), last menstrual period 03/03/2021.   General appearance: alert and no distress Abdomen: soft, non-tender; bowel sounds normal; no masses,  no organomegaly Pelvic: external genitalia normal, rectovaginal septum normal.  Vagina with small amount of red-brown thin watery blood.  Cervix normal appearing, no lesions and no motion tenderness.  IUD threads visualized, 3 cm in length. Bimanual exam not performed.  Extremities: extremities normal, atraumatic, no cyanosis or edema Neurologic: Grossly normal   Assessment:   1. IUD check up   2. Vaginal spotting      Plan:   No overall concerning issues with the IUD.  Can remain in place for up to 7 years.  Discussed management of persistent spotting, advised that this can be normal, however patient also had irregular  bleeding with other IUD as well. Will do estrogen trial with Balcoltra (to take x 1 month to help with breakthrough bleeding, given sample).  No risk factors noted besides age as a contraindication to estrogen. Can also utilize NSAIDs.    Rubie Maid, MD [facility]

## 2021-03-10 ENCOUNTER — Ambulatory Visit: Payer: Managed Care, Other (non HMO) | Admitting: Obstetrics and Gynecology

## 2021-03-10 ENCOUNTER — Other Ambulatory Visit: Payer: Self-pay

## 2021-03-10 ENCOUNTER — Encounter: Payer: Self-pay | Admitting: Obstetrics and Gynecology

## 2021-03-10 VITALS — BP 138/83 | HR 67 | Resp 16 | Ht 64.0 in | Wt 160.7 lb

## 2021-03-10 DIAGNOSIS — Z30431 Encounter for routine checking of intrauterine contraceptive device: Secondary | ICD-10-CM | POA: Diagnosis not present

## 2021-03-10 DIAGNOSIS — N939 Abnormal uterine and vaginal bleeding, unspecified: Secondary | ICD-10-CM | POA: Diagnosis not present

## 2021-05-01 IMAGING — MG DIGITAL SCREENING BILATERAL MAMMOGRAM WITH IMPLANTS, CAD AND TOM
9 of 19 series · 9 of 39 positions shown · non-contrast
Comparison: Previous exam(s).

CLINICAL DATA: Screening.

EXAM:
DIGITAL SCREENING BILATERAL MAMMOGRAM WITH IMPLANTS, CAD AND TOMO
The patient has retropectoral implants. Standard and implant
displaced views were performed.

[R CC]
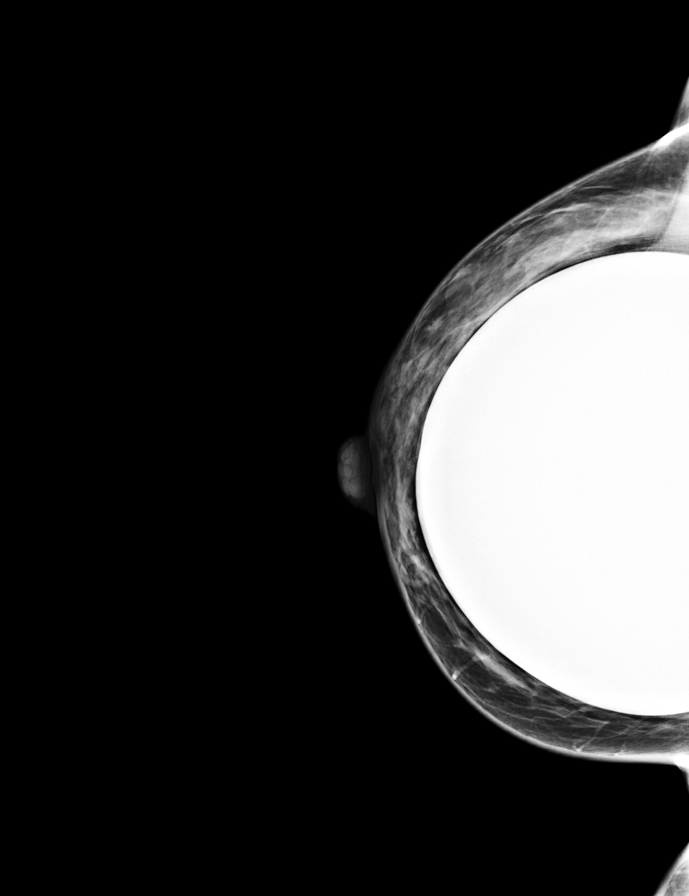

[R MLO (1 of 2)]
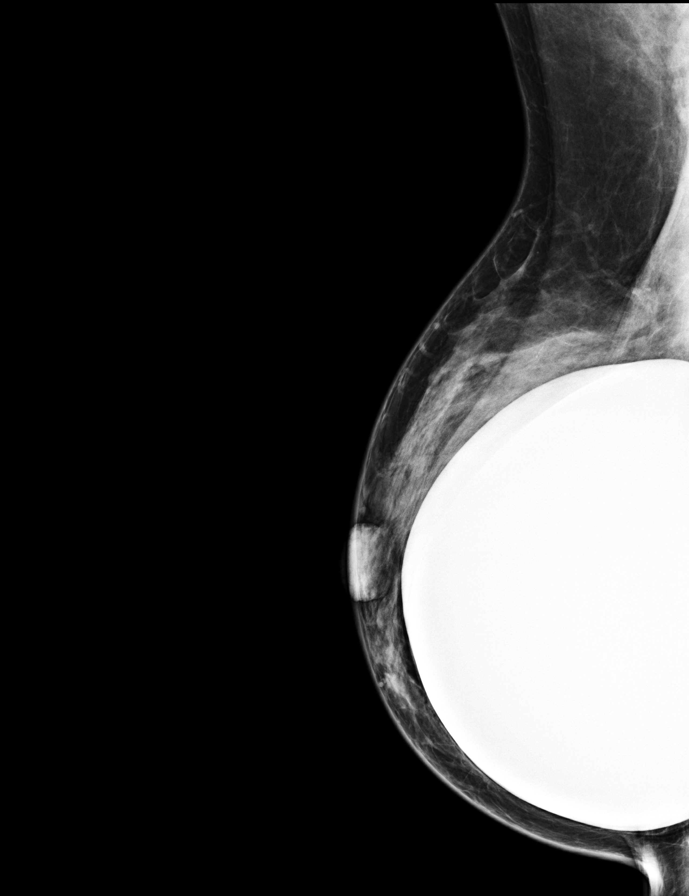

[L MLO]
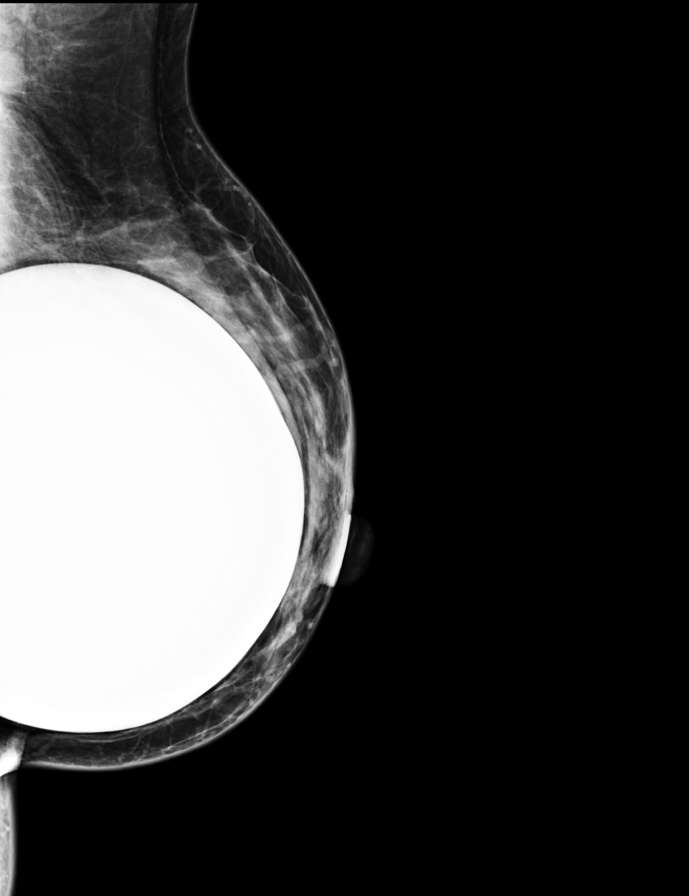

[L CC]
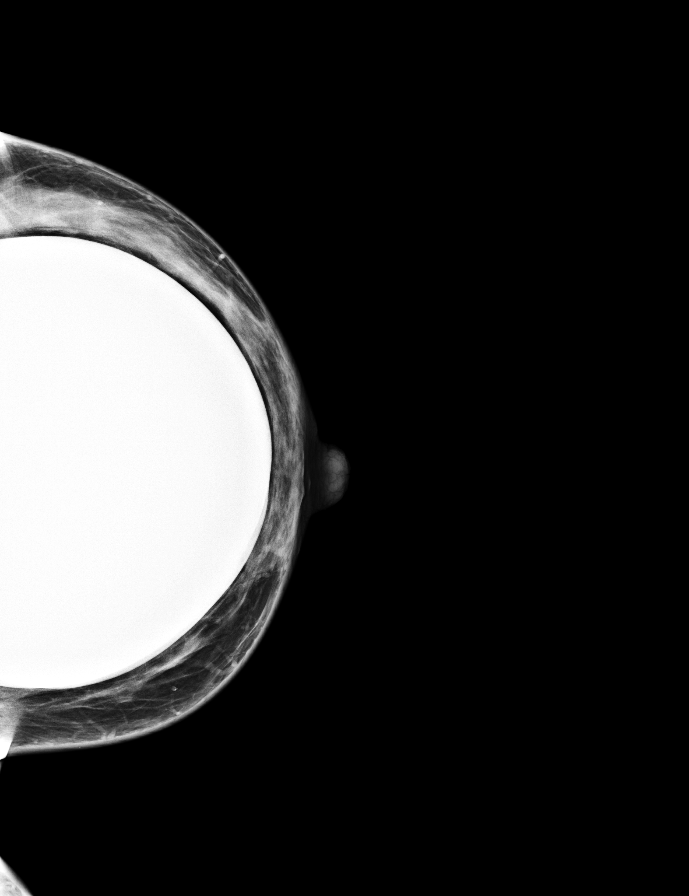

[L CC synth-2D]
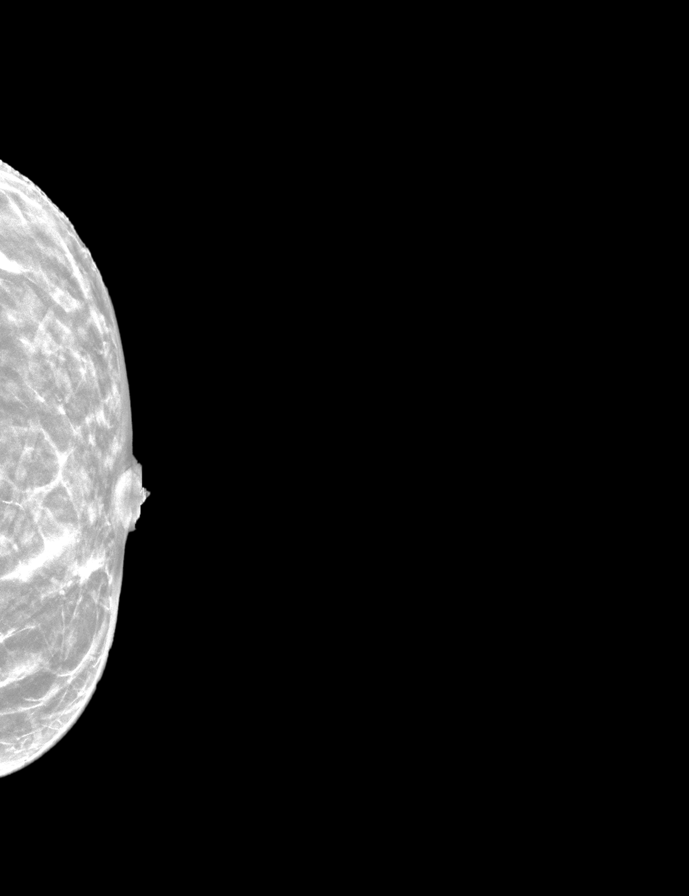

[R CC synth-2D]
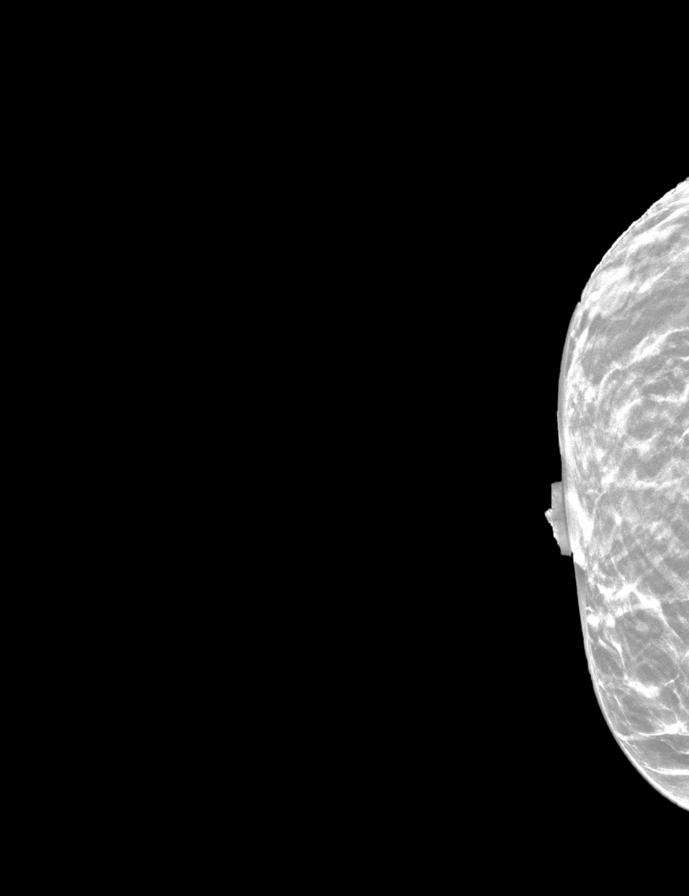

[R MLO synth-2D]
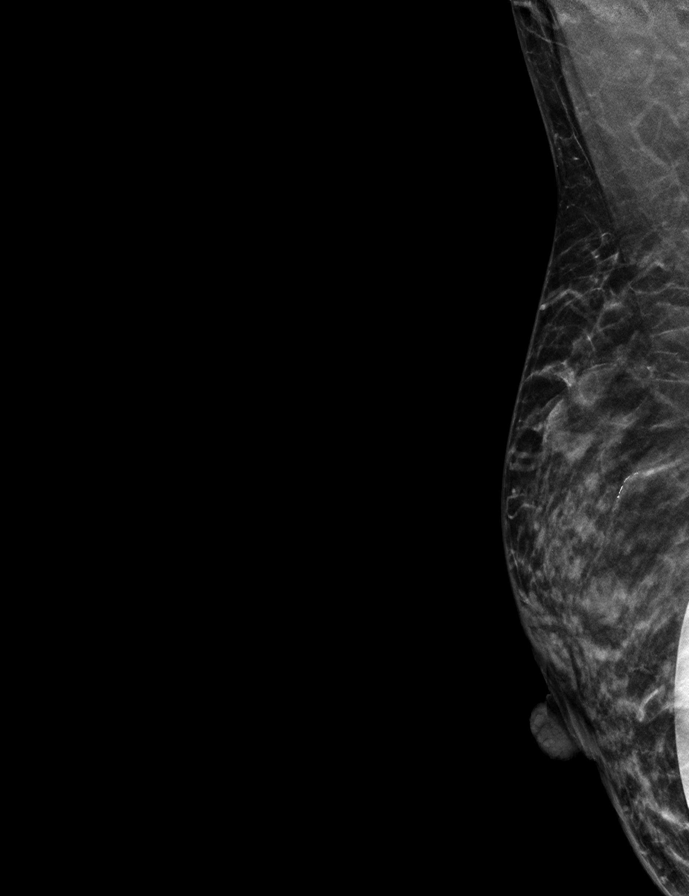

[R MLO (2 of 2)]
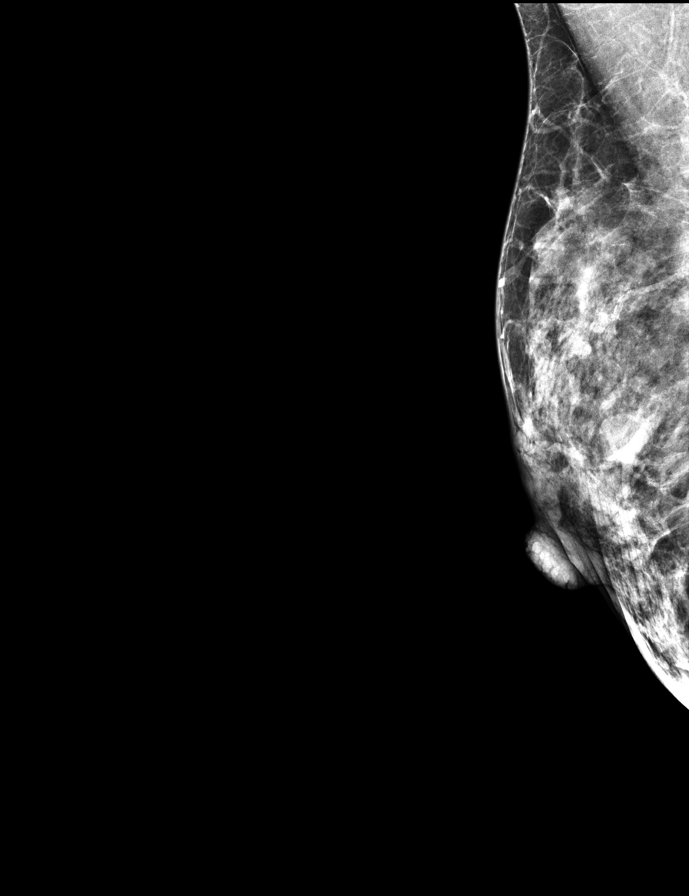

[L MLO synth-2D]
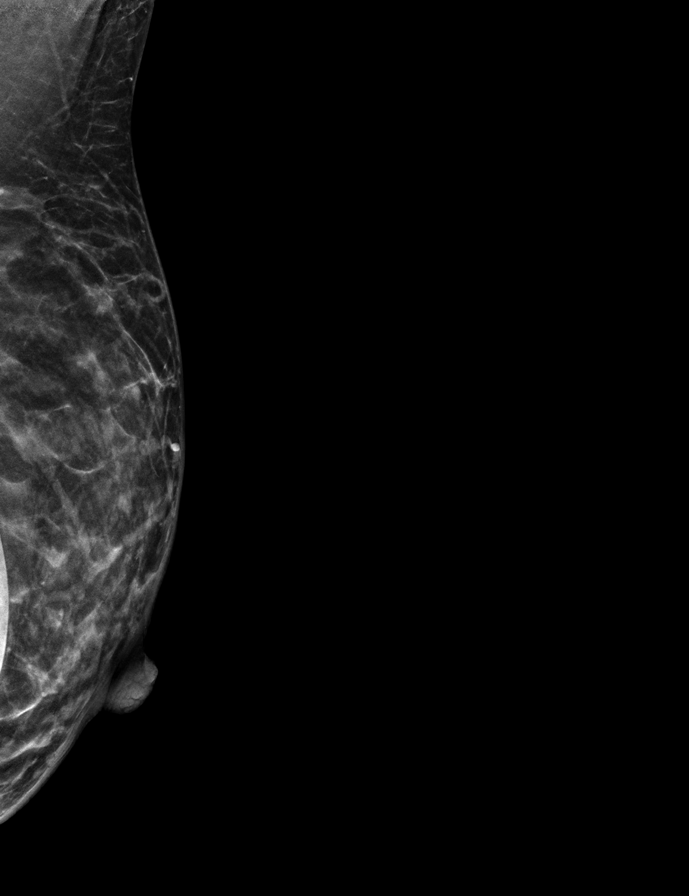

[9 of 39 positions shown; findings below may reference images not displayed]

ACR Breast Density Category c: The breast tissue is heterogeneously
dense, which may obscure small masses.
FINDINGS: There are no findings suspicious for malignancy. Images were
processed with CAD.
IMPRESSION: No mammographic evidence of malignancy. A result letter of this
screening mammogram will be mailed directly to the patient.

RECOMMENDATION:
Screening mammogram in one year. (Code:49-X-OQ9)

BI-RADS CATEGORY  1:  Negative.

## 2021-05-31 ENCOUNTER — Ambulatory Visit: Payer: Managed Care, Other (non HMO) | Admitting: Family Medicine

## 2021-07-07 NOTE — Progress Notes (Signed)
Name: Nicole Roman   MRN: [id number]    DOB: 28-Aug-1975   Date:07/09/2021       Progress Note  Subjective  Chief Complaint  Follow Up  HPI  Skin eruptions: going on since 2020, evaluated by Dermatologist, tried multiple medications she was advised by Dermatologist - Dr. Phillip Heal to stop all oral supplementation and re-introduce it slowly to see if it was the cause of her symptoms, still has dark bumps and skin irritation, no new lesions.   Goiter : she had a biopsy in the past around 2013, last US done in 2019 showed a nodule but did not require a biopsy, last labs was normal   B12 deficiency: it was down to 227, she is willing to resume injections. She has been taking otc B12 gummies. She denies fatigue, no tingling or numbness.   Anemia iron deficiency: at one point had to get blood transfusion, she eats plenty of vegetable, no red meat, but eats chicken and/or  fish daily but has a family history of colon cancer and also family history of anemia. She had a normal colonoscopy in 2021  Recurrent vaginitis: she has a constant white discharge, no pain or itching, reassurance given    Ear pain: usually left side, accumulates a lot of wax and improves with ear lavage   Patient Active Problem List   Diagnosis Date Noted   Fibroid uterus 01/29/2021   Malpositioned IUD 01/29/2021   Family history of colon cancer in mother    Polyp of colon    IUD (intrauterine device) in place 11/08/2019   B12 deficiency 08/14/2018   Ovarian cyst, right 02/09/2016   Goiter 02/08/2016    Past Surgical History:  Procedure Laterality Date   AUGMENTATION MAMMAPLASTY Bilateral 2019   breast inplant Bilateral 07/30/2017   Dr. Aretha Parrot with [facility] in [place] N/A 01/15/2020   Procedure: COLONOSCOPY WITH PROPOFOL;  Surgeon: Virgel Manifold, MD;  Location: [facility] ENDOSCOPY;  Service: Gastroenterology;  Laterality: N/A;   LEEP  2002   tummy tuck  07/30/2017   Dr.  Aretha Parrot with [facility] in [place]    Family History  Problem Relation Age of Onset   Cancer Mother        colon   Hypertension Mother    Diabetes Father    Breast cancer Neg Hx     Social History   Tobacco Use   Smoking status: Never   Smokeless tobacco: Never  Substance Use Topics   Alcohol use: Yes    Comment: occasionally     Current Outpatient Medications:    levonorgestrel (MIRENA) 20 MCG/24HR IUD, 1 each by Intrauterine route once., Disp: , Rfl:   Current Facility-Administered Medications:    cyanocobalamin ((VITAMIN B-12)) injection 1,000 mcg, 1,000 mcg, Intramuscular, Once, Steele Sizer, MD  Allergies  Allergen Reactions   Diflucan [Fluconazole] Rash   Tetanus Toxoids Rash    I personally reviewed active problem list, medication list, allergies, family history, social history, health maintenance with the patient/caregiver today.   ROS  Ten systems reviewed and is negative except as mentioned in HPI   Objective  Vitals:   07/09/21 1128  BP: 138/86  Pulse: 96  Resp: 16  Temp: 98.2 F (36.8 C)  TempSrc: Oral  SpO2: 100%  Weight: 158 lb (71.7 kg)  Height: 5\' 4"  (1.626 m)    Body mass index is 27.12 kg/m.  Physical Exam  Constitutional: Patient appears well-developed and well-nourished.  No  distress.  HEENT: head atraumatic, normocephalic, pupils equal and reactive to light, ears cerumen impaction bilaterally , neck supple Cardiovascular: Normal rate, regular rhythm and normal heart sounds.  No murmur heard. No BLE edema. Pulmonary/Chest: Effort normal and breath sounds normal. No respiratory distress. Abdominal: Soft.  There is no tenderness. Psychiatric: Patient has a normal mood and affect. behavior is normal. Judgment and thought content normal.    PHQ2/9: Depression screen [facility] 2/9 07/09/2021 02/05/2021 11/27/2020 10/27/2020 08/24/2020  Decreased Interest 0 0 2 0 0  Down, Depressed, Hopeless 0 0 0 0 0  PHQ - 2 Score 0 0 2 0 0   Altered sleeping - - 0 - 0  Tired, decreased energy - - 0 - 0  Change in appetite - - 0 - 0  Feeling bad or failure about yourself  - - 0 - 0  Trouble concentrating - - 0 - 0  Moving slowly or fidgety/restless - - 0 - 0  Suicidal thoughts - - 0 - 0  PHQ-9 Score - - 2 - 0  Difficult doing work/chores - - Not difficult at all - Not difficult at all  Some recent data might be hidden    phq 9 is negative   Fall Risk: Fall Risk  07/09/2021 02/05/2021 12/25/2020 11/27/2020 10/27/2020  Falls in the past year? 0 0 0 0 0  Number falls in past yr: - 0 0 0 0  Injury with Fall? - 0 0 0 0  Follow up Falls prevention discussed - - Falls evaluation completed Falls evaluation completed      Functional Status Survey: Is the patient deaf or have difficulty hearing?: No Does the patient have difficulty seeing, even when wearing glasses/contacts?: No Does the patient have difficulty concentrating, remembering, or making decisions?: No Does the patient have difficulty walking or climbing stairs?: No Does the patient have difficulty dressing or bathing?: No Does the patient have difficulty doing errands alone such as visiting a doctor's office or shopping?: No    Assessment & Plan  1. B12 deficiency  - cyanocobalamin ((VITAMIN B-12)) injection 1,000 mcg  2. Iron deficiency anemia, unspecified iron deficiency anemia type  Advised to resume supplementation in about 2 months since she had allergic reaction to supplements . We will resume B12 injections today   3. Goiter   4. Drug rash  Seen by Dermatologist    5. Bilateral hearing loss due to cerumen impaction  - Ear Lavage  Verbal consent given Possible side effects discussed with patient Ears were  lavaged with warm water and peroxide  Patient tolerated procedure well No complications

## 2021-07-09 ENCOUNTER — Encounter: Payer: Self-pay | Admitting: Family Medicine

## 2021-07-09 ENCOUNTER — Ambulatory Visit: Payer: Managed Care, Other (non HMO) | Admitting: Family Medicine

## 2021-07-09 ENCOUNTER — Other Ambulatory Visit: Payer: Self-pay | Admitting: Family Medicine

## 2021-07-09 ENCOUNTER — Telehealth: Payer: Self-pay

## 2021-07-09 VITALS — BP 138/86 | HR 96 | Temp 98.2°F | Resp 16 | Ht 64.0 in | Wt 158.0 lb

## 2021-07-09 DIAGNOSIS — L270 Generalized skin eruption due to drugs and medicaments taken internally: Secondary | ICD-10-CM

## 2021-07-09 DIAGNOSIS — E538 Deficiency of other specified B group vitamins: Secondary | ICD-10-CM | POA: Diagnosis not present

## 2021-07-09 DIAGNOSIS — D509 Iron deficiency anemia, unspecified: Secondary | ICD-10-CM | POA: Diagnosis not present

## 2021-07-09 DIAGNOSIS — H6123 Impacted cerumen, bilateral: Secondary | ICD-10-CM

## 2021-07-09 DIAGNOSIS — E049 Nontoxic goiter, unspecified: Secondary | ICD-10-CM | POA: Diagnosis not present

## 2021-07-09 MED ORDER — CYANOCOBALAMIN 1000 MCG/ML IJ SOLN: 1000.0000 ug | INTRAMUSCULAR | 3 refills | Status: DC

## 2021-07-09 MED ORDER — CYANOCOBALAMIN 1000 MCG/ML IJ SOLN
1000.0000 ug | Freq: Once | INTRAMUSCULAR | Status: AC
  Administered 2021-07-09: 1000 ug via INTRAMUSCULAR

## 2021-07-09 NOTE — Telephone Encounter (Signed)
Pt wanted to remind you to send her b-12 to her pharmacy

## 2021-07-09 NOTE — Telephone Encounter (Signed)
Pt was informed.

## 2021-10-09 ENCOUNTER — Other Ambulatory Visit: Payer: Self-pay | Admitting: Family Medicine

## 2021-10-11 DIAGNOSIS — M79642 Pain in left hand: Secondary | ICD-10-CM | POA: Insufficient documentation

## 2021-11-29 NOTE — Progress Notes (Signed)
Name: Nicole Roman   MRN: [id number]    DOB: 26-Jun-1976   Date:11/30/2021 ? ?     Progress Note ? ?Subjective ? ?Chief Complaint ? ?Annual Exam  ? ?HPI ? ?Patient presents for annual CPE and follow up ? ?Iron deficiency anemia and B12 deficiency: she has an IUD and cycles not as heavy now but pica. She gets B12 shots at home monthly - sister gives it to her, but not taking iron supplementation at this time. She denies sob . Colonoscopy is up to date, she has fibroid tumors.  ? ?Diet: balanced diet  ?Exercise: continue regular physical activity   ? ?Post Office Visit from 11/30/2021 in [facility]  ?AUDIT-C Score 1  ? ?  ? ?Depression: Phq 9 is  negative ? ?  11/30/2021  ? 11:03 AM 07/09/2021  ? 11:29 AM 02/05/2021  ? 10:11 AM 11/27/2020  ? 10:15 AM 10/27/2020  ?  2:29 PM  ?Depression screen PHQ 2/9  ?Decreased Interest 0 0 0 2 0  ?Down, Depressed, Hopeless 0 0 0 0 0  ?PHQ - 2 Score 0 0 0 2 0  ?Altered sleeping 1   0   ?Tired, decreased energy 0   0   ?Change in appetite 0   0   ?Feeling bad or failure about yourself  0   0   ?Trouble concentrating 0   0   ?Moving slowly or fidgety/restless 0   0   ?Suicidal thoughts 0   0   ?PHQ-9 Score 1   2   ?Difficult doing work/chores    Not difficult at all   ? ?Hypertension: ?BP Readings from Last 3 Encounters:  ?11/30/21 126/82  ?07/09/21 138/86  ?03/10/21 138/83  ? ?Obesity: ?Wt Readings from Last 3 Encounters:  ?11/30/21 154 lb 1.6 oz (69.9 kg)  ?07/09/21 158 lb (71.7 kg)  ?03/10/21 160 lb 11.2 oz (72.9 kg)  ? ?BMI Readings from Last 3 Encounters:  ?11/30/21 27.30 kg/m?  ?07/09/21 27.12 kg/m?  ?03/10/21 27.58 kg/m?  ?  ? ?Vaccines:  ? ?HPV: not interested  ?Tdap: up to date  ?Shingrix: N/A ?Pneumonia: N/A ?Flu: refused ?COVID-19:discussed booster ? ? ?Hep C Screening: done in 2021  ?STD testing and prevention (HIV/chl/gon/syphilis): Not interested  ?Intimate partner violence: negative screen  ?Sexual History : same partner for about 6 years, no  pain during intercourse  ?Menstrual History/LMP/Abnormal Bleeding: has IUD - Mirena last one placed in 2022, still has regular cycles heavy for two days and still spots for about one week, bleeding not as intense as it used to be  ?Discussed importance of follow up if any post-menopausal bleeding: not applicable  ?Incontinence Symptoms: positive for symptoms When she has a cold/coughing Discussed Kegel exercises  ? ?Breast cancer:  ?- Last Mammogram: due for repeat this Summer  ?- BRCA gene screening: mother colon cancer, paternal uncle had colon cancer, maternal grandmother had a type of cancer but not sure what type.  She wants to think about having the genetic test  ? ?Osteoporosis Prevention : Discussed high calcium and vitamin D supplementation, weight bearing exercises ?Bone density :not applicable  ? ?Cervical cancer screening: repeat every 5 years  ? ?Skin cancer: Discussed monitoring for atypical lesions  ?Colorectal cancer: up to date    ?Lung cancer:  Low Dose CT Chest recommended if Age 10-80 years, 20 pack-year currently smoking OR have quit w/in 15years. Patient does not qualify for screen   ?ECG:  2018 ? ?Advanced Care Planning: A voluntary discussion about advance care planning including the explanation and discussion of advance directives.  Discussed health care proxy and Living will, and the patient was able to identify a health care proxy as sister .  Patient does not have a living will and power of attorney of health care  ? ?Lipids: ?Lab Results  ?Component Value Date  ? CHOL 149 11/27/2020  ? CHOL 160 09/11/2019  ? CHOL 142 08/11/2018  ? ?Lab Results  ?Component Value Date  ? HDL 52 11/27/2020  ? HDL 52 09/11/2019  ? HDL 52 08/11/2018  ? ?Lab Results  ?Component Value Date  ? [place] 90 11/27/2020  ? [place] 98 09/11/2019  ? [place] 83 08/11/2018  ? ?Lab Results  ?Component Value Date  ? TRIG 25 11/27/2020  ? TRIG 35 09/11/2019  ? TRIG 36 (A) 08/11/2018  ? ?Lab Results  ?Component Value Date  ?  CHOLHDL 2.9 11/27/2020  ? CHOLHDL 3.1 09/11/2019  ? CHOLHDL 3.4 10/04/2017  ? ?No results found for: LDLDIRECT ? ?Glucose: ?Glucose  ?Date Value Ref Range Status  ?06/30/2017 82 65 - 99 mg/dL Final  ?08/31/2016 94 65 - 99 mg/dL Final  ?05/20/2015 85 65 - 99 mg/dL Final  ? ?Glucose, Bld  ?Date Value Ref Range Status  ?11/27/2020 96 65 - 99 mg/dL Final  ?  Comment:  ?  . ?           Fasting reference interval ?. ?  ?06/19/2019 84 65 - 99 mg/dL Final  ?  Comment:  ?  . ?           Fasting reference interval ?. ?  ? ? ?Patient Active Problem List  ? Diagnosis Date Noted  ? 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 Pain of left hand 10/11/2021  ? Fibroid uterus 01/29/2021  ? Family history of colon cancer in mother   ? Polyp of colon   ? IUD (intrauterine device) in place 11/08/2019  ? B12 deficiency 08/14/2018  ? Ovarian cyst, right 02/09/2016  ? Goiter 02/08/2016  ? ? ?Past Surgical History:  ?Procedure Laterality Date  ? AUGMENTATION MAMMAPLASTY Bilateral 2019  ? breast inplant Bilateral 07/30/2017  ? Dr. Aretha Parrot with [facility] in [place]  ? COLONOSCOPY WITH PROPOFOL N/A 01/15/2020  ? Procedure: COLONOSCOPY WITH PROPOFOL;  Surgeon: Virgel Manifold, MD;  Location: [facility] ENDOSCOPY;  Service: Gastroenterology;  Laterality: N/A;  ? LEEP  2002  ? tummy tuck  07/30/2017  ? Dr. Aretha Parrot with [facility] in [place]  ? ? ?Family History  ?Problem Relation Age of Onset  ? Cancer Mother   ?     colon  ? Hypertension Mother   ? Diabetes Father   ? Breast cancer Neg Hx   ? ? ?Social History  ? ?Socioeconomic History  ? Marital status: Divorced  ?  Spouse name: Not on file  ? Number of children: 2  ? Years of education: Not on file  ? Highest education level: Some college, no degree  ?Occupational History  ? Not on file  ?Tobacco Use  ? Smoking status: Never  ? Smokeless tobacco: Never  ?Vaping Use  ? Vaping Use: Never used  ?Substance and Sexual Activity  ? Alcohol use: Yes  ?  Comment: occasionally  ? Drug use: No  ?  Sexual activity: Yes  ?  Partners: Male  ?  Birth control/protection: I.U.D.  ?Other Topics Concern  ? Not on file  ?  Social History Narrative  ? Patient is originally from [place]  ? Dating for many years  ? ?Social Determinants of Health  ? ?Financial Resource Strain: Medium Risk  ? Difficulty of Paying Living Expenses: Somewhat hard  ?Food Insecurity: No Food Insecurity  ? Worried About Charity fundraiser in the Last Year: Never true  ? Ran Out of Food in the Last Year: Never true  ?Transportation Needs: No Transportation Needs  ? Lack of Transportation (Medical): No  ? Lack of Transportation (Non-Medical): No  ?Physical Activity: Sufficiently Active  ? Days of Exercise per Week: 3 days  ? Minutes of Exercise per Session: 50 min  ?Stress: No Stress Concern Present  ? Feeling of Stress : Not at all  ?Social Connections: Socially Isolated  ? Frequency of Communication with Friends and Family: More than three times a week  ? Frequency of Social Gatherings with Friends and Family: Three times a week  ? Attends Religious Services: Never  ? Active Member of Clubs or Organizations: No  ? Attends Archivist Meetings: Never  ? Marital Status: Divorced  ?Intimate Partner Violence: Not At Risk  ? Fear of Current or Ex-Partner: No  ? Emotionally Abused: No  ? Physically Abused: No  ? Sexually Abused: No  ? ? ? ?Current Outpatient Medications:  ?  levonorgestrel (MIRENA) 20 MCG/24HR IUD, 1 each by Intrauterine route once., Disp: , Rfl:  ? ?Allergies  ?Allergen Reactions  ? Diflucan [Fluconazole] Rash  ? Tetanus Toxoids Rash  ? ? ? ?ROS ? ?Constitutional: Negative for fever or weight change.  ?Respiratory: Negative for cough and shortness of breath.   ?Cardiovascular: Negative for chest pain or palpitations.  ?Gastrointestinal: Negative for abdominal pain, no bowel changes.  ?Musculoskeletal: Negative for gait problem or joint swelling.  ?Skin: Negative for rash.  ?Neurological: Negative for dizziness or headache.   ?No other specific complaints in a complete review of systems (except as listed in HPI above).  ? ?Objective ? ?Vitals:  ? 11/30/21 1056  ?BP: 126/82  ?Pulse: 75  ?Resp: 14  ?Temp: 98.1 ?F (36.7 ?C)  ?Te

## 2021-11-30 ENCOUNTER — Ambulatory Visit (INDEPENDENT_AMBULATORY_CARE_PROVIDER_SITE_OTHER): Payer: Managed Care, Other (non HMO) | Admitting: Family Medicine

## 2021-11-30 ENCOUNTER — Encounter: Payer: Self-pay | Admitting: Family Medicine

## 2021-11-30 VITALS — BP 126/82 | HR 75 | Temp 98.1°F | Resp 14 | Ht 63.0 in | Wt 154.1 lb

## 2021-11-30 DIAGNOSIS — Z1231 Encounter for screening mammogram for malignant neoplasm of breast: Secondary | ICD-10-CM

## 2021-11-30 DIAGNOSIS — Z124 Encounter for screening for malignant neoplasm of cervix: Secondary | ICD-10-CM

## 2021-11-30 DIAGNOSIS — Z0000 Encounter for general adult medical examination without abnormal findings: Secondary | ICD-10-CM

## 2021-11-30 DIAGNOSIS — E538 Deficiency of other specified B group vitamins: Secondary | ICD-10-CM | POA: Diagnosis not present

## 2021-11-30 DIAGNOSIS — D500 Iron deficiency anemia secondary to blood loss (chronic): Secondary | ICD-10-CM

## 2021-11-30 DIAGNOSIS — L271 Localized skin eruption due to drugs and medicaments taken internally: Secondary | ICD-10-CM | POA: Diagnosis not present

## 2021-11-30 NOTE — Assessment & Plan Note (Signed)
Under the care of [facility] ?

## 2021-12-01 ENCOUNTER — Other Ambulatory Visit: Payer: Self-pay | Admitting: Family Medicine

## 2021-12-01 DIAGNOSIS — D500 Iron deficiency anemia secondary to blood loss (chronic): Secondary | ICD-10-CM

## 2021-12-01 LAB — CBC WITH DIFFERENTIAL/PLATELET
Absolute Monocytes: 345 cells/uL (ref 200–950)
Basophils Absolute: 18 cells/uL (ref 0–200)
Basophils Relative: 0.4 %
Eosinophils Absolute: 32 cells/uL (ref 15–500)
Eosinophils Relative: 0.7 %
HCT: 36 % (ref 35.0–45.0)
Hemoglobin: 11.5 g/dL — ABNORMAL LOW (ref 11.7–15.5)
Lymphs Abs: 2263 cells/uL (ref 850–3900)
MCH: 27.6 pg (ref 27.0–33.0)
MCHC: 31.9 g/dL — ABNORMAL LOW (ref 32.0–36.0)
MCV: 86.3 fL (ref 80.0–100.0)
MPV: 11.7 fL (ref 7.5–12.5)
Monocytes Relative: 7.5 %
Neutro Abs: 1941 cells/uL (ref 1500–7800)
Neutrophils Relative %: 42.2 %
Platelets: 242 10*3/uL (ref 140–400)
RBC: 4.17 10*6/uL (ref 3.80–5.10)
RDW: 12.8 % (ref 11.0–15.0)
Total Lymphocyte: 49.2 %
WBC: 4.6 10*3/uL (ref 3.8–10.8)

## 2021-12-01 LAB — IRON,TIBC AND FERRITIN PANEL
%SAT: 14 % (calc) — ABNORMAL LOW (ref 16–45)
Ferritin: 4 ng/mL — ABNORMAL LOW (ref 16–232)
Iron: 64 ug/dL (ref 40–190)
TIBC: 456 mcg/dL (calc) — ABNORMAL HIGH (ref 250–450)

## 2021-12-01 LAB — B12 AND FOLATE PANEL
Folate: 19.3 ng/mL
Vitamin B-12: 534 pg/mL (ref 200–1100)

## 2021-12-01 MED ORDER — IRON (FERROUS SULFATE) 325 (65 FE) MG PO TABS: 325.0000 mg | ORAL_TABLET | Freq: Every day | ORAL | 0 refills | Status: DC

## 2022-01-20 ENCOUNTER — Ambulatory Visit
Admission: RE | Admit: 2022-01-20 | Discharge: 2022-01-20 | Disposition: A | Discharge status: Home / Self Care | Payer: Managed Care, Other (non HMO) | Source: Ambulatory Visit | Attending: Family Medicine | Admitting: Family Medicine

## 2022-01-20 ENCOUNTER — Other Ambulatory Visit: Payer: Self-pay | Admitting: Family Medicine

## 2022-01-20 DIAGNOSIS — Z1231 Encounter for screening mammogram for malignant neoplasm of breast: Secondary | ICD-10-CM | POA: Diagnosis present

## 2022-01-20 DIAGNOSIS — N63 Unspecified lump in unspecified breast: Secondary | ICD-10-CM

## 2022-01-20 DIAGNOSIS — R928 Other abnormal and inconclusive findings on diagnostic imaging of breast: Secondary | ICD-10-CM

## 2022-01-21 ENCOUNTER — Other Ambulatory Visit: Payer: Self-pay

## 2022-01-21 DIAGNOSIS — R928 Other abnormal and inconclusive findings on diagnostic imaging of breast: Secondary | ICD-10-CM

## 2022-01-21 NOTE — Progress Notes (Signed)
Added views  

## 2022-02-05 ENCOUNTER — Other Ambulatory Visit: Payer: Self-pay | Admitting: Family Medicine

## 2022-02-15 ENCOUNTER — Other Ambulatory Visit: Payer: Managed Care, Other (non HMO)

## 2022-02-16 ENCOUNTER — Ambulatory Visit
Admission: RE | Admit: 2022-02-16 | Discharge: 2022-02-16 | Disposition: A | Discharge status: Home / Self Care | Payer: Managed Care, Other (non HMO) | Source: Ambulatory Visit | Attending: Family Medicine | Admitting: Family Medicine

## 2022-02-16 DIAGNOSIS — N63 Unspecified lump in unspecified breast: Secondary | ICD-10-CM | POA: Diagnosis present

## 2022-02-16 DIAGNOSIS — R928 Other abnormal and inconclusive findings on diagnostic imaging of breast: Secondary | ICD-10-CM

## 2022-04-12 ENCOUNTER — Encounter: Payer: Self-pay | Admitting: Obstetrics and Gynecology

## 2022-04-12 ENCOUNTER — Ambulatory Visit: Payer: Managed Care, Other (non HMO) | Admitting: Obstetrics and Gynecology

## 2022-04-12 ENCOUNTER — Other Ambulatory Visit (HOSPITAL_COMMUNITY)
Admission: RE | Admit: 2022-04-12 | Discharge: 2022-04-12 | Disposition: A | Discharge status: Home / Self Care | Payer: Managed Care, Other (non HMO) | Source: Ambulatory Visit | Attending: Obstetrics and Gynecology | Admitting: Obstetrics and Gynecology

## 2022-04-12 VITALS — BP 144/86 | HR 70 | Resp 16 | Ht 64.0 in | Wt 157.2 lb

## 2022-04-12 DIAGNOSIS — Z124 Encounter for screening for malignant neoplasm of cervix: Secondary | ICD-10-CM | POA: Insufficient documentation

## 2022-04-12 DIAGNOSIS — Z975 Presence of (intrauterine) contraceptive device: Secondary | ICD-10-CM | POA: Diagnosis not present

## 2022-04-12 DIAGNOSIS — N921 Excessive and frequent menstruation with irregular cycle: Secondary | ICD-10-CM

## 2022-04-12 MED ORDER — ESTRADIOL 1 MG PO TABS: 1.0000 mg | ORAL_TABLET | Freq: Every day | ORAL | 1 refills | Status: DC

## 2022-04-12 NOTE — Progress Notes (Signed)
    GYNECOLOGY PROGRESS NOTE  Subjective:    Patient ID: Nicole Roman, female    DOB: [birth year], 46 y.o.   MRN: [id number]  HPI  Patient is a 46 y.o. [id number] female who presents for irregular bleeding with IUD. Has Mirena IUD in place, inserted 01/2021. She has been bleeding, clotting and cramping x 2 weeks. She wants to make sure it is not related to her IUD. She has been unable to feel the strings. She wants to make sure IUD is in the right position. Of note, patient had similar issue around this time last year, had to have her last IUD removed and replaced due to malpositioning.   Notes she also thinks she is due for her pap smear. Would like to have done today if possible.    Period Cycle (Days): 28 Period Duration (Days): 4 Period Pattern: Regular Menstrual Flow: Heavy, Light Menstrual Control: Tampon Menstrual Control Change Freq (Hours): 2 Dysmenorrhea: (!) Mild Dysmenorrhea Symptoms: Cramping, Headache    The following portions of the patient's history were reviewed and updated as appropriate: allergies, current medications, past family history, past medical history, past social history, past surgical history, and problem list.  Review of Systems Pertinent items noted in HPI and remainder of comprehensive ROS otherwise negative.   Objective:   Blood pressure (!) 144/86, pulse 70, resp. rate 16, height '5\' 4"'$  (1.626 m), weight 157 lb 3.2 oz (71.3 kg). Body mass index is 26.98 kg/m. General appearance: alert, cooperative, and no distress Abdomen: soft, non-tender; bowel sounds normal; no masses,  no organomegaly Pelvic: external genitalia normal, rectovaginal septum normal.  Vagina with scant brown discharge.  Cervix normal appearing, no lesions and no motion tenderness.  IUD threads not immediately visible, attempted retrieval with cytobrush and tonsil forceps, able to retrieve up to 1 cm in length. Uterus mobile, nontender, normal shape and size.  Adnexae non-palpable,  nontender bilaterally.  Extremities: extremities normal, atraumatic, no cyanosis or edema Neurologic: Grossly normal   Assessment:   1. Breakthrough bleeding associated with intrauterine device (IUD)   2. Cervical cancer screening      Plan:   Breakthrough bleeding with IUD, discussed NSAIDs and supplemental estrogen, patient ok to do estrogen if bleeding becomes heavy again. Currently only spotting noted today after IUD thread retrieval.   Pap smear performed, last pap was 07/2018.  Usually sees PCP for annual exams.  Return if symptoms worsen or fail to improve.    Rubie Maid, MD [facility]

## 2022-04-19 ENCOUNTER — Encounter: Payer: Self-pay | Admitting: Obstetrics and Gynecology

## 2022-04-19 DIAGNOSIS — N898 Other specified noninflammatory disorders of vagina: Secondary | ICD-10-CM

## 2022-04-19 LAB — CYTOLOGY - PAP
Comment: NEGATIVE
Diagnosis: NEGATIVE
High risk HPV: NEGATIVE

## 2022-04-20 ENCOUNTER — Telehealth: Payer: Self-pay | Admitting: Obstetrics and Gynecology

## 2022-04-20 ENCOUNTER — Emergency Department (HOSPITAL_COMMUNITY): Payer: Managed Care, Other (non HMO)

## 2022-04-20 ENCOUNTER — Emergency Department (HOSPITAL_COMMUNITY)
Admission: EM | Admit: 2022-04-20 | Discharge: 2022-04-21 | Discharge status: Against Medical Advice | Payer: Managed Care, Other (non HMO) | Attending: Physician Assistant | Admitting: Physician Assistant

## 2022-04-20 DIAGNOSIS — R5383 Other fatigue: Secondary | ICD-10-CM | POA: Insufficient documentation

## 2022-04-20 DIAGNOSIS — R42 Dizziness and giddiness: Secondary | ICD-10-CM | POA: Insufficient documentation

## 2022-04-20 DIAGNOSIS — R531 Weakness: Secondary | ICD-10-CM | POA: Diagnosis not present

## 2022-04-20 DIAGNOSIS — Z5321 Procedure and treatment not carried out due to patient leaving prior to being seen by health care provider: Secondary | ICD-10-CM | POA: Insufficient documentation

## 2022-04-20 DIAGNOSIS — N939 Abnormal uterine and vaginal bleeding, unspecified: Secondary | ICD-10-CM | POA: Diagnosis present

## 2022-04-20 DIAGNOSIS — R1030 Lower abdominal pain, unspecified: Secondary | ICD-10-CM | POA: Diagnosis not present

## 2022-04-20 DIAGNOSIS — D259 Leiomyoma of uterus, unspecified: Secondary | ICD-10-CM | POA: Insufficient documentation

## 2022-04-20 DIAGNOSIS — N938 Other specified abnormal uterine and vaginal bleeding: Secondary | ICD-10-CM | POA: Insufficient documentation

## 2022-04-20 LAB — I-STAT BETA HCG BLOOD, ED (MC, WL, AP ONLY): I-stat hCG, quantitative: <5 m[IU]/mL (ref <5)

## 2022-04-20 LAB — CBC WITH DIFFERENTIAL/PLATELET
Abs Immature Granulocytes: 0.02 10*3/uL (ref 0.00–0.07)
Basophils Absolute: 0 10*3/uL (ref 0.0–0.1)
Basophils Relative: 0 %
Eosinophils Absolute: 0.1 10*3/uL (ref 0.0–0.5)
Eosinophils Relative: 1 %
HCT: 29.8 % — ABNORMAL LOW (ref 36.0–46.0)
Hemoglobin: 9.1 g/dL — ABNORMAL LOW (ref 12.0–15.0)
Immature Granulocytes: 0 %
Lymphocytes Relative: 24 %
Lymphs Abs: 1.7 10*3/uL (ref 0.7–4.0)
MCH: 27.8 pg (ref 26.0–34.0)
MCHC: 30.5 g/dL (ref 30.0–36.0)
MCV: 91.1 fL (ref 80.0–100.0)
Monocytes Absolute: 0.4 10*3/uL (ref 0.1–1.0)
Monocytes Relative: 6 %
Neutro Abs: 4.8 10*3/uL (ref 1.7–7.7)
Neutrophils Relative %: 69 %
Platelets: 192 10*3/uL (ref 150–400)
RBC: 3.27 MIL/uL — ABNORMAL LOW (ref 3.87–5.11)
RDW: 13.3 % (ref 11.5–15.5)
WBC: 7 10*3/uL (ref 4.0–10.5)
nRBC: 0 % (ref 0.0–0.2)

## 2022-04-20 LAB — BASIC METABOLIC PANEL
Anion gap: 5 (ref 5–15)
BUN: 11 mg/dL (ref 6–20)
CO2: 24 mmol/L (ref 22–32)
Calcium: 9.1 mg/dL (ref 8.9–10.3)
Chloride: 108 mmol/L (ref 98–111)
Creatinine, Ser: 0.58 mg/dL (ref 0.44–1.00)
GFR, Estimated: >60 mL/min (ref >60)
Glucose, Bld: 98 mg/dL (ref 70–99)
Potassium: 4.5 mmol/L (ref 3.5–5.1)
Sodium: 137 mmol/L (ref 135–145)

## 2022-04-20 LAB — TYPE AND SCREEN
ABO/RH(D): O POS
Antibody Screen: NEGATIVE

## 2022-04-20 NOTE — Telephone Encounter (Signed)
Patient called to find out what she needs  to do. Patient reports heavy bleeding with Mirena soaking thru and having to change more then  one in hours. Per nurse advise Littie Deeds CNM patient need to report to ER go to the er if filling up a pad every hour. I advise patient to report to ER per CNM request thru secure chat

## 2022-04-20 NOTE — ED Provider Triage Note (Signed)
Emergency Medicine Provider Triage Evaluation Note  Nicole Roman , Roman 46 y.o. female  was evaluated in triage.  Pt complains of vaginal bleeding.  Began 2 days ago.  She has Mirena IUD.  History of dysfunctional uterine bleeding.  OB/GYN told her to come here.  She has had 2 days of bleeding having to change pad/tampon every 20 to 30 minutes.  She is having large clots.  She feels lightheaded and fatigued.  Has needed prior blood transfusion for anemia previously.  Has some diffuse lower abdominal cramping. Feels fatigued and gen weakness  Review of Systems  Positive: Vag bleeding Negative: Fever, dysuria  Physical Exam  BP (!) 146/75 (BP Location: Right Arm)   Pulse 85   Temp 98.5 F (36.9 C) (Oral)   Resp 17   LMP 03/31/2022 (Exact Date)   SpO2 99%  Gen:   Awake, no distress   Resp:  Normal effort  MSK:   Moves extremities without difficulty  Other:    Medical Decision Making  Medically screening exam initiated at 4:29 PM.  Appropriate orders placed.  Nicole Roman was informed that the remainder of the evaluation will be completed by another provider, this initial triage assessment does not replace that evaluation, and the importance of remaining in the ED until their evaluation is complete.  Vag bleeding, fatigue   Nicole Dubie A, PA-C 04/20/22 1630

## 2022-04-20 NOTE — ED Notes (Addendum)
Pt's husband approached this Probation officer and stated that pt has decided to stay. This Probation officer took pt from [employer] and put her back in [place]

## 2022-04-20 NOTE — ED Notes (Signed)
Pt states that they are leaving due to long wait times.

## 2022-04-20 NOTE — ED Triage Notes (Addendum)
Patient complains of vaginal bleeding that started approximately two days ago. Reports changing pads every twenty minutes. Patient reports history of anemia and reports feeling fatigued. BP 146/75, HR 85, patient is alert, oriented, ambulating independently with steady gait and is in no apparent distress at this time.

## 2022-04-21 ENCOUNTER — Encounter (HOSPITAL_BASED_OUTPATIENT_CLINIC_OR_DEPARTMENT_OTHER): Payer: Self-pay | Admitting: Emergency Medicine

## 2022-04-21 ENCOUNTER — Other Ambulatory Visit: Payer: Self-pay

## 2022-04-21 ENCOUNTER — Emergency Department (HOSPITAL_BASED_OUTPATIENT_CLINIC_OR_DEPARTMENT_OTHER)
Admission: EM | Admit: 2022-04-21 | Discharge: 2022-04-21 | Disposition: A | Discharge status: Home / Self Care | Payer: Managed Care, Other (non HMO) | Source: Home / Self Care | Attending: Emergency Medicine | Admitting: Emergency Medicine

## 2022-04-21 DIAGNOSIS — D259 Leiomyoma of uterus, unspecified: Secondary | ICD-10-CM | POA: Insufficient documentation

## 2022-04-21 DIAGNOSIS — N938 Other specified abnormal uterine and vaginal bleeding: Secondary | ICD-10-CM | POA: Insufficient documentation

## 2022-04-21 LAB — CBC
HCT: 28.4 % — ABNORMAL LOW (ref 36.0–46.0)
Hemoglobin: 9 g/dL — ABNORMAL LOW (ref 12.0–15.0)
MCH: 28.2 pg (ref 26.0–34.0)
MCHC: 31.7 g/dL (ref 30.0–36.0)
MCV: 89 fL (ref 80.0–100.0)
Platelets: 185 10*3/uL (ref 150–400)
RBC: 3.19 MIL/uL — ABNORMAL LOW (ref 3.87–5.11)
RDW: 13.4 % (ref 11.5–15.5)
WBC: 7.7 10*3/uL (ref 4.0–10.5)
nRBC: 0 % (ref 0.0–0.2)

## 2022-04-21 NOTE — ED Triage Notes (Signed)
Vaginal bleeding x 2 days. BRB with clots. Had IUD placed 5 months ago. LWBS from [place]. Already had labs and [place].

## 2022-04-21 NOTE — ED Provider Notes (Signed)
[facility] Provider Note: Georgena Spurling, MD, FACEP  CSN: [id number] MRN: [id number] ARRIVAL: 04/21/22 at [place]: [place]  Vaginal Bleeding   HISTORY OF PRESENT ILLNESS  04/21/22 2:41 AM Nicole Roman is a 46 y.o. female with 2 days of vaginal bleeding.  The bleeding has been heavy and she has had passage of clots.  She had an IUD placed 5 months ago.  She had cytidine to [facility], ED earlier but left without being seen.  An ultrasound done at that facility showed multiple uterine fibroids.  He is having uterine cramping with this bleeding but denies pain at the present time.  She was given a prescription for some unspecified hormone treatment by her OB/GYN but states this made the bleeding worse.   Past Medical History:  Diagnosis Date   Breast lump    Iron deficiency anemia    Vaginal Pap smear, abnormal     Past Surgical History:  Procedure Laterality Date   AUGMENTATION MAMMAPLASTY Bilateral 2019   breast inplant Bilateral 07/30/2017   Dr. Aretha Parrot with [facility] in [place] N/A 01/15/2020   Procedure: COLONOSCOPY WITH PROPOFOL;  Surgeon: Virgel Manifold, MD;  Location: [facility] ENDOSCOPY;  Service: Gastroenterology;  Laterality: N/A;   LEEP  2002   tummy tuck  07/30/2017   Dr. Aretha Parrot with [facility] in [place]    Family History  Problem Relation Age of Onset   Cancer Mother        colon   Hypertension Mother    Diabetes Father    Breast cancer Neg Hx     Social History   Tobacco Use   Smoking status: Never   Smokeless tobacco: Never  Vaping Use   Vaping Use: Never used  Substance Use Topics   Alcohol use: Yes    Comment: occasionally   Drug use: No    Prior to Admission medications   Medication Sig Start Date End Date Taking? Authorizing Provider  cyanocobalamin (,VITAMIN B-12,) 1000 MCG/ML injection INJECT 1 ML (1,000 MCG) INTRAMUSCULARLY EVERY 30 DAYS 02/07/22   Ancil Boozer,  Drue Stager, MD  estradiol (ESTRACE) 1 MG tablet Take 1 tablet (1 mg total) by mouth daily for 14 days. Take for breakthrough bleeding. 04/12/22 04/26/22  Rubie Maid, MD  levonorgestrel (MIRENA) 20 MCG/24HR IUD 1 each by Intrauterine route once.    Shambley, Melody N, CNM    Allergies Diflucan [fluconazole] and Tetanus toxoids   REVIEW OF SYSTEMS  Negative except as noted here or in the History of Present Illness.   PHYSICAL EXAMINATION  Initial Vital Signs Blood pressure (!) 158/75, pulse 78, temperature 98.2 F (36.8 C), temperature source Oral, resp. rate 20, height '5\' 4"'$  (1.626 m), weight 71.2 kg, last menstrual period 03/31/2022, SpO2 100 %.  Examination General: Well-developed, well-nourished female in no acute distress; appearance consistent with age of record HENT: normocephalic; atraumatic Eyes: Normal appearance Neck: supple Heart: regular rate and rhythm Lungs: clear to auscultation bilaterally Abdomen: soft; nondistended; mild suprapubic tenderness; bowel sounds present Extremities: No deformity; full range of motion; pulses normal Neurologic: Awake, alert and oriented; motor function intact in all extremities and symmetric; no facial droop Skin: Warm and dry Psychiatric: Normal mood and affect   RESULTS  Summary of this visit's results, reviewed and interpreted by myself:   EKG Interpretation  Date/Time:    Ventricular Rate:    PR Interval:    QRS Duration:   QT  Interval:    QTC Calculation:   R Axis:     Text Interpretation:         Laboratory Studies: Results for orders placed or performed during the hospital encounter of 04/21/22 (from the past 24 hour(s))  CBC     Status: Abnormal   Collection Time: 04/21/22 12:52 AM  Result Value Ref Range   WBC 7.7 4.0 - 10.5 K/uL   RBC 3.19 (L) 3.87 - 5.11 MIL/uL   Hemoglobin 9.0 (L) 12.0 - 15.0 g/dL   HCT 28.4 (L) 36.0 - 46.0 %   MCV 89.0 80.0 - 100.0 fL   MCH 28.2 26.0 - 34.0 pg   MCHC 31.7 30.0 - 36.0  g/dL   RDW 13.4 11.5 - 15.5 %   Platelets 185 150 - 400 K/uL   nRBC 0.0 0.0 - 0.2 %   Imaging Studies: US PELVIC COMPLETE W TRANSVAGINAL AND TORSION R/O  Result Date: 04/20/2022 CLINICAL DATA:  Heavy vaginal bleeding. EXAM: TRANSABDOMINAL AND TRANSVAGINAL ULTRASOUND OF PELVIS DOPPLER ULTRASOUND OF OVARIES TECHNIQUE: Both transabdominal and transvaginal ultrasound examinations of the pelvis were performed. Transabdominal technique was performed for global imaging of the pelvis including uterus, ovaries, adnexal regions, and pelvic cul-de-sac. It was necessary to proceed with endovaginal exam following the transabdominal exam to visualize the endometrium and bilateral ovaries. Color and duplex Doppler ultrasound was utilized to evaluate blood flow to the ovaries. COMPARISON:  None Available. FINDINGS: Uterus Measurements: 10.8 cm x 6.4 cm x 7.0 cm = volume: 251.21 mL. A 3.4 cm x 2.9 cm x 3.6 cm anterior uterine fibroid is noted. Additional 3.1 cm x 2.4 cm x 2.6 cm and 3.0 cm x 2.7 cm x 2.6 cm posterior uterine fibroids are noted on the right. Endometrium Thickness: 6.6 mm.  No focal abnormality visualized. Right ovary Measurements: 3.1 cm x 1.6 cm x 2.6 cm = volume: 6.6 mL. Normal appearance/no adnexal mass. Left ovary Measurements: 2.7 cm x 1.0 cm x 2.6 cm = volume: 3.7 mL. Normal appearance/no adnexal mass. Pulsed Doppler evaluation of both ovaries demonstrates normal low-resistance arterial and venous waveforms. Other findings No abnormal free fluid. IMPRESSION: Multiple heterogeneous uterine fibroids. Electronically Signed   By: Virgina Norfolk M.D.   On: 04/20/2022 17:50    ED COURSE and MDM  Nursing notes, initial and subsequent vitals signs, including pulse oximetry, reviewed and interpreted by myself.  Vitals:   04/21/22 0045 04/21/22 0047  BP:  (!) 158/75  Pulse:  78  Resp:  20  Temp:  98.2 F (36.8 C)  TempSrc:  Oral  SpO2:  100%  Weight: 71.2 kg   Height: '5\' 4"'$  (1.626 m)     Medications - No data to display  Patient's bleeding is likely due to the multiple uterine fibroids.  Since she has already been prescribed hormonal therapy I do not wish to prescribe any additional hormones.  Given Tylenol for the cramping but she was advised that ibuprofen or Aleve may work better as they are NSAIDs.  She was advised of the ultrasound findings and the need to follow-up with her OB/GYN.  She may need a hysterectomy if hormonal treatment is insufficient.  She was advised that her hemoglobin is dropped from 11 to 9, which is not low enough for a blood transfusion.   PROCEDURES  Procedures   ED DIAGNOSES     ICD-10-CM   1. Dysfunctional uterine bleeding  N93.8     2. Uterine leiomyoma, unspecified location  D25.9  Shina Wass, Jenny Reichmann, MD 04/21/22 [phone]

## 2022-04-21 NOTE — ED Notes (Signed)
Pt left due to wait times

## 2022-04-22 ENCOUNTER — Encounter: Payer: Self-pay | Admitting: Obstetrics and Gynecology

## 2022-04-22 ENCOUNTER — Ambulatory Visit: Payer: Managed Care, Other (non HMO) | Admitting: Obstetrics and Gynecology

## 2022-04-22 ENCOUNTER — Other Ambulatory Visit (HOSPITAL_COMMUNITY)
Admission: RE | Admit: 2022-04-22 | Discharge: 2022-04-22 | Disposition: A | Discharge status: Home / Self Care | Payer: Managed Care, Other (non HMO) | Source: Ambulatory Visit | Attending: Obstetrics and Gynecology | Admitting: Obstetrics and Gynecology

## 2022-04-22 VITALS — BP 138/79 | HR 79 | Resp 16 | Ht 64.0 in | Wt 158.1 lb

## 2022-04-22 DIAGNOSIS — D259 Leiomyoma of uterus, unspecified: Secondary | ICD-10-CM

## 2022-04-22 DIAGNOSIS — N939 Abnormal uterine and vaginal bleeding, unspecified: Secondary | ICD-10-CM | POA: Diagnosis present

## 2022-04-22 DIAGNOSIS — T8332XD Displacement of intrauterine contraceptive device, subsequent encounter: Secondary | ICD-10-CM | POA: Diagnosis not present

## 2022-04-22 DIAGNOSIS — T8389XA Other specified complication of genitourinary prosthetic devices, implants and grafts, initial encounter: Secondary | ICD-10-CM

## 2022-04-22 MED ORDER — NORETHINDRONE ACETATE 5 MG PO TABS: 5.0000 mg | ORAL_TABLET | Freq: Every day | ORAL | 2 refills | Status: DC

## 2022-04-22 NOTE — Progress Notes (Signed)
GYNECOLOGY OFFICE PROGRESS NOTE  Subjective:    Patient ID: Nicole Roman, female    DOB: Apr 03, 1976, 46 y.o.   MRN: [id number]  HPI  Patient is a 46 y.o. [id number] female who presents for Emergency Room follow up and to discuss fibroids removal. She is also thinking that she would also like to have her IUD removed as well.  She has been experiencing breakthrough bleeding for the past several weeks, culminating in an ER visit on both 10/4 (waited in the ER for 8 hours without being seen) and 10/5 (seen at [facility] ER that following morning) due to bleeding being so heavy that she she was bleeding so heavily that she was changing a pad hourly.  At her last appointment 2 weeks ago, she was given a prescription for Estradiol to potentially help what was though to be breakthrough bleeding from her IUD.  Notes that after starting the medication, it became worse. Bleeding was associated with moderate cramping as well. Reports that in the ER she was told that her Hgb was low. Was advised to consider hysterectomy.   The following portions of the patient's history were reviewed and updated as appropriate: allergies, current medications, past family history, past medical history, past social history, past surgical history, and problem list.  Review of Systems Pertinent items noted in HPI and remainder of comprehensive ROS otherwise negative.   Objective:   Blood pressure 138/79, pulse 79, resp. rate 16, height '5\' 4"'$  (1.626 m), weight 158 lb 1.6 oz (71.7 kg), last menstrual period 03/31/2022.  Body mass index is 27.14 kg/m. General appearance: alert and no distress Abdomen: soft, non-tender; bowel sounds normal; no masses,  no organomegaly Pelvic: external genitalia normal, rectovaginal septum normal.  Vagina without discharge, small amount of dark red blood and clots in vaginal vault at cervical os.  Cervical os appears dilated, ~ 0.5 cm, IUD threads not visible again.  Cervix otherwise normal  appearing, no lesions and no motion tenderness.  Uterus mobile, nontender, normal shape and size.  Adnexae non-palpable, nontender bilaterally.    Labs:  Lab Results  Component Value Date   WBC 7.7 04/21/2022   HGB 9.0 (L) 04/21/2022   HCT 28.4 (L) 04/21/2022   MCV 89.0 04/21/2022   PLT 185 04/21/2022    Lab Results  Component Value Date   TSH 0.89 11/27/2020    Imaging:  US PELVIC COMPLETE W TRANSVAGINAL AND TORSION R/O CLINICAL DATA:  Heavy vaginal bleeding.  EXAM: TRANSABDOMINAL AND TRANSVAGINAL ULTRASOUND OF PELVIS  DOPPLER ULTRASOUND OF OVARIES  TECHNIQUE: Both transabdominal and transvaginal ultrasound examinations of the pelvis were performed. Transabdominal technique was performed for global imaging of the pelvis including uterus, ovaries, adnexal regions, and pelvic cul-de-sac.  It was necessary to proceed with endovaginal exam following the transabdominal exam to visualize the endometrium and bilateral ovaries. Color and duplex Doppler ultrasound was utilized to evaluate blood flow to the ovaries.  COMPARISON:  None Available.  FINDINGS: Uterus  Measurements: 10.8 cm x 6.4 cm x 7.0 cm = volume: 251.21 mL. A 3.4 cm x 2.9 cm x 3.6 cm anterior uterine fibroid is noted. Additional 3.1 cm x 2.4 cm x 2.6 cm and 3.0 cm x 2.7 cm x 2.6 cm posterior uterine fibroids are noted on the right.  Endometrium  Thickness: 6.6 mm.  No focal abnormality visualized.  Right ovary  Measurements: 3.1 cm x 1.6 cm x 2.6 cm = volume: 6.6 mL. Normal appearance/no adnexal mass.  Left  ovary  Measurements: 2.7 cm x 1.0 cm x 2.6 cm = volume: 3.7 mL. Normal appearance/no adnexal mass.  Pulsed Doppler evaluation of both ovaries demonstrates normal low-resistance arterial and venous waveforms.  Other findings  No abnormal free fluid.  IMPRESSION: Multiple heterogeneous uterine fibroids.  Electronically Signed   By: Virgina Norfolk M.D.   On: 04/20/2022  17:50    Assessment:   1. Abnormal uterine bleeding   2. Expulsion of intrauterine contraceptive device ([place])   3. Uterine leiomyoma, unspecified location      Plan:   Reviewed  imaging with patient, noting several small fibroids. Also no evidence of IUD noted on imaging.  Advised on concern of expulsion due to heavy bleeding (also findings of a slightly dilated cervix leads to higher suspicion).   Discussed management options for patient and husband also present. Options included medication management (changing from Estradiol to Aygestin or Myefembree), Depo Provera or Depot Lupron, or surgical intervention with UFE, uterine fibroid radiofrequency ablation, or hysterectomy. Patient and husband report that they are not interested in definitive therapy. Have been considering undergoing IVF as pregnancy is desired. Patient currently interested in radio frequency ablation. Discussed risks and benefits of the procedure.  Is ok to attempt more immediate management with Aygestin to get bleeding to stop.  Discussed that if surgical management is desired, would recommend performing endometrial biopsy (See procedure note below).  Given handout on procedure.  Patient can call to schedule surgery once bleeding stops.    Endometrial Biopsy Procedure Note  The patient is positioned on the exam table in the dorsal lithotomy position. Bimanual exam confirms uterine position and size. A Graves speculum is placed into the vagina. A single toothed tenaculum is placed onto the anterior lip of the cervix. The pipette is placed into the endocervical canal and is advanced to the uterine fundus. Using a piston like technique, with vacuum created by withdrawing the stylus, the endometrial specimen is obtained and transferred to the biopsy container. Minimal bleeding is encountered. The procedure is well tolerated.   Uterine Position: anterior    Uterine Length: 10 cm   Uterine Specimen: Scant tissue, moderate  amounts of watery blood.   Post procedure instructions are given. The patient is scheduled for follow up appointment.    A total of 45 minutes were spent during this encounter, including review of previous progress notes, recent imaging and labs, face-to-face with time with patient involving counseling and coordination of care, as well as documentation for current visit.  Rubie Maid, MD [facility]

## 2022-04-23 ENCOUNTER — Encounter: Payer: Self-pay | Admitting: Obstetrics and Gynecology

## 2022-04-26 ENCOUNTER — Telehealth: Payer: Self-pay

## 2022-04-26 LAB — SURGICAL PATHOLOGY

## 2022-04-26 NOTE — Telephone Encounter (Signed)
I have contacted the patient via [place].

## 2022-04-26 NOTE — Telephone Encounter (Signed)
Pt calling for [place] tx - incisionless procedure; has been taking the norethindrone and the bleeding has stopped but is experiencing really bad bloating; not sure if she should stop taking it.  [phone]

## 2022-05-02 MED ORDER — METRONIDAZOLE 0.75 % VA GEL: VAGINAL | 1 refills | Status: DC

## 2022-05-05 ENCOUNTER — Other Ambulatory Visit: Payer: Self-pay | Admitting: Obstetrics and Gynecology

## 2022-05-05 ENCOUNTER — Telehealth: Payer: Self-pay

## 2022-05-05 NOTE — Telephone Encounter (Signed)
I'm confused. I specifically called her pharmacy and they said she picked it up today.

## 2022-05-05 NOTE — Telephone Encounter (Signed)
Patient has picked up mediation.

## 2022-05-05 NOTE — Telephone Encounter (Signed)
Patient has been responded to via mychart. I have routed the message to Dr. Marcelline Mates.

## 2022-05-05 NOTE — Telephone Encounter (Signed)
Pt calling triage states that she is bleeding very heavily again with the fibroids and wondering if Dr Marcelline Mates can send in another refill on the medication. I told her I would send to Dr. Marcelline Mates and her CMA to advise on a refill.

## 2022-05-05 NOTE — Telephone Encounter (Signed)
Pt states she did not pick up her meds and her insurance is saying they will not cover the medication and she needs it change to  a 90 day supply.

## 2022-05-25 ENCOUNTER — Ambulatory Visit: Payer: Managed Care, Other (non HMO) | Admitting: Obstetrics and Gynecology

## 2022-05-25 ENCOUNTER — Encounter: Payer: Self-pay | Admitting: Obstetrics and Gynecology

## 2022-05-25 VITALS — BP 158/79 | HR 92 | Ht 64.0 in | Wt 155.3 lb

## 2022-05-25 DIAGNOSIS — D500 Iron deficiency anemia secondary to blood loss (chronic): Secondary | ICD-10-CM | POA: Diagnosis not present

## 2022-05-25 DIAGNOSIS — D259 Leiomyoma of uterus, unspecified: Secondary | ICD-10-CM | POA: Diagnosis not present

## 2022-05-25 DIAGNOSIS — R030 Elevated blood-pressure reading, without diagnosis of hypertension: Secondary | ICD-10-CM

## 2022-05-25 DIAGNOSIS — N939 Abnormal uterine and vaginal bleeding, unspecified: Secondary | ICD-10-CM

## 2022-05-25 DIAGNOSIS — Z01818 Encounter for other preprocedural examination: Secondary | ICD-10-CM

## 2022-05-25 NOTE — Progress Notes (Signed)
GYNECOLOGY PREOPERATIVE HISTORY AND PHYSICAL   Subjective:  Nicole Roman is a 46 y.o. [phone] here for surgical management of fibroid uterus, abnormal uterine bleeding, iron deficiency anemia due to chronic blood loss.  Patient had IUD in place for 2 years, however recent ultrasound no longer noting the presence of the IUD so concerns for expulsion are present. Has been on Aygestin to help manage bleeding until surgical intervention was scheduled, but still notes breakthrough bleeding. Patient was initially considering myomectomy as she and her partner were still desiring conception, however now notes that she desires definitive management with hysterectomy. Is no longer desiring conception. Significant preoperative concerns include iron deficiency anemia.  Proposed surgery: Laparoscopic-assisted vaginal hysterectomy with bilateral salpingectomy   Pertinent Gynecological History: Menses:  flow is irregular, lasting for between 7-14 days, occurs every 2-3 weeks Bleeding: dysfunctional uterine bleeding Contraception: none Last pap: normal Date: 04/12/2022   Past Medical History:  Diagnosis Date   Breast lump    Iron deficiency anemia    Vaginal Pap smear, abnormal     Past Surgical History:  Procedure Laterality Date   AUGMENTATION MAMMAPLASTY Bilateral 2019   breast inplant Bilateral 07/30/2017   Dr. Aretha Parrot with [facility] in [place] N/A 01/15/2020   Procedure: COLONOSCOPY WITH PROPOFOL;  Surgeon: Virgel Manifold, MD;  Location: [facility] END[facility]COPY;  Service: Gastroenterology;  Laterality: N/A;   LEEP  2002   tummy tuck  07/30/2017   Dr. Aretha Parrot with [facility] in [place]    OB History  Gravida Para Term Preterm AB Living  '3       1 2  '$ SAB IAB Ectopic Multiple Live Births  1       2    # Outcome Date GA Lbr Len/2nd Weight Sex Delivery Anes PTL Lv  3 Gravida 2004    F Vag-Spont   LIV  2 Gravida 1996    M Vag-Spont   LIV  1  SAB             Family History  Problem Relation Age of Onset   Cancer Mother        colon   Hypertension Mother    Diabetes Father    Breast cancer Neg Hx     Social History   Socioeconomic History   Marital status: Divorced    Spouse name: Not on file   Number of children: 2   Years of education: Not on file   Highest education level: Some college, no degree  Occupational History   Not on file  Tobacco Use   Smoking status: Never   Smokeless tobacco: Never  Vaping Use   Vaping Use: Never used  Substance and Sexual Activity   Alcohol use: Yes    Comment: occasionally   Drug use: No   Sexual activity: Yes    Partners: Male    Birth control/protection: I.U.D.  Other Topics Concern   Not on file  Social History Narrative   Patient is originally from [place]   Dating for many years   Social Determinants of Health   Financial Resource Strain: Medium Risk (11/30/2021)   Overall Financial Resource Strain (CARDIA)    Difficulty of Paying Living Expenses: Somewhat hard  Food Insecurity: No Food Insecurity (11/30/2021)   Hunger Vital Sign    Worried About Running Out of Food in the Last Year: Never true    Ran Out of Food in the Last Year: Never  true  Transportation Needs: No Transportation Needs (11/30/2021)   PRAPARE - Hydrologist (Medical): No    Lack of Transportation (Non-Medical): No  Physical Activity: Sufficiently Active (11/30/2021)   Exercise Vital Sign    Days of Exercise per Week: 3 days    Minutes of Exercise per Session: 50 min  Stress: No Stress Concern Present (11/30/2021)   [place]    Feeling of Stress : Not at all  Social Connections: Socially Isolated (11/30/2021)   Social Connection and Isolation Panel [NHANES]    Frequency of Communication with Friends and Family: More than three times a week    Frequency of Social Gatherings with Friends and Family:  Three times a week    Attends Religious Services: Never    Active Member of Clubs or Organizations: No    Attends Archivist Meetings: Never    Marital Status: Divorced  Human resources officer Violence: Not At Risk (11/30/2021)   Humiliation, Afraid, Rape, and Kick questionnaire    Fear of Current or Ex-Partner: No    Emotionally Abused: No    Physically Abused: No    Sexually Abused: No    Current Outpatient Medications on File Prior to Visit  Medication Sig Dispense Refill   cyanocobalamin (,VITAMIN B-12,) 1000 MCG/ML injection INJECT 1 ML (1,000 MCG) INTRAMUSCULARLY EVERY 30 DAYS 3 mL 1   meloxicam (MOBIC) 15 MG tablet Take 1 tablet by mouth daily.     metroNIDAZOLE (METROGEL) 0.75 % vaginal gel APPLY ONE APPLICATORFUL TO VAGINA AT BEDTIME FOR 10 DAYS, THEN TWICE A WEEK FOR 6 MONTHS. 70 g 1   norethindrone (AYGESTIN) 5 MG tablet Take 1 tablet (5 mg total) by mouth daily. Can increase to 2 tablets daily if bleeding does not stop after 1 week. 30 tablet 2   predniSONE (DELTASONE) 5 MG tablet See admin instructions.     No current facility-administered medications on file prior to visit.   Allergies  Allergen Reactions   Diflucan [Fluconazole] Rash   Tetanus Toxoids Rash     Review of Systems Constitutional: No recent fever/chills/sweats Respiratory: No recent cough/bronchitis Cardiovascular: No chest pain Gastrointestinal: No recent nausea/vomiting/diarrhea Genitourinary: No UTI symptoms Hematologic/lymphatic:No history of coagulopathy or recent blood thinner use    Objective:   Blood pressure (!) 158/79, pulse 92, height '5\' 4"'$  (1.626 m), weight 155 lb 4.8 oz (70.4 kg). CONSTITUTIONAL: Well-developed, well-nourished female in no acute distress.  HENT:  Normocephalic, atraumatic, External right and left ear normal. Oropharynx is clear and moist EYES: Conjunctivae and EOM are normal. Pupils are equal, round, and reactive to light. No scleral icterus.  NECK: Normal range  of motion, supple, no masses SKIN: Skin is warm and dry. No rash noted. Not diaphoretic. No erythema. No pallor. NEUROLOGIC: Alert and oriented to person, place, and time. Normal reflexes, muscle tone coordination. No cranial nerve deficit noted. PSYCHIATRIC: Normal mood and affect. Normal behavior. Normal judgment and thought content. CARDIOVASCULAR: Normal heart rate noted, regular rhythm RESPIRATORY: Effort and breath sounds normal, no problems with respiration noted ABDOMEN: Soft, nontender, nondistended. PELVIC: Deferred MUSCULOSKELETAL: Normal range of motion. No edema and no tenderness. 2+ distal pulses.    Labs: Lab Results  Component Value Date   WBC 7.7 04/21/2022   HGB 9.0 (L) 04/21/2022   HCT 28.4 (L) 04/21/2022   MCV 89.0 04/21/2022   PLT 185 04/21/2022    Lab Results  Component Value Date  TSH 0.89 11/27/2020     Imaging Studies: US PELVIC COMPLETE W TRANSVAGINAL AND TORSION R/O CLINICAL DATA:  Heavy vaginal bleeding.  EXAM: TRANSABDOMINAL AND TRANSVAGINAL ULTRASOUND OF PELVIS  DOPPLER ULTRASOUND OF OVARIES  TECHNIQUE: Both transabdominal and transvaginal ultrasound examinations of the pelvis were performed. Transabdominal technique was performed for global imaging of the pelvis including uterus, ovaries, adnexal regions, and pelvic cul-de-sac.  It was necessary to proceed with endovaginal exam following the transabdominal exam to visualize the endometrium and bilateral ovaries. Color and duplex Doppler ultrasound was utilized to evaluate blood flow to the ovaries.  COMPARISON:  None Available.  FINDINGS: Uterus  Measurements: 10.8 cm x 6.4 cm x 7.0 cm = volume: 251.21 mL. A 3.4 cm x 2.9 cm x 3.6 cm anterior uterine fibroid is noted. Additional 3.1 cm x 2.4 cm x 2.6 cm and 3.0 cm x 2.7 cm x 2.6 cm posterior uterine fibroids are noted on the right.  Endometrium  Thickness: 6.6 mm.  No focal abnormality visualized.  Right  ovary  Measurements: 3.1 cm x 1.6 cm x 2.6 cm = volume: 6.6 mL. Normal appearance/no adnexal mass.  Left ovary  Measurements: 2.7 cm x 1.0 cm x 2.6 cm = volume: 3.7 mL. Normal appearance/no adnexal mass.  Pulsed Doppler evaluation of both ovaries demonstrates normal low-resistance arterial and venous waveforms.  Other findings  No abnormal free fluid.  IMPRESSION: Multiple heterogeneous uterine fibroids.  Electronically Signed   By: Virgina Norfolk M.D.   On: 04/20/2022 17:50    Assessment:    1. Abnormal uterine bleeding   2. Uterine leiomyoma, unspecified location   3. Iron deficiency anemia due to chronic blood loss   4. Elevated blood pressure reading   5. Preoperative exam for gynecologic surgery    Plan:   Patient desires definitive management with hysterectomy.  I proposed doing a laparoscopic-assisted vaginal hysterectomy (LAVH)/total abdominal hysterectomy (TAH)/ total vaginal hysterectomy (TVH) and prophylactic bilateral salpingectomy.  No indication for oophorectomy.  Patient agrees with this proposed surgery.  The risks of surgery were discussed in detail with the patient including but not limited to: bleeding which may require transfusion or reoperation; infection which may require antibiotics; injury to bowel, bladder, ureters or other surrounding organs; need for additional procedures including laparotomy or subsequent procedures secondary to abnormal pathology; formation of adhesions; thromboembolic phenomenon; incisional problems and other postoperative/anesthesia complications.  Patient was also advised that this is an outpatient procedure; and expected recovery time after a hysterectomy is 6-8 weeks.  Patient was told that the likelihood that her condition and symptoms will be treated effectively with this surgical management was very high; the postoperative expectations were also discussed in detail. The patient also understands the alternative treatment  options which were discussed in full. All questions were answered.  She was told that she will be contacted by our surgical scheduler regarding the time and date of her surgery; routine preoperative instructions will be given to her by the preoperative nursing team.  Routine postoperative instructions will be reviewed with the patient in detail after surgery.  In the meantime, she will continue Aygestin 10 mg; bleeding precautions were reviewed. Printed patient education handouts about the procedure was given to the patient to review at home. Preop testing ordered. Instructions reviewed, including NPO after midnight. Elevated BP, patient has no history of HTN. Will continue to monitor.  Iron deficiency anemia, patient is taking OTC iron.      A total of 25 minutes were spent  face-to-face with the patient during this encounter and over half of that time involved counseling and coordination of care.   Rubie Maid, MD [facility] at [facility].

## 2022-05-25 NOTE — Patient Instructions (Addendum)
GYNECOLOGY PRE-OPERATIVE INSTRUCTIONS  You are scheduled for surgery on 06/06/2022.  The name of your procedure is: Laparoscopic-Assisted Vaginal Hysterectomy with Bilateral Salpingectomy.   Please read through these instructions carefully regarding preparation for your surgery: Nothing to eat after midnight on the day prior to surgery.  Do not take any medications unless recommended by your provider on day prior to surgery.  Do not take NSAIDs (Motrin, Aleve) or aspirin 7 days prior to surgery.  You may take Tylenol products for minor aches and pains.  You will receive a prescription for pain medications post-operatively.  You will be contacted by phone approximately 1-2 weeks prior to surgery to schedule your pre-operative appointment.  Please call the office if you have any questions regarding your upcoming surgery.    Thank you for choosing [facility] at [facility].

## 2022-06-01 ENCOUNTER — Other Ambulatory Visit: Payer: Self-pay

## 2022-06-01 ENCOUNTER — Encounter
Admission: RE | Admit: 2022-06-01 | Discharge: 2022-06-01 | Disposition: A | Discharge status: Home / Self Care | Payer: Managed Care, Other (non HMO) | Source: Ambulatory Visit | Attending: Obstetrics and Gynecology | Admitting: Obstetrics and Gynecology

## 2022-06-01 DIAGNOSIS — Z01812 Encounter for preprocedural laboratory examination: Secondary | ICD-10-CM

## 2022-06-01 NOTE — Patient Instructions (Addendum)
Your procedure is scheduled on: 06/06/22 - MONDAY Report to the Registration Desk on the 1st floor of the [facility]. To find out your arrival time, please call [phone] between 1PM - 3PM on: 06/03/22 - FRIDAY If your arrival time is 6:00 am, do not arrive prior to that time as the [facility] entrance doors do not open until 6:00 am.  REMEMBER: Instructions that are not followed completely may result in serious medical risk, up to and including death; or upon the discretion of your surgeon and anesthesiologist your surgery may need to be rescheduled.  Do not eat food after midnight the night before surgery.  No gum chewing, lozengers or hard candies.  You may however, drink CLEAR liquids up to 2 hours before you are scheduled to arrive for your surgery. Do not drink anything within 2 hours of your scheduled arrival time.  Clear liquids include: - water  - apple juice without pulp - gatorade (not RED colors) - black coffee or tea (Do NOT add milk or creamers to the coffee or tea) Do NOT drink anything that is not on this list.  In addition, your doctor has ordered for you to drink the provided  Ensure Pre-Surgery Clear Carbohydrate Drink  Drinking this carbohydrate drink up to two hours before surgery helps to reduce insulin resistance and improve patient outcomes. Please complete drinking 2 hours prior to scheduled arrival time.  TAKE THESE MEDICATIONS THE MORNING OF SURGERY WITH A SIP OF WATER: none  One week prior to surgery: Stop Anti-inflammatories (NSAIDS) such as Advil, Aleve, Ibuprofen, Motrin, Naproxen, Naprosyn and Aspirin based products such as Excedrin, Goodys Powder, BC Powder.  Stop ANY OVER THE COUNTER supplements until after surgery.  You may take Tylenol if needed for pain up until the day of surgery.  No Alcohol for 24 hours before or after surgery.  No Smoking including e-cigarettes for 24 hours prior to surgery.  No chewable tobacco products for at  least 6 hours prior to surgery.  No nicotine patches on the day of surgery.  Do not use any "recreational" drugs for at least a week prior to your surgery.  Please be advised that the combination of cocaine and anesthesia may have negative outcomes, up to and including death. If you test positive for cocaine, your surgery will be cancelled.  On the morning of surgery brush your teeth with toothpaste and water, you may rinse your mouth with mouthwash if you wish. Do not swallow any toothpaste or mouthwash.  Use CHG Soap or wipes as directed on instruction sheet.  Do not wear jewelry, make-up, hairpins, clips or nail polish.  Do not wear lotions, powders, or perfumes.   Do not shave body from the neck down 48 hours prior to surgery just in case you cut yourself which could leave a site for infection.  Also, freshly shaved skin may become irritated if using the CHG soap.  Contact lenses, hearing aids and dentures may not be worn into surgery.  Do not bring valuables to the hospital. [facility] is not responsible for any missing/lost belongings or valuables.   Notify your doctor if there is any change in your medical condition (cold, fever, infection).  Wear comfortable clothing (specific to your surgery type) to the hospital.  After surgery, you can help prevent lung complications by doing breathing exercises.  Take deep breaths and cough every 1-2 hours. Your doctor may order a device called an Incentive Spirometer to help you take deep  breaths. When coughing or sneezing, hold a pillow firmly against your incision with both hands. This is called "splinting." Doing this helps protect your incision. It also decreases belly discomfort.  If you are being admitted to the hospital overnight, leave your suitcase in the car. After surgery it may be brought to your room.  If you are being discharged the day of surgery, you will not be allowed to drive home. You will need a responsible adult  (18 years or older) to drive you home and stay with you that night.   If you are taking public transportation, you will need to have a responsible adult (18 years or older) with you. Please confirm with your physician that it is acceptable to use public transportation.   Please call the [facility] Dept. at [phone] if you have any questions about these instructions.  Surgery Visitation Policy:  Patients undergoing a surgery or procedure may have two family members or support persons with them as long as the person is not COVID-19 positive or experiencing its symptoms.   Inpatient Visitation:    Visiting hours are 7 a.m. to 8 p.m. Up to four visitors are allowed at one time in a patient room. The visitors may rotate out with other people during the day. One designated support person (adult) may remain overnight.  MASKING: Due to an increase in RSV rates and hospitalizations, starting Wednesday, Nov. 15, in patient care areas in which we serve newborns, infants and children, masks will be required for teammates and visitors.  Children ages 60 and under may not visit. This policy affects the following departments only:  [facility] Mother Baby Unit Newborn nursery/Special care nursery  Other areas: Masks continue to be strongly recommended for [facility] teammates, visitors and patients in all other areas. Visitation is not restricted outside of the units listed above.

## 2022-06-02 NOTE — Progress Notes (Signed)
Name: Nicole Roman   MRN: [id number]    DOB: 09/12/1975   Date:06/03/2022       Progress Note  Subjective  Chief Complaint  Follow Up  HPI  Goiter : she had a biopsy in the past around 2013, last US done in 2019 showed a nodule but did not require a biopsy, TSH has been normal   B12 deficiency: it was down to 227, she is willing to resume injections. She has been taking otc B12 gummies. She denies fatigue, no tingling or numbness. Last B12 was back to normal at 534   Iron deficiency anemia and B12 deficiency: she had an IUD but has been bleeding heavily about one month ago , had to [facility], they cannot locate IUD and has fibroids, she is going to have a hysterectomy on Monday, last Hemoglobin was down to 9. She denies pica, she is taking some type of iron supplements otc. 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 Aretha Parrot with [facility] in [place] N/A 01/15/2020   Procedure: COLONOSCOPY WITH PROPOFOL;  Surgeon: Virgel Manifold, MD;  Location: [facility] ENDOSCOPY;  Service: Gastroenterology;  Laterality: N/A;   LEEP  2002   tummy tuck  07/30/2017   Dr. Aretha Parrot with [facility] in [place]    Family History  Problem Relation Age of Onset   Cancer Mother        colon   Hypertension Mother    Diabetes Father    Breast cancer Neg Hx     Social History   Tobacco Use   Smoking status: Never   Smokeless tobacco: Never  Substance Use Topics   Alcohol use:  Yes    Comment: occasionally     Current Outpatient Medications:    cyanocobalamin (,VITAMIN B-12,) 1000 MCG/ML injection, INJECT 1 ML (1,000 MCG) INTRAMUSCULARLY EVERY 30 DAYS, Disp: 3 mL, Rfl: 1   norethindrone (AYGESTIN) 5 MG tablet, Take 1 tablet (5 mg total) by mouth daily. Can increase to 2 tablets daily if bleeding does not stop after 1 week., Disp: 30 tablet, Rfl: 2  Allergies  Allergen Reactions   Diflucan [Fluconazole] Rash   Tetanus Toxoids Rash    I personally reviewed active problem list, medication list, allergies, family history, social history, health maintenance with the patient/caregiver today.   ROS  Ten systems reviewed and is negative except as mentioned in HPI   Objective  Vitals:   06/03/22 1031  BP: 134/70  Pulse: 83  Resp: 14  Temp: 98.6 F (37 C)  TempSrc: Oral  SpO2: 99%  Weight: 155 lb 14.4 oz (70.7 kg)  Height: '5\' 4"'$  (1.626 m)    Body mass index is 26.76 kg/m.  Physical Exam  Constitutional: Patient appears well-developed and well-nourished.  No distress.  HEENT: head atraumatic, normocephalic, pupils equal and reactive to light, neck supple Cardiovascular: Normal rate, regular rhythm and normal heart sounds.  No murmur heard.  No BLE edema. Pulmonary/Chest: Effort normal and breath sounds normal. No respiratory distress. Abdominal: Soft.  There is no tenderness. Psychiatric: Patient has a normal mood and affect. behavior is normal. Judgment and thought content normal.   Recent Results (from the past 2160 hour(s))  Cytology - PAP     Status: None   Collection Time: 04/12/22  2:35 PM  Result Value Ref Range   High risk HPV Negative    Adequacy      Satisfactory for evaluation; transformation zone component PRESENT.   Diagnosis      - Negative for intraepithelial lesion or malignancy (NILM)   Comment Normal Reference Range HPV - Negative   CBC with Differential     Status: Abnormal   Collection Time: 04/20/22  4:52 PM  Result  Value Ref Range   WBC 7.0 4.0 - 10.5 K/uL   RBC 3.27 (L) 3.87 - 5.11 MIL/uL   Hemoglobin 9.1 (L) 12.0 - 15.0 g/dL   HCT 29.8 (L) 36.0 - 46.0 %   MCV 91.1 80.0 - 100.0 fL   MCH 27.8 26.0 - 34.0 pg   MCHC 30.5 30.0 - 36.0 g/dL   RDW 13.3 11.5 - 15.5 %   Platelets 192 150 - 400 K/uL   nRBC 0.0 0.0 - 0.2 %   Neutrophils Relative % 69 %   Neutro Abs 4.8 1.7 - 7.7 K/uL   Lymphocytes Relative 24 %   Lymphs Abs 1.7 0.7 - 4.0 K/uL   Monocytes Relative 6 %   Monocytes Absolute 0.4 0.1 - 1.0 K/uL   Eosinophils Relative 1 %   Eosinophils Absolute 0.1 0.0 - 0.5 K/uL   Basophils Relative 0 %   Basophils Absolute 0.0 0.0 - 0.1 K/uL   Immature Granulocytes 0 %   Abs Immature Granulocytes 0.02 0.00 - 0.07 K/uL    Comment: Performed at [facility] [address], [place], [place] [address]  Basic metabolic panel     Status: None   Collection Time: 04/20/22  4:52 PM  Result Value Ref Range   Sodium 137 135 - 145 mmol/L   Potassium 4.5 3.5 - 5.1 mmol/L   Chloride 108 98 - 111 mmol/L   CO2 24 22 - 32 mmol/L   Glucose, Bld 98 70 - 99 mg/dL    Comment: Glucose reference range applies only to samples taken after fasting for at least 8 hours.   BUN 11 6 - 20 mg/dL   Creatinine, Ser 0.58 0.44 - 1.00 mg/dL   Calcium 9.1 8.9 - 10.3 mg/dL   GFR, Estimated >60 >60 mL/min    Comment: (NOTE) Calculated using the CKD-EPI Creatinine Equation (2021)    Anion gap 5 5 - 15    Comment: Performed at [facility] [address], [place], [place] [address]  Type and screen [place]     Status: None   Collection Time: 04/20/22  4:52 PM  Result Value Ref Range   ABO/RH(D) O POS    Antibody Screen NEG    Sample Expiration      04/23/2022,2359 Performed at [facility], [place] [address] [place], [place] [address]   I-Stat Beta hCG blood, ED ([facility], [facility], [facility] only)     Status: None   Collection Time: 04/20/22  5:04 PM  Result Value Ref Range   I-stat hCG, quantitative <5.0  <5 mIU/mL   Comment 3  Comment:   GEST. AGE      CONC.  (mIU/mL)   <=1 WEEK        5 - 50     2 WEEKS       50 - 500     3 WEEKS       100 - 10,000     4 WEEKS     1,000 - 30,000        FEMALE AND NON-PREGNANT FEMALE:     LESS THAN 5 mIU/mL   CBC     Status: Abnormal   Collection Time: 04/21/22 12:52 AM  Result Value Ref Range   WBC 7.7 4.0 - 10.5 K/uL   RBC 3.19 (L) 3.87 - 5.11 MIL/uL   Hemoglobin 9.0 (L) 12.0 - 15.0 g/dL   HCT 28.4 (L) 36.0 - 46.0 %   MCV 89.0 80.0 - 100.0 fL   MCH 28.2 26.0 - 34.0 pg   MCHC 31.7 30.0 - 36.0 g/dL   RDW 13.4 11.5 - 15.5 %   Platelets 185 150 - 400 K/uL   nRBC 0.0 0.0 - 0.2 %    Comment: Performed at [facility], [address] [place], [place] [address]  Surgical pathology     Status: None   Collection Time: 04/22/22 11:30 AM  Result Value Ref Range   SURGICAL PATHOLOGY      SURGICAL PATHOLOGY CASE: [id number] PATIENT: Cheryll Cockayne Surgical Pathology Report     Clinical History: AUB (cm)     FINAL MICROSCOPIC DIAGNOSIS:  A. ENDOMETRIUM, BIOPSY: - Rare fragments of benign endometrial mucosa. - Extensive background hemorrhage.  GROSS DESCRIPTION:  Received in formalin are tan, hemorrhagic soft tissue fragments that are entirely submitted. Volume: 2.0 x 1.7 x 0.2 cm in aggregate cm. (1 B)  (KW, 04/22/2022)    Final Diagnosis performed by Mark Martinique, MD.   Electronically signed 04/26/2022 Technical and / or Professional components performed at [facility]. [facility], [place] [address] [place], [place] [address].  Immunohistochemistry Technical component (if applicable) was performed at [facility]. [address], [place], [place], [place] [address].   IMMUNOHISTOCHEMISTRY DISCLAIMER (if applicable): Some of these immunohistochemical stains may have been developed and the performance characteristics  determine by [facility]. Some may not have been cleared or  approved by the U.S. Food and Drug Administration. The FDA has determined that such clearance or approval is not necessary. This test is used for clinical purposes. It should not be regarded as investigational or for research. This laboratory is certified under the [place] (CLIA-88) as qualified to perform high complexity clinical laboratory testing.  The controls stained appropriately.     PHQ2/9:    06/03/2022   10:30 AM 04/22/2022   10:02 AM 11/30/2021   11:03 AM 07/09/2021   11:29 AM 02/05/2021   10:11 AM  Depression screen PHQ 2/9  Decreased Interest 0 0 0 0 0  Down, Depressed, Hopeless 0 0 0 0 0  PHQ - 2 Score 0 0 0 0 0  Altered sleeping 0  1    Tired, decreased energy 0  0    Change in appetite 0  0    Feeling bad or failure about yourself  0  0    Trouble concentrating 0  0    Moving slowly or fidgety/restless 0  0    Suicidal thoughts 0  0    PHQ-9 Score 0  1      phq 9 is negative   Fall Risk:    06/03/2022   10:30 AM 04/22/2022   10:02 AM 04/12/2022    2:07 PM 11/30/2021   11:03 AM 07/09/2021   11:29 AM  Fall Risk   Falls in the past year? 0 0 0 0 0  Number falls in past yr:  0 0    Injury with Fall?  0 0    Risk for fall due to : No Fall Risks No Fall Risks No Fall Risks No Fall Risks   Follow up Falls prevention discussed;Education provided;Falls evaluation completed Falls evaluation completed Falls evaluation completed Falls prevention discussed Falls prevention discussed      Functional Status Survey: Is the patient deaf or have difficulty hearing?: No Does the patient have difficulty seeing, even when wearing glasses/contacts?: No Does the patient have difficulty concentrating, remembering, or making decisions?: No Does the patient have difficulty walking or climbing stairs?: No Does the patient have difficulty dressing or bathing?: No Does the patient have difficulty doing errands alone such as visiting a  doctor's office or shopping?: No    Assessment & Plan  1. B12 deficiency  - cyanocobalamin (VITAMIN B12) 1000 MCG/ML injection; Inject 1 mL (1,000 mcg total) into the muscle once for 1 dose.  Dispense: 3 mL; Refill: 1  2. Iron deficiency anemia due to chronic blood loss  Going to have hysterectomy   3. Goiter   4. Uterine leiomyoma, unspecified location

## 2022-06-03 ENCOUNTER — Encounter: Payer: Self-pay | Admitting: Family Medicine

## 2022-06-03 ENCOUNTER — Encounter: Payer: Self-pay | Admitting: Urgent Care

## 2022-06-03 ENCOUNTER — Encounter
Admission: RE | Admit: 2022-06-03 | Discharge: 2022-06-03 | Disposition: A | Discharge status: Home / Self Care | Payer: Managed Care, Other (non HMO) | Source: Ambulatory Visit | Attending: Obstetrics and Gynecology | Admitting: Obstetrics and Gynecology

## 2022-06-03 ENCOUNTER — Ambulatory Visit: Payer: Managed Care, Other (non HMO) | Admitting: Family Medicine

## 2022-06-03 ENCOUNTER — Encounter (HOSPITAL_COMMUNITY): Payer: Self-pay

## 2022-06-03 ENCOUNTER — Telehealth: Payer: Self-pay | Admitting: Obstetrics and Gynecology

## 2022-06-03 ENCOUNTER — Other Ambulatory Visit: Payer: Self-pay | Admitting: Obstetrics and Gynecology

## 2022-06-03 VITALS — BP 134/70 | HR 83 | Temp 98.6°F | Resp 14 | Ht 64.0 in | Wt 155.9 lb

## 2022-06-03 DIAGNOSIS — Z01812 Encounter for preprocedural laboratory examination: Secondary | ICD-10-CM | POA: Insufficient documentation

## 2022-06-03 DIAGNOSIS — N939 Abnormal uterine and vaginal bleeding, unspecified: Secondary | ICD-10-CM | POA: Diagnosis not present

## 2022-06-03 DIAGNOSIS — D259 Leiomyoma of uterus, unspecified: Secondary | ICD-10-CM | POA: Diagnosis not present

## 2022-06-03 DIAGNOSIS — D500 Iron deficiency anemia secondary to blood loss (chronic): Secondary | ICD-10-CM

## 2022-06-03 DIAGNOSIS — E538 Deficiency of other specified B group vitamins: Secondary | ICD-10-CM

## 2022-06-03 DIAGNOSIS — E049 Nontoxic goiter, unspecified: Secondary | ICD-10-CM | POA: Diagnosis not present

## 2022-06-03 DIAGNOSIS — Z01818 Encounter for other preprocedural examination: Secondary | ICD-10-CM

## 2022-06-03 LAB — CBC
HCT: 23.8 % — ABNORMAL LOW (ref 36.0–46.0)
Hemoglobin: 7 g/dL — ABNORMAL LOW (ref 12.0–15.0)
MCH: 21.9 pg — ABNORMAL LOW (ref 26.0–34.0)
MCHC: 29.4 g/dL — ABNORMAL LOW (ref 30.0–36.0)
MCV: 74.4 fL — ABNORMAL LOW (ref 80.0–100.0)
Platelets: 332 10*3/uL (ref 150–400)
RBC: 3.2 MIL/uL — ABNORMAL LOW (ref 3.87–5.11)
RDW: 20.2 % — ABNORMAL HIGH (ref 11.5–15.5)
WBC: 5.2 10*3/uL (ref 4.0–10.5)
nRBC: 0 % (ref 0.0–0.2)

## 2022-06-03 MED ORDER — CYANOCOBALAMIN 1000 MCG/ML IJ SOLN: 1000.0000 ug | Freq: Once | INTRAMUSCULAR | 1 refills | Status: AC

## 2022-06-03 NOTE — Progress Notes (Signed)
Contacted patient again, was able to speak with her regarding need for blood transfusion prior to surgery. Patient notes understanding, will report to ER on Sunday around 3-4 pm to check in and to be direct admitted to the hospital.    Rubie Maid, [place] [facility] at [facility]

## 2022-06-03 NOTE — Progress Notes (Addendum)
  [facility] Perioperative Services: Pre-Admission/Anesthesia Testing  Abnormal Lab Notification   Date: 06/03/22  Name: Nicole Roman MRN:   [id number]  Re: Abnormal labs noted during PAT appointment   Notified:  Provider Name Provider Role Notification Mode  Rubie Maid, MD OB/GYN (Surgeon) Routed and/or faxed via [place] and Notes:  ABNORMAL LAB VALUE(S): Lab Results  Component Value Date   HGB 7.0 (L) 06/03/2022   HCT 23.8 (L) 06/03/2022   MCV 74.4 (L) 06/03/2022   MCH 21.9 (L) 06/03/2022   Nicole Roman is scheduled for a LAPAROSCOPIC ASSISTED VAGINAL HYSTERECTOMY WITH SALPINGECTOMY  on 06/06/2022. Preoperative labs reveal a significant microcytic hypochromic anemia.  Hemoglobin of 7.0 g/dL.  Of note, hemoglobin was 9.0 g/dL just 1 month ago.  Iron studies back in May confirmed an IDA diagnosis, however it does not appear as if patient is on an oral iron supplement. B12 was also low at that time. She is taking supplemental B12.  Saw PCP today; notes reviewed. No complaints of chest pain, SOB, diaphoresis, diaphoresis, or presyncope/syncope noted  Sending result to attending surgeon to solicit thoughts on need for preoperative PRBC transfusion prior to proceeding with surgery. Active type and screen on file in preparation active type and screen in place for patient procedure on Monday. Order entered to have CBC rechecked prior to patient's procedure.  Honor Loh, MSN, APRN, FNP-C, CEN [facility]  Peri-operative Services Nurse Practitioner Phone: [phone] Fax: [phone] 06/03/22 11:41 AM

## 2022-06-03 NOTE — Telephone Encounter (Signed)
Contacted patient regarding her recent pre-operative labs.  Patient is again severely anemic.  Would recommend admission prior to scheduled surgery on 11/20 (for hysterectomy) to receive a pre-operative blood transfusion of 2 units. Left message discussing need for admission. Will attempt to reach out again later this afternoon.   Dr. Marcelline Mates

## 2022-06-05 ENCOUNTER — Observation Stay
Admission: RE | Admit: 2022-06-05 | Discharge: 2022-06-06 | Disposition: A | Discharge status: Home / Self Care | Payer: Managed Care, Other (non HMO) | Attending: Obstetrics and Gynecology | Admitting: Obstetrics and Gynecology

## 2022-06-05 DIAGNOSIS — R030 Elevated blood-pressure reading, without diagnosis of hypertension: Secondary | ICD-10-CM | POA: Insufficient documentation

## 2022-06-05 DIAGNOSIS — D500 Iron deficiency anemia secondary to blood loss (chronic): Secondary | ICD-10-CM

## 2022-06-05 DIAGNOSIS — N8501 Benign endometrial hyperplasia: Secondary | ICD-10-CM | POA: Insufficient documentation

## 2022-06-05 DIAGNOSIS — Z01818 Encounter for other preprocedural examination: Secondary | ICD-10-CM

## 2022-06-05 DIAGNOSIS — N72 Inflammatory disease of cervix uteri: Secondary | ICD-10-CM | POA: Diagnosis not present

## 2022-06-05 DIAGNOSIS — D259 Leiomyoma of uterus, unspecified: Principal | ICD-10-CM | POA: Insufficient documentation

## 2022-06-05 DIAGNOSIS — N939 Abnormal uterine and vaginal bleeding, unspecified: Secondary | ICD-10-CM

## 2022-06-05 DIAGNOSIS — Z01812 Encounter for preprocedural laboratory examination: Secondary | ICD-10-CM

## 2022-06-05 DIAGNOSIS — Z90710 Acquired absence of both cervix and uterus: Principal | ICD-10-CM

## 2022-06-05 LAB — PREGNANCY, URINE: Preg Test, Ur: NEGATIVE

## 2022-06-05 LAB — PREPARE RBC (CROSSMATCH)

## 2022-06-05 MED ORDER — LACTATED RINGERS IV SOLN: INTRAVENOUS | Status: DC

## 2022-06-05 MED ORDER — CHLORHEXIDINE GLUCONATE 0.12 % MT SOLN
15.0000 mL | Freq: Once | OROMUCOSAL | Status: AC
  Administered 2022-06-06: 15 mL via OROMUCOSAL
  Filled 2022-06-05: qty 15

## 2022-06-05 MED ORDER — ONDANSETRON HCL 4 MG/2ML IJ SOLN: 4.0000 mg | Freq: Four times a day (QID) | INTRAMUSCULAR | Status: DC | PRN

## 2022-06-05 MED ORDER — GABAPENTIN 300 MG PO CAPS
300.0000 mg | ORAL_CAPSULE | ORAL | Status: AC
  Administered 2022-06-06: 300 mg via ORAL

## 2022-06-05 MED ORDER — IBUPROFEN 600 MG PO TABS
600.0000 mg | ORAL_TABLET | Freq: Four times a day (QID) | ORAL | Status: DC | PRN
  Administered 2022-06-06: 600 mg via ORAL
  Filled 2022-06-05: qty 1

## 2022-06-05 MED ORDER — CELECOXIB 200 MG PO CAPS
400.0000 mg | ORAL_CAPSULE | ORAL | Status: AC
  Administered 2022-06-06: 400 mg via ORAL
  Filled 2022-06-05: qty 2

## 2022-06-05 MED ORDER — ZOLPIDEM TARTRATE 5 MG PO TABS: 5.0000 mg | ORAL_TABLET | Freq: Every evening | ORAL | Status: DC | PRN

## 2022-06-05 MED ORDER — HYDROMORPHONE HCL 1 MG/ML IJ SOLN: 0.2000 mg | INTRAMUSCULAR | Status: DC | PRN

## 2022-06-05 MED ORDER — ORAL CARE MOUTH RINSE: 15.0000 mL | Freq: Once | OROMUCOSAL | Status: AC

## 2022-06-05 MED ORDER — POVIDONE-IODINE 10 % EX SWAB
2.0000 | Freq: Once | CUTANEOUS | Status: AC
  Administered 2022-06-06: 2 via TOPICAL

## 2022-06-05 MED ORDER — ALUM & MAG HYDROXIDE-SIMETH 200-200-20 MG/5ML PO SUSP: 30.0000 mL | ORAL | Status: DC | PRN

## 2022-06-05 MED ORDER — BISACODYL 5 MG PO TBEC: 5.0000 mg | DELAYED_RELEASE_TABLET | Freq: Every day | ORAL | Status: DC | PRN

## 2022-06-05 MED ORDER — ACETAMINOPHEN 325 MG PO TABS
ORAL_TABLET | ORAL | Status: AC
  Filled 2022-06-05: qty 2

## 2022-06-05 MED ORDER — ONDANSETRON HCL 4 MG PO TABS: 4.0000 mg | ORAL_TABLET | Freq: Four times a day (QID) | ORAL | Status: DC | PRN

## 2022-06-05 MED ORDER — DOCUSATE SODIUM 100 MG PO CAPS
100.0000 mg | ORAL_CAPSULE | Freq: Two times a day (BID) | ORAL | Status: DC
  Administered 2022-06-05: 100 mg via ORAL
  Filled 2022-06-05: qty 1

## 2022-06-05 MED ORDER — MAGNESIUM CITRATE PO SOLN: 1.0000 | Freq: Once | ORAL | Status: DC | PRN

## 2022-06-05 MED ORDER — MAGNESIUM HYDROXIDE 400 MG/5ML PO SUSP: 30.0000 mL | Freq: Every day | ORAL | Status: DC | PRN

## 2022-06-05 MED ORDER — SODIUM CHLORIDE 0.9% IV SOLUTION: Freq: Once | INTRAVENOUS | Status: AC

## 2022-06-05 MED ORDER — CEFAZOLIN SODIUM-DEXTROSE 2-4 GM/100ML-% IV SOLN
2.0000 g | INTRAVENOUS | Status: AC
  Administered 2022-06-06: 2 g via INTRAVENOUS
  Filled 2022-06-05: qty 100

## 2022-06-05 MED ORDER — OXYCODONE-ACETAMINOPHEN 5-325 MG PO TABS
1.0000 | ORAL_TABLET | ORAL | Status: DC | PRN
  Administered 2022-06-06: 1 via ORAL
  Filled 2022-06-05: qty 1

## 2022-06-05 MED ORDER — ACETAMINOPHEN 500 MG PO TABS
1000.0000 mg | ORAL_TABLET | ORAL | Status: AC
  Administered 2022-06-06: 1000 mg via ORAL

## 2022-06-05 NOTE — H&P (Signed)
GYNECOLOGY PREOPERATIVE HISTORY AND PHYSICAL   Subjective:  Nicole Roman is a 46 y.o. [id number] admitted for pre-operative blood transfusion secondary to significant anemia.  She is also here for surgical management of fibroid uterus, abnormal uterine bleeding, with history of iron deficiency anemia due to chronic blood loss.  Patient had IUD in place for 2 years, however recent ultrasound no longer noting the presence of the IUD so concerns for expulsion are present. Has been on Aygestin to help manage bleeding until surgical intervention was scheduled, but still notes breakthrough bleeding. Patient was initially considering myomectomy as she and her partner were still desiring conception, however now notes that she desires definitive management with hysterectomy. Is no longer desiring conception. Significant preoperative concerns include iron deficiency anemia.  Proposed surgery: Laparoscopic-assisted vaginal hysterectomy with bilateral salpingectomy   Pertinent Gynecological History: Menses:  flow is irregular, lasting for between 7-14 days, occurs every 2-3 weeks Bleeding: dysfunctional uterine bleeding Contraception: none Last pap: normal Date: 04/12/2022   Past Medical History:  Diagnosis Date   Breast lump    Iron deficiency anemia    Vaginal Pap smear, abnormal     Past Surgical History:  Procedure Laterality Date   AUGMENTATION MAMMAPLASTY Bilateral 2019   breast inplant Bilateral 07/30/2017   Dr. Aretha Parrot with [facility] in [place] N/A 01/15/2020   Procedure: COLONOSCOPY WITH PROPOFOL;  Surgeon: Virgel Manifold, MD;  Location: [facility] ENDOSCOPY;  Service: Gastroenterology;  Laterality: N/A;   LEEP  2002   tummy tuck  07/30/2017   Dr. Aretha Parrot with [facility] in [place]    OB History  Gravida Para Term Preterm AB Living  '3       1 2  '$ SAB IAB Ectopic Multiple Live Births  1       2    # Outcome Date GA Lbr Len/2nd Weight  Sex Delivery Anes PTL Lv  3 Gravida 2004    F Vag-Spont   LIV  2 Gravida 1996    M Vag-Spont   LIV  1 SAB             Family History  Problem Relation Age of Onset   Cancer Mother        colon   Hypertension Mother    Diabetes Father    Breast cancer Neg Hx     Social History   Socioeconomic History   Marital status: Divorced    Spouse name: Not on file   Number of children: 2   Years of education: Not on file   Highest education level: Some college, no degree  Occupational History   Not on file  Tobacco Use   Smoking status: Never   Smokeless tobacco: Never  Vaping Use   Vaping Use: Never used  Substance and Sexual Activity   Alcohol use: Yes    Comment: occasionally   Drug use: No   Sexual activity: Yes    Partners: Male    Birth control/protection: I.U.D.  Other Topics Concern   Not on file  Social History Narrative   Patient is originally from [place]   Dating for many years   Social Determinants of Health   Financial Resource Strain: Medium Risk (11/30/2021)   Overall Financial Resource Strain (CARDIA)    Difficulty of Paying Living Expenses: Somewhat hard  Food Insecurity: No Food Insecurity (11/30/2021)   Hunger Vital Sign    Worried About Running Out of Food in the  Last Year: Never true    [place] in the Last Year: Never true  Transportation Needs: No Transportation Needs (11/30/2021)   PRAPARE - Hydrologist (Medical): No    Lack of Transportation (Non-Medical): No  Physical Activity: Sufficiently Active (11/30/2021)   Exercise Vital Sign    Days of Exercise per Week: 3 days    Minutes of Exercise per Session: 50 min  Stress: No Stress Concern Present (11/30/2021)   [place]    Feeling of Stress : Not at all  Social Connections: Socially Isolated (11/30/2021)   Social Connection and Isolation Panel [NHANES]    Frequency of Communication with  Friends and Family: More than three times a week    Frequency of Social Gatherings with Friends and Family: Three times a week    Attends Religious Services: Never    Active Member of Clubs or Organizations: No    Attends Archivist Meetings: Never    Marital Status: Divorced  Human resources officer Violence: Not At Risk (11/30/2021)   Humiliation, Afraid, Rape, and Kick questionnaire    Fear of Current or Ex-Partner: No    Emotionally Abused: No    Physically Abused: No    Sexually Abused: No    No current facility-administered medications on file prior to encounter.   Current Outpatient Medications on File Prior to Encounter  Medication Sig Dispense Refill   norethindrone (AYGESTIN) 5 MG tablet Take 1 tablet (5 mg total) by mouth daily. Can increase to 2 tablets daily if bleeding does not stop after 1 week. 30 tablet 2   Allergies  Allergen Reactions   Diflucan [Fluconazole] Rash   Tetanus Toxoids Rash     Review of Systems Constitutional: No recent fever/chills/sweats Respiratory: No recent cough/bronchitis Cardiovascular: No chest pain Gastrointestinal: No recent nausea/vomiting/diarrhea Genitourinary: No UTI symptoms Hematologic/lymphatic:No history of coagulopathy or recent blood thinner use    Objective:   Blood pressure (!) 154/83, pulse 81, temperature 100.3 F (37.9 C), temperature source Oral, resp. rate 18, SpO2 100 %. CONSTITUTIONAL: Well-developed, well-nourished female in no acute distress.  HENT:  Normocephalic, atraumatic, External right and left ear normal. Oropharynx is clear and moist EYES: Conjunctivae and EOM are normal. Pupils are equal, round, and reactive to light. No scleral icterus.  NECK: Normal range of motion, supple, no masses SKIN: Skin is warm and dry. No rash noted. Not diaphoretic. No erythema. No pallor. NEUROLOGIC: Alert and oriented to person, place, and time. Normal reflexes, muscle tone coordination. No cranial nerve deficit  noted. PSYCHIATRIC: Normal mood and affect. Normal behavior. Normal judgment and thought content. CARDIOVASCULAR: Normal heart rate noted, regular rhythm RESPIRATORY: Effort and breath sounds normal, no problems with respiration noted ABDOMEN: Soft, nontender, nondistended. PELVIC: Deferred MUSCULOSKELETAL: Normal range of motion. No edema and no tenderness. 2+ distal pulses.    Labs: Lab Results  Component Value Date   WBC 5.2 06/03/2022   HGB 7.0 (L) 06/03/2022   HCT 23.8 (L) 06/03/2022   MCV 74.4 (L) 06/03/2022   PLT 332 06/03/2022    Lab Results  Component Value Date   TSH 0.89 11/27/2020    Pathology: 04/22/2022.  A. ENDOMETRIUM, BIOPSY:  - Rare fragments of benign endometrial mucosa.  - Extensive background hemorrhage.    Imaging Studies: US PELVIC COMPLETE W TRANSVAGINAL AND TORSION R/O CLINICAL DATA:  Heavy vaginal bleeding.  EXAM: TRANSABDOMINAL AND TRANSVAGINAL ULTRASOUND OF PELVIS  DOPPLER  ULTRASOUND OF OVARIES  TECHNIQUE: Both transabdominal and transvaginal ultrasound examinations of the pelvis were performed. Transabdominal technique was performed for global imaging of the pelvis including uterus, ovaries, adnexal regions, and pelvic cul-de-sac.  It was necessary to proceed with endovaginal exam following the transabdominal exam to visualize the endometrium and bilateral ovaries. Color and duplex Doppler ultrasound was utilized to evaluate blood flow to the ovaries.  COMPARISON:  None Available.  FINDINGS: Uterus  Measurements: 10.8 cm x 6.4 cm x 7.0 cm = volume: 251.21 mL. A 3.4 cm x 2.9 cm x 3.6 cm anterior uterine fibroid is noted. Additional 3.1 cm x 2.4 cm x 2.6 cm and 3.0 cm x 2.7 cm x 2.6 cm posterior uterine fibroids are noted on the right.  Endometrium  Thickness: 6.6 mm.  No focal abnormality visualized.  Right ovary  Measurements: 3.1 cm x 1.6 cm x 2.6 cm = volume: 6.6 mL. Normal appearance/no adnexal mass.  Left  ovary  Measurements: 2.7 cm x 1.0 cm x 2.6 cm = volume: 3.7 mL. Normal appearance/no adnexal mass.  Pulsed Doppler evaluation of both ovaries demonstrates normal low-resistance arterial and venous waveforms.  Other findings  No abnormal free fluid.  IMPRESSION: Multiple heterogeneous uterine fibroids.  Electronically Signed   By: Virgina Norfolk M.D.   On: 04/20/2022 17:50    Assessment:    1. Preoperative exam for gynecologic surgery   2. Pre-operative laboratory examination   3. Iron deficiency anemia due to chronic blood loss   4. Abnormal uterine bleeding    Plan:   Patient desires definitive management with hysterectomy.  I proposed doing a laparoscopic-assisted vaginal hysterectomy (LAVH)/total abdominal hysterectomy (TAH)/ total vaginal hysterectomy (TVH) and prophylactic bilateral salpingectomy.  No indication for oophorectomy.  Patient agrees with this proposed surgery.  The risks of surgery were discussed in detail with the patient including but not limited to: bleeding which may require transfusion or reoperation; infection which may require antibiotics; injury to bowel, bladder, ureters or other surrounding organs; need for additional procedures including laparotomy or subsequent procedures secondary to abnormal pathology; formation of adhesions; thromboembolic phenomenon; incisional problems and other postoperative/anesthesia complications.  Patient was also advised that this is an outpatient procedure; and expected recovery time after a hysterectomy is 6-8 weeks.  Patient was told that the likelihood that her condition and symptoms will be treated effectively with this surgical management was very high; the postoperative expectations were also discussed in detail. The patient also understands the alternative treatment options which were discussed in full. All questions were answered.  She was told that she will be contacted by our surgical scheduler regarding the time  and date of her surgery; routine preoperative instructions will be given to her by the preoperative nursing team.  Routine postoperative instructions will be reviewed with the patient in detail after surgery.  In the meantime, she will continue Aygestin 10 mg; bleeding precautions were reviewed. Printed patient education handouts about the procedure was given to the patient to review at home. Preop testing ordered. Instructions reviewed, including NPO after midnight. Elevated BP again noted, patient has no history of HTN. Will likely need treatment after surgery.  Iron deficiency anemia, patient admitted for blood transfusion of 2 units PRBCs.      Rubie Maid, MD [place] [facility] at [facility].

## 2022-06-06 ENCOUNTER — Inpatient Hospital Stay: Payer: Managed Care, Other (non HMO) | Admitting: Certified Registered"

## 2022-06-06 ENCOUNTER — Encounter: Payer: Self-pay | Admitting: Obstetrics and Gynecology

## 2022-06-06 ENCOUNTER — Other Ambulatory Visit: Payer: Self-pay

## 2022-06-06 ENCOUNTER — Encounter
Admission: RE | Disposition: A | Discharge status: Home / Self Care | Payer: Self-pay | Source: Home / Self Care | Attending: Obstetrics and Gynecology

## 2022-06-06 DIAGNOSIS — N939 Abnormal uterine and vaginal bleeding, unspecified: Secondary | ICD-10-CM | POA: Diagnosis not present

## 2022-06-06 DIAGNOSIS — D259 Leiomyoma of uterus, unspecified: Secondary | ICD-10-CM

## 2022-06-06 DIAGNOSIS — Z90710 Acquired absence of both cervix and uterus: Principal | ICD-10-CM | POA: Diagnosis present

## 2022-06-06 DIAGNOSIS — D500 Iron deficiency anemia secondary to blood loss (chronic): Secondary | ICD-10-CM

## 2022-06-06 DIAGNOSIS — N8003 Adenomyosis of the uterus: Secondary | ICD-10-CM

## 2022-06-06 HISTORY — PX: LAPAROSCOPIC VAGINAL HYSTERECTOMY WITH SALPINGECTOMY: SHX6680

## 2022-06-06 LAB — CBC WITH DIFFERENTIAL/PLATELET
Abs Immature Granulocytes: 0.02 10*3/uL (ref 0.00–0.07)
Basophils Absolute: 0 10*3/uL (ref 0.0–0.1)
Basophils Relative: 0 %
Eosinophils Absolute: 0 10*3/uL (ref 0.0–0.5)
Eosinophils Relative: 1 %
HCT: 29.2 % — ABNORMAL LOW (ref 36.0–46.0)
Hemoglobin: 9 g/dL — ABNORMAL LOW (ref 12.0–15.0)
Immature Granulocytes: 0 %
Lymphocytes Relative: 41 %
Lymphs Abs: 2.9 10*3/uL (ref 0.7–4.0)
MCH: 23.7 pg — ABNORMAL LOW (ref 26.0–34.0)
MCHC: 30.8 g/dL (ref 30.0–36.0)
MCV: 76.8 fL — ABNORMAL LOW (ref 80.0–100.0)
Monocytes Absolute: 0.7 10*3/uL (ref 0.1–1.0)
Monocytes Relative: 10 %
Neutro Abs: 3.4 10*3/uL (ref 1.7–7.7)
Neutrophils Relative %: 48 %
Platelets: 310 10*3/uL (ref 150–400)
RBC: 3.8 MIL/uL — ABNORMAL LOW (ref 3.87–5.11)
RDW: 19.7 % — ABNORMAL HIGH (ref 11.5–15.5)
WBC: 7 10*3/uL (ref 4.0–10.5)
nRBC: 0 % (ref 0.0–0.2)

## 2022-06-06 LAB — BPAM RBC
Blood Product Expiration Date: 202312202359
Blood Product Expiration Date: 202312202359
Blood Product Expiration Date: 202312202359
Blood Product Expiration Date: 202312202359
ISSUE DATE / TIME: 202311191722
ISSUE DATE / TIME: 202311191822
ISSUE DATE / TIME: 202311192211
ISSUE DATE / TIME: 202311192216
Unit Type and Rh: 5100
Unit Type and Rh: 5100
Unit Type and Rh: 5100
Unit Type and Rh: 5100

## 2022-06-06 LAB — TYPE AND SCREEN
ABO/RH(D): O POS
Antibody Screen: NEGATIVE
Unit division: 0
Unit division: 0
Unit division: 0
Unit division: 0

## 2022-06-06 SURGERY — HYSTERECTOMY, VAGINAL, LAPAROSCOPY-ASSISTED, WITH SALPINGECTOMY
Anesthesia: General | Site: Pelvis | Laterality: Bilateral

## 2022-06-06 MED ORDER — BUPIVACAINE HCL 0.5 % IJ SOLN
INTRAMUSCULAR | Status: DC | PRN
  Administered 2022-06-06: 16 mL

## 2022-06-06 MED ORDER — CELECOXIB 200 MG PO CAPS
ORAL_CAPSULE | ORAL | Status: AC
  Filled 2022-06-06: qty 2

## 2022-06-06 MED ORDER — DEXAMETHASONE SODIUM PHOSPHATE 10 MG/ML IJ SOLN
INTRAMUSCULAR | Status: DC | PRN
  Administered 2022-06-06: 10 mg via INTRAVENOUS

## 2022-06-06 MED ORDER — PROPOFOL 10 MG/ML IV BOLUS
INTRAVENOUS | Status: AC
  Filled 2022-06-06: qty 20

## 2022-06-06 MED ORDER — ROCURONIUM BROMIDE 100 MG/10ML IV SOLN
INTRAVENOUS | Status: DC | PRN
  Administered 2022-06-06: 10 mg via INTRAVENOUS
  Administered 2022-06-06: 60 mg via INTRAVENOUS

## 2022-06-06 MED ORDER — LACTATED RINGERS IV SOLN: INTRAVENOUS | Status: DC | PRN

## 2022-06-06 MED ORDER — LACTATED RINGERS IV SOLN: INTRAVENOUS | Status: DC

## 2022-06-06 MED ORDER — LIDOCAINE HCL (PF) 2 % IJ SOLN
INTRAMUSCULAR | Status: AC
  Filled 2022-06-06: qty 5

## 2022-06-06 MED ORDER — PHENYLEPHRINE HCL-NACL 20-0.9 MG/250ML-% IV SOLN
INTRAVENOUS | Status: AC
  Filled 2022-06-06: qty 250

## 2022-06-06 MED ORDER — FENTANYL CITRATE (PF) 100 MCG/2ML IJ SOLN
INTRAMUSCULAR | Status: AC
  Filled 2022-06-06: qty 2

## 2022-06-06 MED ORDER — OXYCODONE-ACETAMINOPHEN 5-325 MG PO TABS: 1.0000 | ORAL_TABLET | Freq: Four times a day (QID) | ORAL | 0 refills | Status: DC | PRN

## 2022-06-06 MED ORDER — DOCUSATE SODIUM 100 MG PO CAPS: 100.0000 mg | ORAL_CAPSULE | Freq: Two times a day (BID) | ORAL | 2 refills | Status: DC | PRN

## 2022-06-06 MED ORDER — VASOPRESSIN 20 UNIT/ML IV SOLN
INTRAVENOUS | Status: DC | PRN
  Administered 2022-06-06: 20 mL

## 2022-06-06 MED ORDER — PROPOFOL 500 MG/50ML IV EMUL
INTRAVENOUS | Status: DC | PRN
  Administered 2022-06-06: 25 ug/kg/min via INTRAVENOUS

## 2022-06-06 MED ORDER — OXYCODONE HCL 5 MG PO TABS: 5.0000 mg | ORAL_TABLET | Freq: Once | ORAL | Status: DC | PRN

## 2022-06-06 MED ORDER — GLYCOPYRROLATE 0.2 MG/ML IJ SOLN
INTRAMUSCULAR | Status: DC | PRN
  Administered 2022-06-06: .1 mg via INTRAVENOUS

## 2022-06-06 MED ORDER — PANTOPRAZOLE SODIUM 40 MG PO TBEC
40.0000 mg | DELAYED_RELEASE_TABLET | Freq: Every day | ORAL | Status: DC
  Administered 2022-06-06: 40 mg via ORAL
  Filled 2022-06-06: qty 1

## 2022-06-06 MED ORDER — FENTANYL CITRATE (PF) 100 MCG/2ML IJ SOLN
25.0000 ug | INTRAMUSCULAR | Status: DC | PRN
  Administered 2022-06-06: 25 ug via INTRAVENOUS

## 2022-06-06 MED ORDER — SODIUM CHLORIDE (PF) 0.9 % IJ SOLN
INTRAMUSCULAR | Status: AC
  Filled 2022-06-06: qty 50

## 2022-06-06 MED ORDER — HYDROMORPHONE HCL 1 MG/ML IJ SOLN
INTRAMUSCULAR | Status: AC
  Filled 2022-06-06: qty 1

## 2022-06-06 MED ORDER — PHENYLEPHRINE 80 MCG/ML (10ML) SYRINGE FOR IV PUSH (FOR BLOOD PRESSURE SUPPORT)
PREFILLED_SYRINGE | INTRAVENOUS | Status: AC
  Filled 2022-06-06: qty 10

## 2022-06-06 MED ORDER — VASOPRESSIN 20 UNIT/ML IV SOLN
INTRAVENOUS | Status: AC
  Filled 2022-06-06: qty 1

## 2022-06-06 MED ORDER — MIDAZOLAM HCL 2 MG/2ML IJ SOLN
INTRAMUSCULAR | Status: DC | PRN
  Administered 2022-06-06: 2 mg via INTRAVENOUS

## 2022-06-06 MED ORDER — GABAPENTIN 300 MG PO CAPS
ORAL_CAPSULE | ORAL | Status: AC
  Filled 2022-06-06: qty 1

## 2022-06-06 MED ORDER — IBUPROFEN 600 MG PO TABS: 600.0000 mg | ORAL_TABLET | Freq: Four times a day (QID) | ORAL | 1 refills | Status: DC | PRN

## 2022-06-06 MED ORDER — LIDOCAINE HCL (CARDIAC) PF 100 MG/5ML IV SOSY
PREFILLED_SYRINGE | INTRAVENOUS | Status: DC | PRN
  Administered 2022-06-06: 50 mg via INTRAVENOUS

## 2022-06-06 MED ORDER — DEXAMETHASONE SODIUM PHOSPHATE 10 MG/ML IJ SOLN
INTRAMUSCULAR | Status: AC
  Filled 2022-06-06: qty 1

## 2022-06-06 MED ORDER — PHENYLEPHRINE HCL-NACL 20-0.9 MG/250ML-% IV SOLN
INTRAVENOUS | Status: DC | PRN
  Administered 2022-06-06: 10 ug/min via INTRAVENOUS

## 2022-06-06 MED ORDER — ONDANSETRON HCL 4 MG/2ML IJ SOLN
INTRAMUSCULAR | Status: AC
  Filled 2022-06-06: qty 2

## 2022-06-06 MED ORDER — FENTANYL CITRATE (PF) 100 MCG/2ML IJ SOLN
INTRAMUSCULAR | Status: DC | PRN
  Administered 2022-06-06: 25 ug via INTRAVENOUS
  Administered 2022-06-06: 75 ug via INTRAVENOUS

## 2022-06-06 MED ORDER — ACETAMINOPHEN 500 MG PO TABS
ORAL_TABLET | ORAL | Status: AC
  Filled 2022-06-06: qty 2

## 2022-06-06 MED ORDER — GLYCOPYRROLATE 0.2 MG/ML IJ SOLN
INTRAMUSCULAR | Status: AC
  Filled 2022-06-06: qty 1

## 2022-06-06 MED ORDER — MIDAZOLAM HCL 2 MG/2ML IJ SOLN
INTRAMUSCULAR | Status: AC
  Filled 2022-06-06: qty 2

## 2022-06-06 MED ORDER — BUPIVACAINE HCL (PF) 0.5 % IJ SOLN
INTRAMUSCULAR | Status: AC
  Filled 2022-06-06: qty 30

## 2022-06-06 MED ORDER — PROPOFOL 10 MG/ML IV BOLUS
INTRAVENOUS | Status: DC | PRN
  Administered 2022-06-06: 70 mg via INTRAVENOUS
  Administered 2022-06-06: 100 mg via INTRAVENOUS
  Administered 2022-06-06: 30 mg via INTRAVENOUS

## 2022-06-06 MED ORDER — CHLORHEXIDINE GLUCONATE 0.12 % MT SOLN
OROMUCOSAL | Status: AC
  Filled 2022-06-06: qty 15

## 2022-06-06 MED ORDER — ALBUMIN HUMAN 5 % IV SOLN
INTRAVENOUS | Status: AC
  Filled 2022-06-06: qty 250

## 2022-06-06 MED ORDER — HYDROMORPHONE HCL 1 MG/ML IJ SOLN
INTRAMUSCULAR | Status: DC | PRN
  Administered 2022-06-06 (×2): .25 mg via INTRAVENOUS
  Administered 2022-06-06: .5 mg via INTRAVENOUS

## 2022-06-06 MED ORDER — SUGAMMADEX SODIUM 200 MG/2ML IV SOLN
INTRAVENOUS | Status: DC | PRN
  Administered 2022-06-06 (×2): 100 mg via INTRAVENOUS

## 2022-06-06 MED ORDER — DEXMEDETOMIDINE HCL IN NACL 80 MCG/20ML IV SOLN
INTRAVENOUS | Status: DC | PRN
  Administered 2022-06-06: 4 ug via BUCCAL

## 2022-06-06 MED ORDER — ONDANSETRON HCL 4 MG/2ML IJ SOLN
INTRAMUSCULAR | Status: DC | PRN
  Administered 2022-06-06: 4 mg via INTRAVENOUS

## 2022-06-06 MED ORDER — OXYCODONE HCL 5 MG/5ML PO SOLN: 5.0000 mg | Freq: Once | ORAL | Status: DC | PRN

## 2022-06-06 MED ORDER — 0.9 % SODIUM CHLORIDE (POUR BTL) OPTIME
TOPICAL | Status: DC | PRN
  Administered 2022-06-06: 1000 mL

## 2022-06-06 MED ORDER — CEFAZOLIN SODIUM-DEXTROSE 2-4 GM/100ML-% IV SOLN
INTRAVENOUS | Status: AC
  Filled 2022-06-06: qty 100

## 2022-06-06 MED ORDER — MENTHOL 3 MG MT LOZG
1.0000 | LOZENGE | OROMUCOSAL | Status: DC | PRN
  Filled 2022-06-06: qty 9

## 2022-06-06 SURGICAL SUPPLY — 62 items
BAG URINE DRAIN 2000ML AR STRL (UROLOGICAL SUPPLIES) ×1 IMPLANT
BLADE SURG 15 STRL LF DISP TIS (BLADE) ×1 IMPLANT
BLADE SURG 15 STRL SS (BLADE) ×1
BLADE SURG SZ10 CARB STEEL (BLADE) ×1 IMPLANT
BLADE SURG SZ11 CARB STEEL (BLADE) ×1 IMPLANT
CATH FOLEY 2WAY  5CC 16FR (CATHETERS) ×1
CATH URTH 16FR FL 2W BLN LF (CATHETERS) ×1 IMPLANT
CHLORAPREP W/TINT 26 (MISCELLANEOUS) ×1 IMPLANT
DERMABOND ADVANCED .7 DNX12 (GAUZE/BANDAGES/DRESSINGS) ×1 IMPLANT
ELECT REM PT RETURN 9FT ADLT (ELECTROSURGICAL) ×1
ELECTRODE REM PT RTRN 9FT ADLT (ELECTROSURGICAL) ×1 IMPLANT
GAUZE 4X4 16PLY ~~LOC~~+RFID DBL (SPONGE) ×3 IMPLANT
GAUZE PACK 2X3YD (PACKING) ×1 IMPLANT
GLOVE BIO SURGEON STRL SZ 6.5 (GLOVE) ×1 IMPLANT
GLOVE BIO SURGEON STRL SZ8 (GLOVE) ×2 IMPLANT
GLOVE PI ORTHO PRO STRL 7.5 (GLOVE) ×1 IMPLANT
GLOVE SURG POLY ORTHO LF SZ7.5 (GLOVE) ×1 IMPLANT
GLOVE SURG UNDER LTX SZ7 (GLOVE) ×2 IMPLANT
GOWN STRL REUS W/ TWL LRG LVL3 (GOWN DISPOSABLE) ×2 IMPLANT
GOWN STRL REUS W/ TWL XL LVL3 (GOWN DISPOSABLE) ×1 IMPLANT
GOWN STRL REUS W/TWL LRG LVL3 (GOWN DISPOSABLE) ×2
GOWN STRL REUS W/TWL XL LVL3 (GOWN DISPOSABLE) ×1
IRRIGATION STRYKERFLOW (MISCELLANEOUS) ×1 IMPLANT
IRRIGATOR STRYKERFLOW (MISCELLANEOUS) ×1
IV LACTATED RINGERS 1000ML (IV SOLUTION) ×1 IMPLANT
KIT PINK PAD W/HEAD ARE REST (MISCELLANEOUS) ×1
KIT PINK PAD W/HEAD ARM REST (MISCELLANEOUS) ×1 IMPLANT
KIT TURNOVER CYSTO (KITS) ×1 IMPLANT
LABEL OR SOLS (LABEL) ×1 IMPLANT
LIGASURE LAP MARYLAND 5MM 37CM (ELECTROSURGICAL) ×1 IMPLANT
MANIFOLD NEPTUNE II (INSTRUMENTS) ×1 IMPLANT
MANIPULATOR VCARE LG CRV RETR (MISCELLANEOUS) IMPLANT
MANIPULATOR VCARE SML CRV RETR (MISCELLANEOUS) IMPLANT
MANIPULATOR VCARE STD CRV RETR (MISCELLANEOUS) IMPLANT
NDL SPNL 22GX3.5 QUINCKE BK (NEEDLE) ×1 IMPLANT
NEEDLE SPNL 22GX3.5 QUINCKE BK (NEEDLE) ×1 IMPLANT
NEEDLE VERESS 14GA 120MM (NEEDLE) IMPLANT
PACK BASIN MINOR ARMC (MISCELLANEOUS) ×1 IMPLANT
PACK GYN LAPAROSCOPIC (MISCELLANEOUS) ×1 IMPLANT
PAD OB MATERNITY 4.3X12.25 (PERSONAL CARE ITEMS) ×1 IMPLANT
SCISSORS METZENBAUM CVD 33 (INSTRUMENTS) ×1 IMPLANT
SCRUB CHG 4% DYNA-HEX 4OZ (MISCELLANEOUS) ×1 IMPLANT
SHEARS HARMONIC ACE PLUS 36CM (ENDOMECHANICALS) IMPLANT
SLEEVE Z-THREAD 5X100MM (TROCAR) ×2 IMPLANT
STRIP CLOSURE SKIN 1/2X4 (GAUZE/BANDAGES/DRESSINGS) ×1 IMPLANT
SUT MNCRL 4-0 (SUTURE) ×1
SUT MNCRL 4-0 27XMFL (SUTURE) ×1
SUT VIC AB 0 CT1 27 (SUTURE) ×2
SUT VIC AB 0 CT1 27XCR 8 STRN (SUTURE) ×1 IMPLANT
SUT VIC AB 0 CT1 36 (SUTURE) ×1 IMPLANT
SUT VIC AB 0 CT2 27 (SUTURE) ×1 IMPLANT
SUT VIC AB 2-0 CT1 (SUTURE) ×1 IMPLANT
SUT VIC AB 4-0 FS2 27 (SUTURE) ×1 IMPLANT
SUTURE MNCRL 4-0 27XMF (SUTURE) ×1 IMPLANT
SYR 10ML LL (SYRINGE) ×1 IMPLANT
SYR 50ML LL SCALE MARK (SYRINGE) IMPLANT
SYR CONTROL 10ML LL (SYRINGE) ×1 IMPLANT
TAPE TRANSPORE STRL 2 31045 (GAUZE/BANDAGES/DRESSINGS) ×1 IMPLANT
TRAP FLUID SMOKE EVACUATOR (MISCELLANEOUS) ×1 IMPLANT
TROCAR XCEL NON-BLD 5MMX100MML (ENDOMECHANICALS) ×1 IMPLANT
TUBING EVAC SMOKE HEATED PNEUM (TUBING) ×1 IMPLANT
WATER STERILE IRR 500ML POUR (IV SOLUTION) ×1 IMPLANT

## 2022-06-06 NOTE — Transfer of Care (Signed)
Immediate Anesthesia Transfer of Care Note  Patient: Nicole Roman  Procedure(s) Performed: LAPAROSCOPIC ASSISTED VAGINAL HYSTERECTOMY WITH SALPINGECTOMY (Bilateral: Pelvis)  Patient Location: PACU  Anesthesia Type:General  Level of Consciousness: drowsy  Airway & Oxygen Therapy: Patient Spontanous Breathing and Patient connected to face mask oxygen  Post-op Assessment: Report given to RN and Post -op Vital signs reviewed and stable  Post vital signs: Reviewed and stable  Last Vitals:  Vitals Value Taken Time  BP 146/84 06/06/22 0953  Temp    Pulse 82 06/06/22 0959  Resp 19 06/06/22 0959  SpO2 100 % 06/06/22 0959  Vitals shown include unvalidated device data.  Last Pain:  Vitals:   06/06/22 0640  TempSrc: Temporal  PainSc: 0-No pain         Complications: No notable events documented.

## 2022-06-06 NOTE — Discharge Summary (Signed)
Gynecology Physician Postoperative Discharge Summary  Patient ID: Raylinn Kosar MRN: [id number] DOB/AGE: Feb 26, 1976 46 y.o.  Admit Date: 06/05/2022 Discharge Date: 06/06/2022  Preoperative Diagnoses:  Iron deficiency anemia due to chronic blood loss, Abnormal uterine bleeding, fibroid uterus    Procedures: Procedure(s) (LRB): LAPAROSCOPIC ASSISTED VAGINAL HYSTERECTOMY WITH SALPINGECTOMY (Bilateral)  Hospital Course:  Parish Augustine is a 45 y.o. [id number] admitted for pre-operative blood transfusion prior to scheduled surgery.  She received 2 units PRBCs without any adverse events on HD#1.  On HD#2 She underwent the procedures as mentioned above, her operation was uncomplicated. For further details about surgery, please refer to the operative report. Patient had an uncomplicated postoperative course. By time of discharge on POD#0, her pain was controlled on oral pain medications; she was ambulating, voiding without difficulty, tolerating regular diet and passing flatus. She was deemed stable for discharge to home.   Significant Labs:    Latest Ref Rng & Units 06/06/2022    3:41 AM 06/03/2022    8:56 AM 04/21/2022   12:52 AM  CBC  WBC 4.0 - 10.5 K/uL 7.0  5.2  7.7   Hemoglobin 12.0 - 15.0 g/dL 9.0  7.0  9.0   Hematocrit 36.0 - 46.0 % 29.2  23.8  28.4   Platelets 150 - 400 K/uL 310  332  185     Lab Results  Component Value Date   CREATININE 0.58 04/20/2022   BUN 11 04/20/2022   NA 137 04/20/2022   K 4.5 04/20/2022   CL 108 04/20/2022   CO2 24 04/20/2022      Discharge Exam: Blood pressure (!) 156/91, pulse 60, temperature 97.9 F (36.6 C), temperature source Oral, resp. rate 16, height '5\' 4"'$  (1.626 m), weight 70.7 kg, SpO2 100 %. General appearance: alert and no distress  Resp: clear to auscultation bilaterally  Cardio: regular rate and rhythm  GI: soft, non-tender; bowel sounds normal; no masses, no organomegaly.  Incision: C/D/I, no erythema, no drainage  noted Pelvic: scant blood on pad (done in presence of RN as chaperone)  Extremities: extremities normal, atraumatic, no cyanosis or edema and Homans sign is negative, no sign of DVT  Discharged Condition: Stable  Disposition: Discharge disposition: 01-Home or Self Care       Discharge Instructions     Discharge patient   Complete by: As directed    Discharge disposition: 01-Home or Self Care   Discharge patient date: 06/06/2022      Allergies as of 06/06/2022       Reactions   Diflucan [fluconazole] Rash   Tetanus Toxoids Rash        Medication List     STOP taking these medications    norethindrone 5 MG tablet Commonly known as: AYGESTIN       TAKE these medications    docusate sodium 100 MG capsule Commonly known as: COLACE Take 1 capsule (100 mg total) by mouth 2 (two) times daily as needed.   ibuprofen 600 MG tablet Commonly known as: ADVIL Take 1 tablet (600 mg total) by mouth every 6 (six) hours as needed for mild pain or moderate pain (mild pain).   oxyCODONE-acetaminophen 5-325 MG tablet Commonly known as: PERCOCET/ROXICET Take 1-2 tablets by mouth every 6 (six) hours as needed for severe pain (moderate to severe pain (when tolerating fluids)).       Future Appointments  Date Time Provider Vici  06/22/2022  1:30 PM Rubie Maid, MD [facility] None  12/06/2022 10:00 AM  Steele Sizer, MD [facility]  06/05/2023  8:40 AM Ancil Boozer Drue Stager, MD [facility]    Follow-up Information     [facility].   Specialty: Obstetrics and Gynecology Why: post-operative visit with Dr. Marcelline Mates in 10-14 days Contact information: [address] [place] [address] [phone]                Total discharge time: 15 minutes   Signed:  Rubie Maid, MD [place] at [facility]

## 2022-06-06 NOTE — Progress Notes (Signed)
To O.R. via Bed. Transported by Lyda Perone NT.

## 2022-06-06 NOTE — Anesthesia Procedure Notes (Signed)
Procedure Name: Intubation Date/Time: 06/06/2022 7:45 AM  Performed by: Adalberto Ill, CRNAPre-anesthesia Checklist: Patient identified, Emergency Drugs available, Suction available, Patient being monitored and Timeout performed Patient Re-evaluated:Patient Re-evaluated prior to induction Oxygen Delivery Method: Circle system utilized Preoxygenation: Pre-oxygenation with 100% oxygen Induction Type: IV induction Ventilation: Mask ventilation without difficulty Laryngoscope Size: Miller and 2 Grade View: Grade I Tube type: Oral Tube size: 7.0 mm Number of attempts: 1 (brief atraumatic) Airway Equipment and Method: Stylet Placement Confirmation: ETT inserted through vocal cords under direct vision, positive ETCO2 and breath sounds checked- equal and bilateral Secured at: 21 cm Tube secured with: Tape Dental Injury: Teeth and Oropharynx as per pre-operative assessment

## 2022-06-06 NOTE — Progress Notes (Signed)
Patient discharged home with family.  Discharge instructions, when to follow up, and prescriptions reviewed with patient.  Patient verbalized understanding. Patient will be escorted out by auxiliary.   

## 2022-06-06 NOTE — Progress Notes (Signed)
UPDATE TO PREVIOUS HISTORY AND PHYSICAL  The patient has been seen and examined.  H&P is up to date, no changes noted. Is now s/p blood transfusion 2 units preoperatively. All questions answered. Patient can proceed to the OR for scheduled procedure.       Latest Ref Rng & Units 06/06/2022    3:41 AM 06/03/2022    8:56 AM 04/21/2022   12:52 AM  CBC  WBC 4.0 - 10.5 K/uL 7.0  5.2  7.7   Hemoglobin 12.0 - 15.0 g/dL 9.0  7.0  9.0   Hematocrit 36.0 - 46.0 % 29.2  23.8  28.4   Platelets 150 - 400 K/uL 310  332  Sanborn, Chane Cowden, MD 06/06/2022 7:19 AM

## 2022-06-06 NOTE — Op Note (Signed)
Procedure(s): LAPAROSCOPIC ASSISTED VAGINAL HYSTERECTOMY WITH SALPINGECTOMY Procedure Note  Nicole Roman female 46 y.o. 06/06/2022  Indications: The patient is a 46 y.o. [id number] female with iron deficiency anemia due to chronic blood loss, fibroid uterus, abnormal uterine bleeding s/p failed medical management. S/p pre-operative blood transfusion of 2 units PRBCs. Patient also with prior history of abdominoplasty.  Pre-operative Diagnosis: iron deficiency anemia due to chronic blood loss, fibroid uterus, abnormal uterine bleeding s/p failed medical management, recent blood transfusion. History of abdominoplasty.  Post-operative Diagnosis: Same  Surgeon: Rubie Maid, MD  Assistants:  [name], MD.   Anesthesia: General endotracheal anesthesia  Findings: The uterus was sounded to 10 cm Fallopian tubes and ovaries appeared normal.   Enlarged fibroid uterus with irregular contours noted.  Prior abdominoplasty scar  Procedure Details: The patient was seen in the Holding Room. The risks, benefits, complications, treatment options, and expected outcomes were discussed with the patient.  The patient concurred with the proposed plan, giving informed consent.  The site of surgery properly noted/marked. The patient was taken to the Operating Room, identified as Nicole Roman and the procedure verified as Procedure(s) (LRB): LAPAROSCOPIC ASSISTED VAGINAL HYSTERECTOMY WITH SALPINGECTOMY (Bilateral).   She was then placed under general anesthesia without difficulty. She was placed in the dorsal lithotomy position, and was prepped and draped in a sterile manner.  A Time Out was held and the above information confirmed. A foley catheter was inserted and placed to gravity drainage.  A sterile speculum was inserted into the vagina and the cervix was grasped at the anterior lip using a single-toothed tenaculum.  A Hulka clamp was placed for uterine manipulation.  The speculum and tenaculum  were then removed.  Attention was turned to the abdomen where an umbilical incision was made with the scalpel.  Attempts with insertion of the Optiview 5-mm trocar and sleeve were noted to be difficult due to the retraction of the abdomen from the previous abdominoplasty.  The decision was made to then gain entry into the cavity using the Veress needle.  Once entry into the cavity was confirmed, the abdomen was allowed to insufflate and a pneumoperitoneum was obtained.  The Optiview 5-mm trocar and sleeve were then advanced without difficulty with the laparoscope under direct visualization into the abdomen.  Bilateral 5-mm lower quadrant ports were then placed under direct visualization.  A survey of the patient's pelvis and abdomen revealed the findings as above.    Attention was turned to the fallopian tube on the patient's right Roman, which was clamped at the mesosalpinx the Ligasure device and ligated. The broad ligament was also ligated with the Ligasure device working towards the round ligament.  The round and broad ligaments were then clamped and transected with the Ligasure device.  The ureter were noted to be safely away from the area of dissection.  The uterine artery was then skeletonized and a bladder flap was created.  The bladder was then bluntly dissected off the lower uterine segment.  At this point, attention was turned to the right uterine vessels, which were clamped and ligated using the Ligasure device.  Attention was then turned to the patient's left Roman, which was treated in a similar manner by taking the fallopian tube, the round ligament and the broad ligaments and the bladder flap creation was completed. The left uterine vessels were also clamped and transected in a similar fashion. The decision was made to proceed with completing the hysterectomy via the vaginal route .  The laparoscope was used to survey the operative site, with good hemostasis noted.  No intraoperative injury to other  surrounding organs was noted.  The abdomen was desufflated and all instruments were then removed from the patient's abdomen.   All skin incisions were closed with a subcuticular stitch of 4-0 Monocryl and covered with Dermabond.  The incisions were injected with a total of 15 ml of 0.5% Sensorcaine for analgesia.   Attention was then turned to her pelvis.  A weighted speculum was then placed in the vagina, and the anterior and posterior lips of the cervix were grasped bilaterally with tenaculums.  The cervix was then injected circumferentially with 0.5% Seonsorcaine with epinephrine solution to maintain hemostasis.  The cervix was then circumferentially incised, and the posterior cul-de-sac was entered sharply without difficulty.  A long weighted speculum was inserted into the posterior cul-de-sac. The Heaney clamp was then used to clamp the uterosacral ligaments on either Roman.  They were then cut and sutured ligated with 0 Vicryl, and were held with a tag for later identification. Of note, all sutures used in this case were 0 Vicryl unless otherwise noted.   The cardinal ligaments were then clamped, cut and ligated bilaterally.  At this point, full entry into the anterior cul-de-sac was made, no injury to the bladder was noted.  The uterus and tubes were freed from all ligaments and was then delivered and sent to pathology.  Anchoring sutures were placed at each uterosacral ligament using 0-Vicryl.  The posterior vaginal cuff was then reperitonealized using a 0-Vicryl.  The peritoneum was then closed in a pursestring fashion using a 0-Vicryl. The vaginal cuff was then closed with figure-of-eight sutures using 0 Vicryl, with care given to incorporate the uterosacral pedicles bilaterally.  All instruments were then removed from the pelvis.  The foley catheter was removed.   The patient tolerated the procedures well.  All instruments, needles, and sponge counts were correct  times three. The patient was taken to  the recovery room awake, extubated and in stable condition.   An experienced assistant was required given the standard of surgical care given the complexity of the case.  This assistant was needed for exposure, dissection, suctioning, retraction, instrument exchange, and for overall help during the procedure.   Estimated Blood Loss:  100 ml      Drains: straight catheterization prior to procedure with  300 ml of clear urine         Total IV Fluids:  600 ml  Specimens: Uterus with cervix and bilateral fallopian tubes         Implants: None         Complications:  None; patient tolerated the procedure well.         Disposition: PACU - hemodynamically stable.         Condition: stable   Rubie Maid, MD [place] [facility] at [facility]

## 2022-06-06 NOTE — Anesthesia Preprocedure Evaluation (Signed)
Anesthesia Evaluation  Patient identified by MRN, date of birth, ID band Patient awake    Reviewed: Allergy & Precautions, NPO status , Patient's Chart, lab work & pertinent test results  History of Anesthesia Complications Negative for: history of anesthetic complications  Airway Mallampati: III  TM Distance: >3 FB Neck ROM: full    Dental  (+) Dental Advidsory Given, Teeth Intact   Pulmonary neg pulmonary ROS, neg sleep apnea, neg COPD   Pulmonary exam normal        Cardiovascular (-) angina (-) Past MI and (-) CABG negative cardio ROS Normal cardiovascular exam     Neuro/Psych negative neurological ROS  negative psych ROS   GI/Hepatic negative GI ROS, Neg liver ROS,,,  Endo/Other  negative endocrine ROS    Renal/GU      Musculoskeletal   Abdominal   Peds  Hematology negative hematology ROS (+)   Anesthesia Other Findings Past Medical History: No date: Breast lump No date: Iron deficiency anemia No date: Vaginal Pap smear, abnormal  Past Surgical History: 2019: AUGMENTATION MAMMAPLASTY; Bilateral 07/30/2017: breast inplant; Bilateral     Comment:  Dr. Aretha Parrot with [facility] in [facility] 01/15/2020: COLONOSCOPY WITH PROPOFOL; N/A     Comment:  Procedure: COLONOSCOPY WITH PROPOFOL;  Surgeon:               Virgel Manifold, MD;  Location: [facility] ENDOSCOPY;                Service: Gastroenterology;  Laterality: N/A; 2002: LEEP 07/30/2017: tummy tuck     Comment:  Dr. Aretha Parrot with [facility] in [facility]  BMI    Body Mass Index: 26.76 kg/m      Reproductive/Obstetrics negative OB ROS                             Anesthesia Physical Anesthesia Plan  ASA: 2  Anesthesia Plan: General ETT   Post-op Pain Management:    Induction: Intravenous  PONV Risk Score and Plan: 4 or greater and Midazolam, Dexamethasone and Ondansetron  Airway Management Planned: Oral  ETT  Additional Equipment:   Intra-op Plan:   Post-operative Plan: Extubation in OR  Informed Consent: I have reviewed the patients History and Physical, chart, labs and discussed the procedure including the risks, benefits and alternatives for the proposed anesthesia with the patient or authorized representative who has indicated his/her understanding and acceptance.     Dental Advisory Given  Plan Discussed with: Anesthesiologist, CRNA and Surgeon  Anesthesia Plan Comments: (Patient consented for risks of anesthesia including but not limited to:  - adverse reactions to medications - damage to eyes, teeth, lips or other oral mucosa - nerve damage due to positioning  - sore throat or hoarseness - Damage to heart, brain, nerves, lungs, other parts of body or loss of life  Patient voiced understanding.)       Anesthesia Quick Evaluation

## 2022-06-06 NOTE — Anesthesia Postprocedure Evaluation (Signed)
Anesthesia Post Note  Patient: Nicole Roman  Procedure(s) Performed: LAPAROSCOPIC ASSISTED VAGINAL HYSTERECTOMY WITH SALPINGECTOMY (Bilateral: Pelvis)  Patient location during evaluation: PACU Anesthesia Type: General Level of consciousness: awake and alert Pain management: pain level controlled Vital Signs Assessment: post-procedure vital signs reviewed and stable Respiratory status: spontaneous breathing, nonlabored ventilation, respiratory function stable and patient connected to nasal cannula oxygen Cardiovascular status: blood pressure returned to baseline and stable Postop Assessment: no apparent nausea or vomiting Anesthetic complications: no  No notable events documented.   Last Vitals:  Vitals:   06/06/22 1038 06/06/22 1145  BP:  (!) 156/91  Pulse: 68 60  Resp: 10 16  Temp:  36.6 C  SpO2: 99% 100%    Last Pain:  Vitals:   06/06/22 1150  TempSrc:   PainSc: [place]

## 2022-06-07 ENCOUNTER — Encounter: Payer: Self-pay | Admitting: Obstetrics and Gynecology

## 2022-06-07 LAB — SURGICAL PATHOLOGY

## 2022-06-21 ENCOUNTER — Encounter: Payer: Self-pay | Admitting: Obstetrics and Gynecology

## 2022-06-21 NOTE — Progress Notes (Unsigned)
    OBSTETRICS/GYNECOLOGY POST-OPERATIVE CLINIC VISIT  Subjective:     Nicole Roman is a 46 y.o. female who presents to the clinic {1-10:13787} weeks status post {gyn surgeries:13997} for {gyn surg indications maj:13998}. Eating a regular diet {with-without:5700} difficulty. Bowel movements are {normal/abnormal***:19619}. {pain control:13522::"The patient is not having any pain."}  {Common ambulatory SmartLinks:19316}  Review of Systems {ros; complete:30496}   Objective:   LMP  (LMP Unknown)  There is no height or weight on file to calculate BMI.  General:  alert and no distress  Abdomen: soft, bowel sounds active, non-tender  Incision:   {incision:13716::"no dehiscence","incision well approximated","healing well","no drainage","no erythema","no hernia","no seroma","no swelling"}    Pathology:    Assessment:   Patient s/p *** (surgery)  {doing well:13525::"Doing well postoperatively."}   Plan:   1. Continue any current medications as instructed by provider. 2. Wound care discussed. 3. Operative findings again reviewed. Pathology report discussed. 4. Activity restrictions: {restrictions:13723} 5. Anticipated return to work: {work return:14002}. 6. Follow up: {0-98:11914} {time; units:18646} for ***    Landis Gandy, [facility]

## 2022-06-22 ENCOUNTER — Ambulatory Visit (INDEPENDENT_AMBULATORY_CARE_PROVIDER_SITE_OTHER): Payer: Managed Care, Other (non HMO) | Admitting: Obstetrics and Gynecology

## 2022-06-22 ENCOUNTER — Encounter: Payer: Self-pay | Admitting: Obstetrics and Gynecology

## 2022-06-22 VITALS — BP 146/91 | HR 79 | Ht 64.0 in | Wt 152.8 lb

## 2022-06-22 DIAGNOSIS — Z4889 Encounter for other specified surgical aftercare: Secondary | ICD-10-CM

## 2022-06-22 DIAGNOSIS — Z90710 Acquired absence of both cervix and uterus: Secondary | ICD-10-CM

## 2022-06-22 DIAGNOSIS — D500 Iron deficiency anemia secondary to blood loss (chronic): Secondary | ICD-10-CM

## 2022-06-22 DIAGNOSIS — R030 Elevated blood-pressure reading, without diagnosis of hypertension: Secondary | ICD-10-CM

## 2022-06-24 MED ORDER — METRONIDAZOLE 500 MG PO TABS: 500.0000 mg | ORAL_TABLET | Freq: Two times a day (BID) | ORAL | 0 refills | Status: DC

## 2022-07-07 MED ORDER — METRONIDAZOLE 500 MG PO TABS: 500.0000 mg | ORAL_TABLET | Freq: Two times a day (BID) | ORAL | 0 refills | Status: DC

## 2022-07-07 NOTE — Addendum Note (Signed)
Addended by: Augusto Gamble on: 07/07/2022 05:15 PM   Modules accepted: Orders

## 2022-07-08 ENCOUNTER — Other Ambulatory Visit (HOSPITAL_COMMUNITY)
Admission: RE | Admit: 2022-07-08 | Discharge: 2022-07-08 | Disposition: A | Discharge status: Home / Self Care | Payer: Managed Care, Other (non HMO) | Source: Ambulatory Visit | Attending: Obstetrics and Gynecology | Admitting: Obstetrics and Gynecology

## 2022-07-08 ENCOUNTER — Ambulatory Visit (INDEPENDENT_AMBULATORY_CARE_PROVIDER_SITE_OTHER): Payer: Managed Care, Other (non HMO)

## 2022-07-08 VITALS — BP 140/92 | HR 78 | Resp 16 | Ht 64.0 in | Wt 154.3 lb

## 2022-07-08 DIAGNOSIS — N898 Other specified noninflammatory disorders of vagina: Secondary | ICD-10-CM | POA: Insufficient documentation

## 2022-07-08 NOTE — Progress Notes (Signed)
    NURSE VISIT NOTE  Subjective:    Patient ID: Nicole Roman, female    DOB: [birth year], 46 y.o.   MRN: [id number]  HPI  Patient is a 46 y.o. [id number] female who presents for evaluation of vaginitis. She is status post hysterectomy. She has concerns about having vaginal odor x 2 weeks. She was put on antibiotics and it resolved it and then it came back. She denies pain or itching.   The following portions of the patient's history were reviewed and updated as appropriate: allergies, current medications, past family history, past medical history, past social history, past surgical history, and problem list.  Review of Systems Pertinent items noted in HPI and remainder of comprehensive ROS otherwise negative.   Objective:   There were no vitals taken for this visit. There is no height or weight on file to calculate BMI. General appearance: alert, cooperative, and no distress  Assessment:   No diagnosis found.   Plan:

## 2022-07-08 NOTE — Patient Instructions (Signed)
Vaginitis  Vaginitis is irritation and swelling of the vagina. Treatment will depend on the cause. What are the causes? It can be caused by: Bacteria. Yeast. A parasite. A virus. Low hormone levels. Bubble baths, scented tampons, and feminine sprays. Other things can change the balance of the yeast and bacteria that live in the vagina. These include: Antibiotic medicines. Not being clean enough. Some birth control methods. Sex. Infection. Diabetes. A weakened body defense system (immune system). What increases the risk? Smoking or being around someone who smokes. Using washes (douches), scented tampons, or scented pads. Wearing tight pants or thong underwear. Using birth control pills or an IUD. Having sex without a condom or having a lot of partners. Having an STI. Using a certain product to kill sperm (nonoxynol-9). Eating foods that are high in sugar. Having diabetes. Having low levels of a female hormone. Having a weakened body defense system. Being pregnant or breastfeeding. What are the signs or symptoms? Fluid coming from the vagina that is not normal. A bad smell. Itching, pain, or swelling. Pain with sex. Pain or burning when you pee (urinate). Sometimes there are no symptoms. How is this treated? Treatment may include: Antibiotic creams or pills. Antifungal medicines. Medicines to ease symptoms if you have a virus. Your sex partner should also be treated. Estrogen medicines. Avoiding scented soaps, sprays, or douches. Stopping use of products that caused irritation and then using a cream to treat symptoms. Follow these instructions at home: Lifestyle Keep the area around your vagina clean and dry. Avoid using soap. Rinse the area with water. Until your doctor says it is okay: Do not use washes for the vagina. Do not use tampons. Do not have sex. Wipe from front to back after going to the bathroom. When your doctor says it is okay, practice safe sex  and use condoms. General instructions Take over-the-counter and prescription medicines only as told by your doctor. If you were prescribed an antibiotic medicine, take or use it as told by your doctor. Do not stop taking or using it even if you start to feel better. Keep all follow-up visits. How is this prevented? Do not use things that can irritate the vagina, such as fabric softeners. Avoid these products if they are scented: Sprays. Detergents. Tampons. Products for cleaning the vagina. Soaps or bubble baths. Let air reach your vagina. To do this: Wear cotton underwear. Do not wear: Underwear while you sleep. Tight pants. Thong underwear. Underwear or nylons without a cotton panel. Take off any wet clothing, such as bathing suits, as soon as you can. Practice safe sex and use condoms. Contact a doctor if: You have pain in your belly or in the area between your hips. You have a fever or chills. Your symptoms last for more than 2-3 days. Get help right away if: You have a fever and your symptoms get worse all of a sudden. Summary Vaginitis is irritation and swelling of the vagina. Treatment will depend on the cause of the condition. Do not use washes or tampons or have sex until your doctor says it is okay. This information is not intended to replace advice given to you by your health care provider. Make sure you discuss any questions you have with your health care provider. Document Revised: 01/02/2020 Document Reviewed: 01/02/2020 Elsevier Patient Education  [place].

## 2022-07-12 LAB — CERVICOVAGINAL ANCILLARY ONLY
Bacterial Vaginitis (gardnerella): POSITIVE — AB
Candida Glabrata: NEGATIVE
Candida Vaginitis: NEGATIVE
Comment: NEGATIVE
Comment: NEGATIVE
Comment: NEGATIVE

## 2022-08-01 NOTE — Progress Notes (Unsigned)
    OBSTETRICS/GYNECOLOGY POST-OPERATIVE CLINIC VISIT  Subjective:     Nicole Roman is a 47 y.o. female who presents to the clinic 8 weeks status post  laparoscopic assisted vaginal hysterectomy with bilateral salpingectomy for abnormal uterine bleeding, fibroid uterus, and iron deficiency anemia . Eating a regular diet {with-without:5700} difficulty. Bowel movements are {normal/abnormal***:19619}. {pain control:13522::"The patient is not having any pain."}  {Common ambulatory SmartLinks:19316}  Review of Systems Pertinent items are noted in HPI.   Objective:   There were no vitals taken for this visit. There is no height or weight on file to calculate BMI.  General:  alert and no distress  Abdomen: soft, bowel sounds active, non-tender  Incision:   healing well, no drainage, no erythema, no hernia, no seroma, no swelling, no dehiscence, incision well approximated    Pathology:    Assessment:   Patient s/p laparoscopic assisted vaginal hysterectomy with bilateral salpingectomy (surgery)  Doing well postoperatively.   Plan:   1. Continue any current medications as instructed by provider. 2. Wound care discussed. 3. Operative findings again reviewed. Pathology report discussed. 4. Activity restrictions: {restrictions:13723} 5. Anticipated return to work: {work return:14002}. 6. Follow up: {2-87:86767} {time; units:18646} for ***    Rubie Maid, MD [place]

## 2022-08-02 ENCOUNTER — Encounter: Payer: Self-pay | Admitting: Obstetrics and Gynecology

## 2022-08-02 ENCOUNTER — Ambulatory Visit: Payer: Managed Care, Other (non HMO) | Admitting: Obstetrics and Gynecology

## 2022-08-02 VITALS — BP 161/85 | HR 78 | Resp 16 | Ht 64.0 in | Wt 155.6 lb

## 2022-08-02 DIAGNOSIS — I10 Essential (primary) hypertension: Secondary | ICD-10-CM

## 2022-08-02 DIAGNOSIS — Z4889 Encounter for other specified surgical aftercare: Secondary | ICD-10-CM

## 2022-08-02 DIAGNOSIS — Z90710 Acquired absence of both cervix and uterus: Secondary | ICD-10-CM

## 2022-08-02 DIAGNOSIS — D500 Iron deficiency anemia secondary to blood loss (chronic): Secondary | ICD-10-CM

## 2022-08-03 LAB — HEMOGLOBIN AND HEMATOCRIT, BLOOD
Hematocrit: 35.6 % (ref 34.0–46.6)
Hemoglobin: 10.6 g/dL — ABNORMAL LOW (ref 11.1–15.9)

## 2022-08-15 NOTE — Progress Notes (Unsigned)
Name: Nicole Roman   MRN: [id number]    DOB: [birth year]   Date:08/16/2022       Progress Note  Subjective  Chief Complaint  Blood Pressure  HPI  Patient had multiple BP readings that were above normal at GYN office. She denies headache, chest pain, palpitation or decrease in exercise tolerance. She states her mother has a BP machine at home but she has never checked. 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 Aretha Parrot with [facility] in [place] N/A 01/15/2020   Procedure: COLONOSCOPY WITH PROPOFOL;  Surgeon: Virgel Manifold, MD;  Location: [facility];  Service: Gastroenterology;  Laterality: N/A;   LAPAROSCOPIC VAGINAL HYSTERECTOMY WITH SALPINGECTOMY Bilateral 06/06/2022   Procedure: LAPAROSCOPIC ASSISTED VAGINAL HYSTERECTOMY WITH SALPINGECTOMY;  Surgeon: Rubie Maid, MD;  Location: [facility] ORS;  Service: Gynecology;  Laterality: Bilateral;   LEEP  2002   tummy tuck  07/30/2017   Dr. Aretha Parrot with [facility] in [place]    Family History  Problem Relation Age of Onset   Cancer Mother        colon   Hypertension Mother    Diabetes Father    Breast cancer Neg Hx     Social History   Tobacco Use   Smoking status: Never   Smokeless tobacco: Never  Substance Use Topics   Alcohol use: Yes    Comment: occasionally    No current outpatient  medications on file.  Allergies  Allergen Reactions   Diflucan [Fluconazole] Rash   Tetanus Toxoids Rash    I personally reviewed active problem list, medication list, allergies, family history, social history, health maintenance with the patient/caregiver today.   ROS  Ten systems reviewed and is negative except as mentioned in HPI   Objective  Vitals:   08/16/22 1343 08/16/22 1355 08/16/22 1356  BP: 134/82 (!) 142/82 138/82  Pulse: 93    Resp: 16    SpO2: 99%    Weight: 156 lb (70.8 kg)    Height: '5\' 4"'$  (1.626 m)      Body mass index is 26.78 kg/m.  Physical Exam  Constitutional: Patient appears well-developed and well-nourished.  No distress.  HEENT: head atraumatic, normocephalic, pupils equal and reactive to light, neck supple Cardiovascular: Normal rate, regular rhythm and normal heart sounds.  No murmur heard. No BLE edema. Pulmonary/Chest: Effort normal and breath sounds normal. No respiratory distress. Abdominal: Soft.  There is no tenderness. Psychiatric: Patient has a normal mood and affect. behavior is normal. Judgment and thought content normal.    PHQ2/9:    08/16/2022    1:42 PM 06/03/2022   10:30 AM 04/22/2022   10:02 AM 11/30/2021   11:03 AM 07/09/2021   11:29 AM  Depression screen PHQ 2/9  Decreased Interest 0 0 0 0 0  Down, Depressed, Hopeless 0  0 0 0 0  PHQ - 2 Score 0 0 0 0 0  Altered sleeping 0 0  1   Tired, decreased energy 0 0  0   Change in appetite 0 0  0   Feeling bad or failure about yourself  0 0  0   Trouble concentrating 0 0  0   Moving slowly or fidgety/restless 0 0  0   Suicidal thoughts 0 0  0   PHQ-9 Score 0 0  1     phq 9 is negative   Fall Risk:    08/16/2022    1:42 PM 08/02/2022    2:12 PM 06/03/2022   10:30 AM 04/22/2022   10:02 AM 04/12/2022    2:07 PM  Fall Risk   Falls in the past year? 0 0 0 0 0  Number falls in past yr: 0 0  0 0  Injury with Fall? 0 0  0 0  Risk for fall due to : No Fall Risks No Fall  Risks No Fall Risks No Fall Risks No Fall Risks  Follow up Falls prevention discussed  Falls prevention discussed;Education provided;Falls evaluation completed Falls evaluation completed Falls evaluation completed      Functional Status Survey: Is the patient deaf or have difficulty hearing?: No Does the patient have difficulty seeing, even when wearing glasses/contacts?: No Does the patient have difficulty concentrating, remembering, or making decisions?: No Does the patient have difficulty walking or climbing stairs?: No Does the patient have difficulty dressing or bathing?: No Does the patient have difficulty doing errands alone such as visiting a doctor's office or shopping?: No    Assessment & Plan  1. Elevated blood pressure reading  She will follow DASH diet, resume physical activity and monitor BP at home at least twice a week and bring a log during next visit   Also advised nurse visit with bp check twice a month in our office   She prefers not starting on medications at this time.

## 2022-08-16 ENCOUNTER — Ambulatory Visit: Payer: Managed Care, Other (non HMO) | Admitting: Family Medicine

## 2022-08-16 ENCOUNTER — Encounter: Payer: Self-pay | Admitting: Family Medicine

## 2022-08-16 VITALS — BP 138/82 | HR 93 | Resp 16 | Ht 64.0 in | Wt 156.0 lb

## 2022-08-16 DIAGNOSIS — R030 Elevated blood-pressure reading, without diagnosis of hypertension: Secondary | ICD-10-CM | POA: Diagnosis not present

## 2022-08-16 NOTE — Patient Instructions (Signed)

## 2022-11-08 ENCOUNTER — Ambulatory Visit: Payer: Managed Care, Other (non HMO) | Admitting: Family Medicine

## 2022-11-14 ENCOUNTER — Ambulatory Visit: Payer: Managed Care, Other (non HMO) | Admitting: Family Medicine

## 2022-12-05 NOTE — Patient Instructions (Signed)
Preventive Care 40-48 Years Old, Female Preventive care refers to lifestyle choices and visits with your health care provider that can promote health and wellness. Preventive care visits are also called wellness exams. What can I expect for my preventive care visit? Counseling Your health care provider may ask you questions about your: Medical history, including: Past medical problems. Family medical history. Pregnancy history. Current health, including: Menstrual cycle. Method of birth control. Emotional well-being. Home life and relationship well-being. Sexual activity and sexual health. Lifestyle, including: Alcohol, nicotine or tobacco, and drug use. Access to firearms. Diet, exercise, and sleep habits. Work and work environment. Sunscreen use. Safety issues such as seatbelt and bike helmet use. Physical exam Your health care provider will check your: Height and weight. These may be used to calculate your BMI (body mass index). BMI is a measurement that tells if you are at a healthy weight. Waist circumference. This measures the distance around your waistline. This measurement also tells if you are at a healthy weight and may help predict your risk of certain diseases, such as type 2 diabetes and high blood pressure. Heart rate and blood pressure. Body temperature. Skin for abnormal spots. What immunizations do I need?  Vaccines are usually given at various ages, according to a schedule. Your health care provider will recommend vaccines for you based on your age, medical history, and lifestyle or other factors, such as travel or where you work. What tests do I need? Screening Your health care provider may recommend screening tests for certain conditions. This may include: Lipid and cholesterol levels. Diabetes screening. This is done by checking your blood sugar (glucose) after you have not eaten for a while (fasting). Pelvic exam and Pap test. Hepatitis B test. Hepatitis C  test. HIV (human immunodeficiency virus) test. STI (sexually transmitted infection) testing, if you are at risk. Lung cancer screening. Colorectal cancer screening. Mammogram. Talk with your health care provider about when you should start having regular mammograms. This may depend on whether you have a family history of breast cancer. BRCA-related cancer screening. This may be done if you have a family history of breast, ovarian, tubal, or peritoneal cancers. Bone density scan. This is done to screen for osteoporosis. Talk with your health care provider about your test results, treatment options, and if necessary, the need for more tests. Follow these instructions at home: Eating and drinking  Eat a diet that includes fresh fruits and vegetables, whole grains, lean protein, and low-fat dairy products. Take vitamin and mineral supplements as recommended by your health care provider. Do not drink alcohol if: Your health care provider tells you not to drink. You are pregnant, may be pregnant, or are planning to become pregnant. If you drink alcohol: Limit how much you have to 0-1 drink a day. Know how much alcohol is in your drink. In the [place] one drink equals one 12 oz bottle of beer (355 mL), one 5 oz glass of wine (148 mL), or one 1 oz glass of hard liquor (44 mL). Lifestyle Brush your teeth every morning and night with fluoride toothpaste. Floss one time each day. Exercise for at least 30 minutes 5 or more days each week. Do not use any products that contain nicotine or tobacco. These products include cigarettes, chewing tobacco, and vaping devices, such as e-cigarettes. If you need help quitting, ask your health care provider. Do not use drugs. If you are sexually active, practice safe sex. Use a condom or other form of protection to   prevent STIs. If you do not wish to become pregnant, use a form of birth control. If you plan to become pregnant, see your health care provider for a  prepregnancy visit. Take aspirin only as told by your health care provider. Make sure that you understand how much to take and what form to take. Work with your health care provider to find out whether it is safe and beneficial for you to take aspirin daily. Find healthy ways to manage stress, such as: Meditation, yoga, or listening to music. Journaling. Talking to a trusted person. Spending time with friends and family. Minimize exposure to UV radiation to reduce your risk of skin cancer. Safety Always wear your seat belt while driving or riding in a vehicle. Do not drive: If you have been drinking alcohol. Do not ride with someone who has been drinking. When you are tired or distracted. While texting. If you have been using any mind-altering substances or drugs. Wear a helmet and other protective equipment during sports activities. If you have firearms in your house, make sure you follow all gun safety procedures. Seek help if you have been physically or sexually abused. What's next? Visit your health care provider once a year for an annual wellness visit. Ask your health care provider how often you should have your eyes and teeth checked. Stay up to date on all vaccines. This information is not intended to replace advice given to you by your health care provider. Make sure you discuss any questions you have with your health care provider. Document Revised: 12/30/2020 Document Reviewed: 12/30/2020 Elsevier Patient Education  2023 Elsevier Inc.  

## 2022-12-05 NOTE — Progress Notes (Unsigned)
Name: Nicole Roman   MRN: [id number]    DOB: 1976/07/11   Date:12/06/2022       Progress Note  Subjective  Chief Complaint  Annual Exam  HPI  Patient presents for annual CPE.  Diet: balanced  Exercise: continue regular physical activity   Last Eye Exam: she is due for an exam  Last Dental Exam: scheduled for June   Flowsheet Row Office Visit from 12/06/2022 in [facility]  AUDIT-C Score 2      Depression: Phq 9 is  negative    12/06/2022   10:03 AM 08/16/2022    1:42 PM 06/03/2022   10:30 AM 04/22/2022   10:02 AM 11/30/2021   11:03 AM  Depression screen PHQ 2/9  Decreased Interest 0 0 0 0 0  Down, Depressed, Hopeless 0 0 0 0 0  PHQ - 2 Score 0 0 0 0 0  Altered sleeping 0 0 0  1  Tired, decreased energy 0 0 0  0  Change in appetite 0 0 0  0  Feeling bad or failure about yourself  0 0 0  0  Trouble concentrating 0 0 0  0  Moving slowly or fidgety/restless 0 0 0  0  Suicidal thoughts 0 0 0  0  PHQ-9 Score 0 0 0  1   Hypertension: BP Readings from Last 3 Encounters:  12/06/22 130/76  08/16/22 138/82  08/02/22 (!) 161/85   Obesity: Wt Readings from Last 3 Encounters:  12/06/22 158 lb (71.7 kg)  08/16/22 156 lb (70.8 kg)  08/02/22 155 lb 9.6 oz (70.6 kg)   BMI Readings from Last 3 Encounters:  12/06/22 27.12 kg/m  08/16/22 26.78 kg/m  08/02/22 26.71 kg/m     Vaccines:    Tdap: up to date Shingrix: N/A Pneumonia: N/A Flu: 2018 COVID-19: up to date   Hep C Screening: 09/11/19 STD testing and prevention (HIV/chl/gon/syphilis): 08/31/16 Intimate partner violence: negative screen  Sexual History : one partner  Menstrual History/LMP/Abnormal Bleeding: s/p hysterectomy  Discussed importance of follow up if any post-menopausal bleeding: N/A Incontinence Symptoms: negative for symptoms   Breast cancer:  - Last Mammogram: 02/16/22 due this Summer  - BRCA gene screening: N/A  Osteoporosis Prevention : Discussed high calcium  and vitamin D supplementation, weight bearing exercises Bone density: N/A  Cervical cancer screening: 04/12/22 - no longer indicated since she had a hysterectomy   Skin cancer: Discussed monitoring for atypical lesions  Colorectal cancer: 01/15/20   Lung cancer:  Low Dose CT Chest recommended if Age 67-80 years, 20 pack-year currently smoking OR have quit w/in 15years. Patient does not qualify for screen   ECG: 07/03/17  Advanced Care Planning: A voluntary discussion about advance care planning including the explanation and discussion of advance directives.  Discussed health care proxy and Living will, and the patient was able to identify a health care proxy as sister Lenward Chancellor .  Patient does not have a living will and power of attorney of health care   Lipids: Lab Results  Component Value Date   CHOL 149 11/27/2020   CHOL 160 09/11/2019   CHOL 142 08/11/2018   Lab Results  Component Value Date   HDL 52 11/27/2020   HDL 52 09/11/2019   HDL 52 08/11/2018   Lab Results  Component Value Date   LDLCALC 90 11/27/2020   LDLCALC 98 09/11/2019   LDLCALC 83 08/11/2018   Lab Results  Component Value Date  TRIG 25 11/27/2020   TRIG 35 09/11/2019   TRIG 36 (A) 08/11/2018   Lab Results  Component Value Date   CHOLHDL 2.9 11/27/2020   CHOLHDL 3.1 09/11/2019   CHOLHDL 3.4 10/04/2017   No results found for: "LDLDIRECT"  Glucose: Glucose, Bld  Date Value Ref Range Status  04/20/2022 98 70 - 99 mg/dL Final    Comment:    Glucose reference range applies only to samples taken after fasting for at least 8 hours.  11/27/2020 96 65 - 99 mg/dL Final    Comment:    .            Fasting reference interval .   06/19/2019 84 65 - 99 mg/dL Final    Comment:    .            Fasting reference interval .     Patient Active Problem List   Diagnosis Date Noted  . S/P hysterectomy 06/06/2022  . Iron deficiency anemia due to chronic blood loss 05/25/2022  . FDE (fixed drug  eruption) 11/30/2021  . Pain of left hand 10/11/2021  . Family history of colon cancer in mother   . Polyp of colon   . B12 deficiency 08/14/2018  . Goiter 02/08/2016    Past Surgical History:  Procedure Laterality Date  . AUGMENTATION MAMMAPLASTY Bilateral 2019  . 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 Lysbeth Penner with [facility] in [place]  . COLONOSCOPY WITH PROPOFOL N/A 01/15/2020   Procedure: COLONOSCOPY WITH PROPOFOL;  Surgeon: Pasty Spillers, MD;  Location: [facility] END[facility];  Service: Gastroenterology;  Laterality: N/A;  . LAPAROSCOPIC VAGINAL HYSTERECTOMY WITH SALPINGECTOMY Bilateral 06/06/2022   Procedure: LAPAROSCOPIC ASSISTED VAGINAL HYSTERECTOMY WITH SALPINGECTOMY;  Surgeon: Hildred Laser, MD;  Location: [facility] ORS;  Service: Gynecology;  Laterality: Bilateral;  . LEEP  2002  . tummy tuck  07/30/2017   Dr. Lysbeth Penner with [facility] in [facility]    Family History  Problem Relation Age of Onset  . Cancer Mother        colon  . Hypertension Mother   . Diabetes Father   . Breast cancer Neg Hx     Social History   Socioeconomic History  . Marital status: Divorced    Spouse name: Not on file  . Number of children: 2  . Years of education: Not on file  . Highest education level: Some college, no degree  Occupational History  . Not on file  Tobacco Use  . Smoking status: Never  . Smokeless tobacco: Never  Vaping Use  . Vaping Use: Never used  Substance and Sexual Activity  . Alcohol use: Yes    Comment: occasionally  . Drug use: No  . Sexual activity: Yes    Partners: Male    Birth control/protection: I.U.D.  Other Topics Concern  . Not on file  Social History Narrative   Patient is originally from [place]   Dating for many years   Social Determinants of Health   Financial Resource Strain: Low Risk  (12/06/2022)   Overall Financial Resource Strain (CARDIA)   . Difficulty of Paying Living Expenses: Not very hard  Food Insecurity: No Food  Insecurity (12/06/2022)   Hunger Vital Sign   . Worried About Programme researcher, broadcasting/film/video in the Last Year: Never true   . Ran Out of Food in the Last Year: Never true  Transportation Needs: No Transportation Needs (12/06/2022)   PRAPARE - Transportation   . Lack of Transportation (Medical): No   .  Lack of Transportation (Non-Medical): No  Physical Activity: Sufficiently Active (12/06/2022)   Exercise Vital Sign   . Days of Exercise per Week: 3 days   . Minutes of Exercise per Session: 50 min  Stress: No Stress Concern Present (12/06/2022)   [employer] of Occupational Health - Occupational Stress Questionnaire   . Feeling of Stress : Only a little  Social Connections: Moderately Isolated (12/06/2022)   Social Connection and Isolation Panel [NHANES]   . Frequency of Communication with Friends and Family: More than three times a week   . Frequency of Social Gatherings with Friends and Family: Once a week   . Attends Religious Services: 1 to 4 times per year   . Active Member of Clubs or Organizations: No   . Attends Banker Meetings: Never   . Marital Status: Divorced  Catering manager Violence: Not At Risk (12/06/2022)   Humiliation, Afraid, Rape, and Kick questionnaire   . Fear of Current or Ex-Partner: No   . Emotionally Abused: No   . Physically Abused: No   . Sexually Abused: No    No current outpatient medications on file.  Allergies  Allergen Reactions  . Diflucan [Fluconazole] Rash  . Tetanus Toxoids Rash     ROS  Constitutional: Negative for fever or weight change.  Respiratory: Negative for cough and shortness of breath.   Cardiovascular: Negative for chest pain or palpitations.  Gastrointestinal: Negative for abdominal pain, no bowel changes.  Musculoskeletal: Negative for gait problem or joint swelling.  Skin: Negative for rash.  Neurological: Negative for dizziness or headache.  No other specific complaints in a complete review of systems (except as  listed in HPI above).   Objective  Vitals:   12/06/22 1002  BP: 130/76  Pulse: 70  Resp: 14  Temp: 98.1 F (36.7 C)  TempSrc: Oral  SpO2: 100%  Weight: 158 lb (71.7 kg)  Height: 5\' 4"  (1.626 m)    Body mass index is 27.12 kg/m.  Physical Exam  Constitutional: Patient appears well-developed and well-nourished. No distress.  HENT: Head: Normocephalic and atraumatic. Ears: B TMs ok, no erythema or effusion; Nose: Nose normal. Mouth/Throat: Oropharynx is clear and moist. No oropharyngeal exudate. Thyromegaly  Eyes: Conjunctivae and EOM are normal. Pupils are equal, round, and reactive to light. No scleral icterus.  Neck: Normal range of motion. Neck supple. No JVD present. No thyromegaly present.  Cardiovascular: Normal rate, regular rhythm and normal heart sounds.  No murmur heard. No BLE edema. Pulmonary/Chest: Effort normal and breath sounds normal. No respiratory distress. Abdominal: Soft. Bowel sounds are normal, no distension. There is no tenderness. no masses Breast: no lumps or masses, no nipple discharge or rashes FEMALE GENITALIA:  Not done  RECTAL: not done  Musculoskeletal: Normal range of motion, no joint effusions. No gross deformities Neurological: he is alert and oriented to person, place, and time. No cranial nerve deficit. Coordination, balance, strength, speech and gait are normal.  Skin: Skin is warm and dry. No rash noted. No erythema.  Psychiatric: Patient has a normal mood and affect. behavior is normal. Judgment and thought content normal.    Fall Risk:    08/16/2022    1:42 PM 08/02/2022    2:12 PM 06/03/2022   10:30 AM 04/22/2022   10:02 AM 04/12/2022    2:07 PM  Fall Risk   Falls in the past year? 0 0 0 0 0  Number falls in past yr: 0 0  0 0  Injury with Fall? 0 0  0 0  Risk for fall due to : No Fall Risks No Fall Risks No Fall Risks No Fall Risks No Fall Risks  Follow up Falls prevention discussed  Falls prevention discussed;Education  provided;Falls evaluation completed Falls evaluation completed Falls evaluation completed     Functional Status Survey: Is the patient deaf or have difficulty hearing?: No Does the patient have difficulty seeing, even when wearing glasses/contacts?: No Does the patient have difficulty concentrating, remembering, or making decisions?: No Does the patient have difficulty walking or climbing stairs?: No Does the patient have difficulty dressing or bathing?: No Does the patient have difficulty doing errands alone such as visiting a doctor's office or shopping?: No   Assessment & Plan  1. Well adult exam  - Lipid panel - CBC with Differential/Platelet - Iron, TIBC and Ferritin Panel - Hemoglobin A1c - B12 and Folate Panel - Comprehensive metabolic panel - TSH  2. Family history of thyroid disease   3. Goiter  - TSH  4. B12 deficiency  - B12 and Folate Panel  5. Iron deficiency anemia due to chronic blood loss  - CBC with Differential/Platelet - Iron, TIBC and Ferritin Panel  6. Breast cancer screening by mammogram  - MM 3D SCREENING MAMMOGRAM BILATERAL BREAST; Future  7. Lipid screening  - Lipid panel  8. Diabetes mellitus screening  - Hemoglobin A1c   9. Need for hepatitis A vaccination  - Hepatitis A vaccine adult IM   -USPSTF grade A and B recommendations reviewed with patient; age-appropriate recommendations, preventive care, screening tests, etc discussed and encouraged; healthy living encouraged; see AVS for patient education given to patient -Discussed importance of 150 minutes of physical activity weekly, eat two servings of fish weekly, eat one serving of tree nuts ( cashews, pistachios, pecans, almonds.Marland Kitchen) every other day, eat 6 servings of fruit/vegetables daily and drink plenty of water and avoid sweet beverages.   -Reviewed Health Maintenance: Yes.

## 2022-12-06 ENCOUNTER — Ambulatory Visit (INDEPENDENT_AMBULATORY_CARE_PROVIDER_SITE_OTHER): Payer: Managed Care, Other (non HMO) | Admitting: Family Medicine

## 2022-12-06 ENCOUNTER — Encounter: Payer: Self-pay | Admitting: Family Medicine

## 2022-12-06 VITALS — BP 130/76 | HR 70 | Temp 98.1°F | Resp 14 | Ht 64.0 in | Wt 158.0 lb

## 2022-12-06 DIAGNOSIS — E049 Nontoxic goiter, unspecified: Secondary | ICD-10-CM | POA: Diagnosis not present

## 2022-12-06 DIAGNOSIS — Z13220 Encounter for screening for lipoid disorders: Secondary | ICD-10-CM

## 2022-12-06 DIAGNOSIS — Z23 Encounter for immunization: Secondary | ICD-10-CM

## 2022-12-06 DIAGNOSIS — Z0000 Encounter for general adult medical examination without abnormal findings: Secondary | ICD-10-CM

## 2022-12-06 DIAGNOSIS — Z1231 Encounter for screening mammogram for malignant neoplasm of breast: Secondary | ICD-10-CM

## 2022-12-06 DIAGNOSIS — E538 Deficiency of other specified B group vitamins: Secondary | ICD-10-CM

## 2022-12-06 DIAGNOSIS — D500 Iron deficiency anemia secondary to blood loss (chronic): Secondary | ICD-10-CM

## 2022-12-06 DIAGNOSIS — Z8349 Family history of other endocrine, nutritional and metabolic diseases: Secondary | ICD-10-CM

## 2022-12-06 DIAGNOSIS — Z131 Encounter for screening for diabetes mellitus: Secondary | ICD-10-CM

## 2022-12-16 LAB — CBC WITH DIFFERENTIAL/PLATELET
Basophils Absolute: 0 10*3/uL (ref 0.0–0.2)
Basos: 1 %
EOS (ABSOLUTE): 0.1 10*3/uL (ref 0.0–0.4)
Eos: 2 %
Hematocrit: 34.8 % (ref 34.0–46.6)
Hemoglobin: 11 g/dL — ABNORMAL LOW (ref 11.1–15.9)
Immature Grans (Abs): 0 10*3/uL (ref 0.0–0.1)
Immature Granulocytes: 0 %
Lymphocytes Absolute: 2.9 10*3/uL (ref 0.7–3.1)
Lymphs: 47 %
MCH: 26.4 pg — ABNORMAL LOW (ref 26.6–33.0)
MCHC: 31.6 g/dL (ref 31.5–35.7)
MCV: 84 fL (ref 79–97)
Monocytes Absolute: 0.5 10*3/uL (ref 0.1–0.9)
Monocytes: 8 %
Neutrophils Absolute: 2.5 10*3/uL (ref 1.4–7.0)
Neutrophils: 42 %
Platelets: 233 10*3/uL (ref 150–450)
RBC: 4.17 x10E6/uL (ref 3.77–5.28)
RDW: 13.7 % (ref 11.7–15.4)
WBC: 6.1 10*3/uL (ref 3.4–10.8)

## 2022-12-16 LAB — COMPREHENSIVE METABOLIC PANEL
ALT: 7 IU/L (ref 0–32)
AST: 13 IU/L (ref 0–40)
Albumin/Globulin Ratio: 1.3 (ref 1.2–2.2)
Albumin: 4.1 g/dL (ref 3.9–4.9)
Alkaline Phosphatase: 101 IU/L (ref 44–121)
BUN/Creatinine Ratio: 12 (ref 9–23)
BUN: 7 mg/dL (ref 6–24)
Bilirubin Total: 0.6 mg/dL (ref 0.0–1.2)
CO2: 22 mmol/L (ref 20–29)
Calcium: 9.1 mg/dL (ref 8.7–10.2)
Chloride: 103 mmol/L (ref 96–106)
Creatinine, Ser: 0.6 mg/dL (ref 0.57–1.00)
Globulin, Total: 3.1 g/dL (ref 1.5–4.5)
Glucose: 81 mg/dL (ref 70–99)
Potassium: 4.7 mmol/L (ref 3.5–5.2)
Sodium: 140 mmol/L (ref 134–144)
Total Protein: 7.2 g/dL (ref 6.0–8.5)
eGFR: 111 mL/min/{1.73_m2} (ref >59)

## 2022-12-16 LAB — B12 AND FOLATE PANEL
Folate: 14.6 ng/mL (ref >3.0)
Vitamin B-12: 347 pg/mL (ref 232–1245)

## 2022-12-16 LAB — TSH: TSH: 1.49 u[IU]/mL (ref 0.450–4.500)

## 2022-12-16 LAB — LIPID PANEL
Chol/HDL Ratio: 2.9 ratio (ref 0.0–4.4)
Cholesterol, Total: 151 mg/dL (ref 100–199)
HDL: 52 mg/dL (ref >39)
LDL Chol Calc (NIH): 92 mg/dL (ref 0–99)
Triglycerides: 29 mg/dL (ref 0–149)
VLDL Cholesterol Cal: 7 mg/dL (ref 5–40)

## 2022-12-16 LAB — HEMOGLOBIN A1C
Est. average glucose Bld gHb Est-mCnc: 117 mg/dL
Hgb A1c MFr Bld: 5.7 % — ABNORMAL HIGH (ref 4.8–5.6)

## 2022-12-16 LAB — IRON,TIBC AND FERRITIN PANEL
Ferritin: 8 ng/mL — ABNORMAL LOW (ref 15–150)
Iron Saturation: 9 % — CL (ref 15–55)
Iron: 37 ug/dL (ref 27–159)
Total Iron Binding Capacity: 430 ug/dL (ref 250–450)
UIBC: 393 ug/dL (ref 131–425)

## 2022-12-16 LAB — SPECIMEN STATUS REPORT

## 2023-01-26 ENCOUNTER — Ambulatory Visit
Admission: RE | Admit: 2023-01-26 | Discharge: 2023-01-26 | Disposition: A | Discharge status: Home / Self Care | Payer: Managed Care, Other (non HMO) | Source: Ambulatory Visit | Attending: Family Medicine | Admitting: Family Medicine

## 2023-01-26 DIAGNOSIS — Z1231 Encounter for screening mammogram for malignant neoplasm of breast: Secondary | ICD-10-CM | POA: Insufficient documentation

## 2023-01-31 ENCOUNTER — Other Ambulatory Visit: Payer: Self-pay | Admitting: Family Medicine

## 2023-01-31 ENCOUNTER — Encounter: Payer: Self-pay | Admitting: Family Medicine

## 2023-01-31 MED ORDER — CYANOCOBALAMIN 1000 MCG/ML IJ SOLN: 1000.0000 ug | INTRAMUSCULAR | 11 refills | Status: AC

## 2023-04-24 ENCOUNTER — Ambulatory Visit: Payer: Managed Care, Other (non HMO) | Admitting: Family Medicine

## 2023-05-24 ENCOUNTER — Emergency Department (HOSPITAL_BASED_OUTPATIENT_CLINIC_OR_DEPARTMENT_OTHER)
Admission: EM | Admit: 2023-05-24 | Discharge: 2023-05-24 | Disposition: A | Discharge status: Home / Self Care | Payer: Managed Care, Other (non HMO) | Attending: Emergency Medicine | Admitting: Emergency Medicine

## 2023-05-24 ENCOUNTER — Other Ambulatory Visit: Payer: Self-pay

## 2023-05-24 ENCOUNTER — Emergency Department (HOSPITAL_BASED_OUTPATIENT_CLINIC_OR_DEPARTMENT_OTHER): Payer: Managed Care, Other (non HMO)

## 2023-05-24 ENCOUNTER — Encounter (HOSPITAL_BASED_OUTPATIENT_CLINIC_OR_DEPARTMENT_OTHER): Payer: Self-pay

## 2023-05-24 DIAGNOSIS — R1032 Left lower quadrant pain: Secondary | ICD-10-CM | POA: Diagnosis not present

## 2023-05-24 DIAGNOSIS — R140 Abdominal distension (gaseous): Secondary | ICD-10-CM

## 2023-05-24 DIAGNOSIS — R109 Unspecified abdominal pain: Secondary | ICD-10-CM | POA: Diagnosis present

## 2023-05-24 LAB — URINALYSIS, ROUTINE W REFLEX MICROSCOPIC
Bilirubin Urine: NEGATIVE
Glucose, UA: NEGATIVE mg/dL
Hgb urine dipstick: NEGATIVE
Ketones, ur: NEGATIVE mg/dL
Leukocytes,Ua: NEGATIVE
Nitrite: NEGATIVE
Protein, ur: NEGATIVE mg/dL
Specific Gravity, Urine: 1.015 (ref 1.005–1.030)
pH: 6 (ref 5.0–8.0)

## 2023-05-24 LAB — CBC WITH DIFFERENTIAL/PLATELET
Abs Immature Granulocytes: 0.02 10*3/uL (ref 0.00–0.07)
Basophils Absolute: 0 10*3/uL (ref 0.0–0.1)
Basophils Relative: 0 %
Eosinophils Absolute: 0 10*3/uL (ref 0.0–0.5)
Eosinophils Relative: 0 %
HCT: 35.5 % — ABNORMAL LOW (ref 36.0–46.0)
Hemoglobin: 11.4 g/dL — ABNORMAL LOW (ref 12.0–15.0)
Immature Granulocytes: 0 %
Lymphocytes Relative: 31 %
Lymphs Abs: 2.2 10*3/uL (ref 0.7–4.0)
MCH: 27.7 pg (ref 26.0–34.0)
MCHC: 32.1 g/dL (ref 30.0–36.0)
MCV: 86.4 fL (ref 80.0–100.0)
Monocytes Absolute: 0.4 10*3/uL (ref 0.1–1.0)
Monocytes Relative: 5 %
Neutro Abs: 4.6 10*3/uL (ref 1.7–7.7)
Neutrophils Relative %: 64 %
Platelets: 233 10*3/uL (ref 150–400)
RBC: 4.11 MIL/uL (ref 3.87–5.11)
RDW: 14.2 % (ref 11.5–15.5)
WBC: 7.3 10*3/uL (ref 4.0–10.5)
nRBC: 0 % (ref 0.0–0.2)

## 2023-05-24 LAB — COMPREHENSIVE METABOLIC PANEL
ALT: 11 U/L (ref 0–44)
AST: 17 U/L (ref 15–41)
Albumin: 3.7 g/dL (ref 3.5–5.0)
Alkaline Phosphatase: 82 U/L (ref 38–126)
Anion gap: 9 (ref 5–15)
BUN: 7 mg/dL (ref 6–20)
CO2: 24 mmol/L (ref 22–32)
Calcium: 9.2 mg/dL (ref 8.9–10.3)
Chloride: 101 mmol/L (ref 98–111)
Creatinine, Ser: 0.55 mg/dL (ref 0.44–1.00)
GFR, Estimated: >60 mL/min (ref >60)
Glucose, Bld: 145 mg/dL — ABNORMAL HIGH (ref 70–99)
Potassium: 3.3 mmol/L — ABNORMAL LOW (ref 3.5–5.1)
Sodium: 134 mmol/L — ABNORMAL LOW (ref 135–145)
Total Bilirubin: 1.1 mg/dL (ref <1.2)
Total Protein: 7.3 g/dL (ref 6.5–8.1)

## 2023-05-24 LAB — HEMOGLOBIN A1C: A1c: 5.6

## 2023-05-24 LAB — CMP 10231: EGFR: 111

## 2023-05-24 LAB — LIPASE, BLOOD: Lipase: 24 U/L (ref 11–51)

## 2023-05-24 MED ORDER — MORPHINE SULFATE (PF) 4 MG/ML IV SOLN
4.0000 mg | Freq: Once | INTRAVENOUS | Status: DC
  Filled 2023-05-24: qty 1

## 2023-05-24 MED ORDER — ACETAMINOPHEN 500 MG PO TABS
1000.0000 mg | ORAL_TABLET | Freq: Once | ORAL | Status: AC
  Administered 2023-05-24: 1000 mg via ORAL
  Filled 2023-05-24: qty 2

## 2023-05-24 MED ORDER — SIMETHICONE 125 MG PO CHEW: 125.0000 mg | CHEWABLE_TABLET | Freq: Four times a day (QID) | ORAL | 0 refills | Status: DC | PRN

## 2023-05-24 NOTE — ED Provider Notes (Signed)
[place] [facility] Provider Note   CSN: [id number] Arrival date & time: 05/24/23  1819     History  Chief Complaint  Patient presents with   Abdominal Pain    Nicole Roman is a 47 y.o. female.  The history is provided by the patient and medical records. No language interpreter was used.  Abdominal Pain   47 year old female significant history of iron deficiency anemia, prior hysterectomy, family history of diverticular disease presenting with complaint of abdominal pain.  Patient noted sharp pain to her left lower abdomen that started earlier today.  Pain was intense and she tries Gas-X without relief.  She did not endorse any fever or chills no lightheadedness or dizziness no nausea vomiting diarrhea constipation no urinary discomfort.  She mention her family has a strong history of diverticulitis which concerns her.  At this time she mention her pain did improve.  Home Medications Prior to Admission medications   Medication Sig Start Date End Date Taking? Authorizing Provider  cyanocobalamin (VITAMIN B12) 1000 MCG/ML injection Inject 1 mL (1,000 mcg total) into the muscle every 30 (thirty) days. 01/31/23   Alba Cory, MD      Allergies    Diflucan [fluconazole] and Tetanus toxoids    Review of Systems   Review of Systems  Gastrointestinal:  Positive for abdominal pain.  All other systems reviewed and are negative.   Physical Exam Updated Vital Signs BP 124/71   Pulse 68   Temp 99.1 F (37.3 C) (Oral)   Resp 20   Ht 5\' 4"  (1.626 m)   Wt 71.2 kg   LMP  (LMP Unknown)   SpO2 91%   BMI 26.95 kg/m  Physical Exam Vitals and nursing note reviewed.  Constitutional:      General: She is not in acute distress.    Appearance: She is well-developed.  HENT:     Head: Atraumatic.  Eyes:     Conjunctiva/sclera: Conjunctivae normal.  Cardiovascular:     Rate and Rhythm: Normal rate and regular rhythm.  Pulmonary:      Effort: Pulmonary effort is normal.  Abdominal:     General: Abdomen is flat. Bowel sounds are normal.     Palpations: Abdomen is soft.     Tenderness: There is abdominal tenderness in the left lower quadrant. There is no right CVA tenderness or left CVA tenderness.  Musculoskeletal:     Cervical back: Neck supple.  Skin:    Findings: No rash.  Neurological:     Mental Status: She is alert.  Psychiatric:        Mood and Affect: Mood normal.     ED Results / Procedures / Treatments   Labs (all labs ordered are listed, but only abnormal results are displayed) Labs Reviewed  CBC WITH DIFFERENTIAL/PLATELET - Abnormal; Notable for the following components:      Result Value   Hemoglobin 11.4 (*)    HCT 35.5 (*)    All other components within normal limits  COMPREHENSIVE METABOLIC PANEL - Abnormal; Notable for the following components:   Sodium 134 (*)    Potassium 3.3 (*)    Glucose, Bld 145 (*)    All other components within normal limits  LIPASE, BLOOD  URINALYSIS, ROUTINE W REFLEX MICROSCOPIC    EKG None  Radiology CT RENAL STONE STUDY  Result Date: 05/24/2023 CLINICAL DATA:  Abdominal pain, flank pain, left lower quadrant pain EXAM: CT ABDOMEN AND PELVIS  WITHOUT CONTRAST TECHNIQUE: Multidetector CT imaging of the abdomen and pelvis was performed following the standard protocol without IV contrast. RADIATION DOSE REDUCTION: This exam was performed according to the departmental dose-optimization program which includes automated exposure control, adjustment of the mA and/or kV according to patient size and/or use of iterative reconstruction technique. COMPARISON:  None Available. FINDINGS: Lower chest: No acute abnormality Hepatobiliary: No focal hepatic abnormality. Gallbladder unremarkable. Pancreas: No focal abnormality or ductal dilatation. Spleen: No focal abnormality.  Normal size. Adrenals/Urinary Tract: No adrenal abnormality. No focal renal abnormality. No stones or  hydronephrosis. Urinary bladder is unremarkable. Locules of gas in the urinary bladder, presumably from recent catheterization. Stomach/Bowel: Stomach, large and small bowel grossly unremarkable. Vascular/Lymphatic: No evidence of aneurysm or adenopathy. Reproductive: Uterus and adnexa unremarkable.  No mass. Other: No free fluid or free air. Musculoskeletal: No acute bony abnormality. IMPRESSION: No renal or ureteral stones.  No hydronephrosis. No acute findings. Electronically Signed   By: Charlett Nose M.D.   On: 05/24/2023 20:35    Procedures Procedures    Medications Ordered in ED Medications  acetaminophen (TYLENOL) tablet 1,000 mg (1,000 mg Oral Given 05/24/23 2216)    ED Course/ Medical Decision Making/ A&P                                 Medical Decision Making  BP 124/71   Pulse 68   Temp 99.1 F (37.3 C) (Oral)   Resp 20   Ht 5\' 4"  (1.626 m)   Wt 71.2 kg   LMP  (LMP Unknown)   SpO2 91%   BMI 26.95 kg/m   46:7 PM  47 year old female significant history of iron deficiency anemia, prior hysterectomy, family history of diverticular disease presenting with complaint of abdominal pain.  Patient noted sharp pain to her left lower abdomen that started earlier today.  Pain was intense and she tries Gas-X without relief.  She did not endorse any fever or chills no lightheadedness or dizziness no nausea vomiting diarrhea constipation no urinary discomfort.  She mention her family has a strong history of diverticulitis which concerns her.  At this time she mention her pain did improve.  Exam overall reassuring mild left lower quadrant tenderness no guarding no rebound tenderness bowel sounds present.  -Labs ordered, independently viewed and interpreted by me.  Labs remarkable for labs are reassuring -The patient was maintained on a cardiac monitor.  I personally viewed and interpreted the cardiac monitored which showed an underlying rhythm of: NSR -Imaging independently viewed and  interpreted by me and I agree with radiologist's interpretation.  Result remarkable for abd/pelvis CT without concerning finding -This patient presents to the ED for concern of abd pain, this involves an extensive number of treatment options, and is a complaint that carries with it a high risk of complications and morbidity.  The differential diagnosis includes gas pain, diverticulitis, colitis, pancreatitis, UTI, kidney stone, appendicitis -Co morbidities that complicate the patient evaluation includes anemia -Treatment includes reassurance -Reevaluation of the patient after these medicines showed that the patient improved -PCP office notes or outside notes reviewed -Escalation to admission/observation considered: patients feels much better, is comfortable with discharge, and will follow up with PCP -Prescription medication considered, patient comfortable with OTC meds -Social Determinant of Health considered            Final Clinical Impression(s) / ED Diagnoses Final diagnoses:  LLQ pain  Bloating symptom    Rx / DC Orders ED Discharge Orders          Ordered    simethicone (MYLICON) 125 MG chewable tablet  Every 6 hours PRN        05/24/23 2237              Fayrene Helper, PA-C 05/27/23 2232    Royanne Foots, DO 05/30/23 1154

## 2023-05-24 NOTE — ED Notes (Signed)

## 2023-05-24 NOTE — Discharge Instructions (Signed)
Your abdominal pain is likely due to gas pain.  You may take simethicone as needed for bloatedness.  Please follow-up with your doctor for further care.  Return if you have any concern.  CT scan today did not show any concerning finding.

## 2023-05-24 NOTE — ED Provider Triage Note (Signed)
Emergency Medicine Provider Triage Evaluation Note  Nicole Roman , a 47 y.o. female  was evaluated in triage.  Pt complains of LLQ abd pain with bloating. Pain radiates to left lower back. Prior total hysterectomy Review of Systems  Positive:  Negative: Fevers, chills, nausea, vomiting, changes in bowel or bladder habits  Physical Exam  BP 122/75 (BP Location: Right Arm)   Pulse 86   Temp 99.1 F (37.3 C) (Oral)   Resp 18   Ht 5\' 4"  (1.626 m)   Wt 71.2 kg   LMP  (LMP Unknown)   SpO2 100%   BMI 26.95 kg/m  Gen:   Awake, no distress   Resp:  Normal effort  MSK:   Moves extremities without difficulty  Other:    Medical Decision Making  Medically screening exam initiated at 6:29 PM.  Appropriate orders placed.  Nicole Roman was informed that the remainder of the evaluation will be completed by another provider, this initial triage assessment does not replace that evaluation, and the importance of remaining in the ED until their evaluation is complete.     Nicole Fend, PA-C 05/24/23 1830

## 2023-05-24 NOTE — ED Triage Notes (Signed)
Pt reports LLQ abdominal pain that started today associated with abd tenderness and swelling. Denies n/v/d.

## 2023-06-02 NOTE — Progress Notes (Unsigned)
Name: Nicole Roman   MRN: [id number]    DOB: 09/16/1975   Date:06/05/2023       Progress Note  Subjective  Chief Complaint  Follow Up  HPI  Goiter : she had a biopsy in the past around 2013, last US done in 2019 showed a nodule but did not require a biopsy, TSH has been normal . Unchanged   Iron deficiency anemia she used to have heavy cycles but had a hysterectomy done in Nov 2023 , last hemoglobin still low but improving She is not taking iron supplements because it causes constipation.   HTN: she states employee clinic gave her a medication about one year ago , she is taking norvasc 5 mg daily and denies side effects. 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 Lysbeth Penner with [facility] in [place]   COLONOSCOPY WITH PROPOFOL N/A 01/15/2020   Procedure: COLONOSCOPY WITH PROPOFOL;  Surgeon: Pasty Spillers, MD;  Location: [facility] END[facility]COPY;  Service: Gastroenterology;  Laterality: N/A;   LAPAROSCOPIC VAGINAL HYSTERECTOMY WITH SALPINGECTOMY Bilateral 06/06/2022   Procedure: LAPAROSCOPIC ASSISTED VAGINAL HYSTERECTOMY WITH SALPINGECTOMY;  Surgeon: Hildred Laser, MD;  Location: [facility] ORS;  Service: Gynecology;  Laterality:  Bilateral;   LEEP  2002   tummy tuck  07/30/2017   Dr. Lysbeth Penner with [facility] in [place]    Family History  Problem Relation Age of Onset   Cancer Mother        colon   Hypertension Mother    Diabetes Father    Breast cancer Neg Hx     Social History   Tobacco Use   Smoking status: Never   Smokeless tobacco: Never  Substance Use Topics   Alcohol use: Yes    Comment: occasionally     Current Outpatient Medications:    cyanocobalamin (VITAMIN B12) 1000 MCG/ML injection, Inject 1 mL (1,000 mcg total) into the muscle every 30 (thirty) days., Disp: 1 mL, Rfl: 11   simethicone (MYLICON) 125 MG chewable tablet, Chew 1 tablet (125 mg total) by mouth every 6 (six) hours as needed for flatulence., Disp: 30 tablet, Rfl: 0  Allergies  Allergen Reactions   Diflucan [Fluconazole] Rash   Tetanus Toxoids Rash    I personally reviewed active problem list, medication list, allergies, family history, social history, health maintenance with the patient/caregiver today.   ROS  Ten systems reviewed and is negative except as mentioned in HPI    Objective  Vitals:   06/05/23 1545  BP: 126/72  Pulse: 75  Resp: 14  Temp: 98 F (36.7 C)  TempSrc: Oral  SpO2: 100%  Weight:  157 lb 1.6 oz (71.3 kg)  Height: 5\' 4"  (1.626 m)    Body mass index is 26.97 kg/m.  Physical Exam  Constitutional: Patient appears well-developed and well-nourished.  No distress.  HEENT: head atraumatic, normocephalic, pupils equal and reactive to light, neck supple Cardiovascular: Normal rate, regular rhythm and normal heart sounds.  No murmur heard. No BLE edema. Pulmonary/Chest: Effort normal and breath sounds normal. No respiratory distress. Abdominal: Soft.  There is no tenderness. Psychiatric: Patient has a normal mood and affect. behavior is normal. Judgment and thought content normal.    PHQ2/9:    06/05/2023    3:46 PM 12/06/2022   10:03 AM 08/16/2022    1:42 PM 06/03/2022   10:30 AM  04/22/2022   10:02 AM  Depression screen PHQ 2/9  Decreased Interest 0 0 0 0 0  Down, Depressed, Hopeless 0 0 0 0 0  PHQ - 2 Score 0 0 0 0 0  Altered sleeping 0 0 0 0   Tired, decreased energy 0 0 0 0   Change in appetite 0 0 0 0   Feeling bad or failure about yourself  0 0 0 0   Trouble concentrating 0 0 0 0   Moving slowly or fidgety/restless 0 0 0 0   Suicidal thoughts 0 0 0 0   PHQ-9 Score 0 0 0 0     phq 9 is negative   Fall Risk:    06/05/2023    3:46 PM 08/16/2022    1:42 PM 08/02/2022    2:12 PM 06/03/2022   10:30 AM 04/22/2022   10:02 AM  Fall Risk   Falls in the past year? 0 0 0 0 0  Number falls in past yr:  0 0  0  Injury with Fall?  0 0  0  Risk for fall due to : No Fall Risks No Fall Risks No Fall Risks No Fall Risks No Fall Risks  Follow up Falls prevention discussed Falls prevention discussed  Falls prevention discussed;Education provided;Falls evaluation completed Falls evaluation completed      Functional Status Survey: Is the patient deaf or have difficulty hearing?: No Does the patient have difficulty seeing, even when wearing glasses/contacts?: No Does the patient have difficulty concentrating, remembering, or making decisions?: No Does the patient have difficulty walking or climbing stairs?: No Does the patient have difficulty dressing or bathing?: No Does the patient have difficulty doing errands alone such as visiting a doctor's office or shopping?: No    Assessment & Plan  1. Iron deficiency anemia due to chronic blood loss  - Ambulatory referral to Gastroenterology  Advised her to take ferrous sulfate 325 mg daily and add colace 100 mg to avoid constipation   2. B12 deficiency  Continue injections monthly and add SL the week before a shot  3. Goiter  Evaluated with biopsy in the past   4. Family history of colon cancer in mother  - Ambulatory referral to Gastroenterology  5. Hypertension, benign   She is taking norvasc 5 mg  daily and is tolerating it well  EKG today - possible atrium enlargement she agreed on having Echo done

## 2023-06-05 ENCOUNTER — Ambulatory Visit: Payer: Managed Care, Other (non HMO) | Admitting: Family Medicine

## 2023-06-05 ENCOUNTER — Encounter: Payer: Self-pay | Admitting: Family Medicine

## 2023-06-05 VITALS — BP 126/72 | HR 75 | Temp 98.0°F | Resp 14 | Ht 64.0 in | Wt 157.1 lb

## 2023-06-05 DIAGNOSIS — D500 Iron deficiency anemia secondary to blood loss (chronic): Secondary | ICD-10-CM

## 2023-06-05 DIAGNOSIS — Z800 Family history of malignant neoplasm of digestive organs: Secondary | ICD-10-CM

## 2023-06-05 DIAGNOSIS — E049 Nontoxic goiter, unspecified: Secondary | ICD-10-CM | POA: Diagnosis not present

## 2023-06-05 DIAGNOSIS — E538 Deficiency of other specified B group vitamins: Secondary | ICD-10-CM

## 2023-06-05 DIAGNOSIS — I10 Essential (primary) hypertension: Secondary | ICD-10-CM | POA: Insufficient documentation

## 2023-06-05 DIAGNOSIS — R9431 Abnormal electrocardiogram [ECG] [EKG]: Secondary | ICD-10-CM

## 2023-06-13 ENCOUNTER — Encounter: Payer: Self-pay | Admitting: Family Medicine

## 2023-06-14 ENCOUNTER — Ambulatory Visit: Payer: Managed Care, Other (non HMO) | Admitting: Family Medicine

## 2023-06-27 ENCOUNTER — Telehealth: Payer: Self-pay

## 2023-06-27 NOTE — Telephone Encounter (Signed)
Received incoming referral for the patient, the patient called in to schedule an appointment. I sent out an appointment reminder with a No-Show letter and a map. I verified and updated the information in her chart, including the insurance guarantor and other relevant details. Patient is aware of her $65 copay.

## 2023-07-07 ENCOUNTER — Ambulatory Visit
Admission: RE | Admit: 2023-07-07 | Discharge: 2023-07-07 | Disposition: A | Discharge status: Home / Self Care | Payer: Managed Care, Other (non HMO) | Source: Ambulatory Visit | Attending: Family Medicine | Admitting: Family Medicine

## 2023-07-07 DIAGNOSIS — I10 Essential (primary) hypertension: Secondary | ICD-10-CM | POA: Insufficient documentation

## 2023-07-07 DIAGNOSIS — R9431 Abnormal electrocardiogram [ECG] [EKG]: Secondary | ICD-10-CM | POA: Insufficient documentation

## 2023-07-07 LAB — ECHOCARDIOGRAM COMPLETE
AR max vel: 2.92 cm2
AV Area VTI: 3.11 cm2
AV Area mean vel: 2.79 cm2
AV Mean grad: 3 mm[Hg]
AV Peak grad: 6 mm[Hg]
Ao pk vel: 1.23 m/s
Area-P 1/2: 5.46 cm2
MV VTI: 3.17 cm2
S' Lateral: 2.6 cm

## 2023-07-07 NOTE — Progress Notes (Signed)
*  PRELIMINARY RESULTS* Echocardiogram 2D Echocardiogram has been performed.  Nicole Roman 07/07/2023, 9:50 AM

## 2023-07-25 ENCOUNTER — Encounter: Payer: Self-pay | Admitting: Family Medicine

## 2023-08-02 ENCOUNTER — Ambulatory Visit: Payer: Managed Care, Other (non HMO) | Admitting: Physician Assistant

## 2023-09-12 NOTE — Progress Notes (Unsigned)
 Celso Amy, PA-C [address]  [address]  [place], [place] [address]  Main: [phone]  Fax: [phone]   Gastroenterology Consultation  Referring Provider:     Alba Cory, MD Primary Care Physician:  Alba Cory, MD Primary Gastroenterologist:  Celso Amy, PA-C  Reason for Consultation:     Iron Defic. Anemia; Family History of Colon Cancer        HPI:   Nicole Roman is a 48 y.o. y/o female referred for consultation & management  by Alba Cory, MD. patient states she has had chronic iron deficiency anemia all of her life.  She underwent hysterectomy 05/2022.  Since then her anemia has improved, however still has mild iron deficiency.  She takes OTC iron tablets sporadically.  She does not donate blood.  She has not eaten red meat in 8 years.  She admits to occasional heartburn if she eats spicy foods.  Takes Tums and charcoal which helps.  No other treatment for acid reflux.  She denies abdominal pain, diarrhea, constipation, melena, hematochezia, or weight loss.  05/2023: Hemoglobin 11.4, MCV 86.  Normal lipase.  Mild low potassium 3.3, sodium 134.  Otherwise normal CMP.  Low iron saturation 14%, normal total iron 62.  Normal Vitamin B12 of 434. 05/2022: Hemoglobin 7.0, MCV 74.  12/2019 colonoscopy by Dr. Maximino Greenland: 4 small (3 mm to 4 mm) tubular adenoma polyps removed.  Good prep.  3-year repeat colonoscopy is overdue.  No previous EGD.  Rarely takes OTC NSAID.  05/2023 CT abdomen pelvis without contrast (to evaluate LLQ pain) showed no acute abnormality.  Past Medical History:  Diagnosis Date   Breast lump    Iron deficiency anemia    Vaginal Pap smear, abnormal     Past Surgical History:  Procedure Laterality Date   AUGMENTATION MAMMAPLASTY Bilateral 2019   breast inplant Bilateral 07/30/2017   Dr. Lysbeth Penner with [facility] in [facility]   COLONOSCOPY WITH PROPOFOL N/A 01/15/2020   Procedure: COLONOSCOPY WITH PROPOFOL;  Surgeon:  Pasty Spillers, MD;  Location: [facility];  Service: Gastroenterology;  Laterality: N/A;   LAPAROSCOPIC VAGINAL HYSTERECTOMY WITH SALPINGECTOMY Bilateral 06/06/2022   Procedure: LAPAROSCOPIC ASSISTED VAGINAL HYSTERECTOMY WITH SALPINGECTOMY;  Surgeon: Hildred Laser, MD;  Location: [facility] OR[facility];  Service: Gynecology;  Laterality: Bilateral;   LEEP  2002   tummy tuck  07/30/2017   Dr. Lysbeth Penner with [facility] in [place]    Prior to Admission medications   Medication Sig Start Date End Date Taking? Authorizing Provider  amLODipine (NORVASC) 5 MG tablet Take 5 mg by mouth daily.    [provider]  cyanocobalamin (VITAMIN B12) 1000 MCG/ML injection Inject 1 mL (1,000 mcg total) into the muscle every 30 (thirty) days. 01/31/23   Alba Cory, MD  simethicone (MYLICON) 125 MG chewable tablet Chew 1 tablet (125 mg total) by mouth every 6 (six) hours as needed for flatulence. 05/24/23   Fayrene Helper, PA-C    Family History  Problem Relation Age of Onset   Cancer Mother        colon   Hypertension Mother    Diabetes Father    Breast cancer Neg Hx      Social History   Tobacco Use   Smoking status: Never   Smokeless tobacco: Never  Vaping Use   Vaping status: Never Used  Substance Use Topics   Alcohol use: Yes    Comment: occasionally   Drug use: No    Allergies as of 09/13/2023 -  Review Complete 09/13/2023  Allergen Reaction Noted   Diflucan [fluconazole] Rash 08/24/2020   Tetanus toxoids Rash 08/24/2020    Review of Systems:    All systems reviewed and negative except where noted in HPI.   Physical Exam:  BP (!) 147/82 (BP Location: Left Arm, Patient Position: Sitting, Cuff Size: Normal)   Pulse 80   Temp 98.7 F (37.1 C) (Oral)   Ht 5\' 4"  (1.626 m)   Wt 156 lb (70.8 kg)   LMP  (LMP Unknown)   BMI 26.78 kg/m  No LMP recorded (lmp unknown). Patient has had a hysterectomy.  General:   Alert,  Well-developed, well-nourished, pleasant and cooperative  in NAD Lungs:  Respirations even and unlabored.  Clear throughout to auscultation.   No wheezes, crackles, or rhonchi. No acute distress. Heart:  Regular rate and rhythm; no murmurs, clicks, rubs, or gallops. Abdomen:  Normal bowel sounds.  No bruits.  Soft, and non-distended without masses, hepatosplenomegaly or hernias noted.  No Tenderness.  No guarding or rebound tenderness.    Neurologic:  Alert and oriented x3;  grossly normal neurologically. Psych:  Alert and cooperative. Normal mood and affect.  Imaging Studies: No results found.  Assessment and Plan:   Nicole Roman is a 48 y.o. y/o female has been referred for:  1.  Chronic mild persistent iron deficiency anemia s/p hysterectomy 05/2022.  Currently on iron tablet daily.  Scheduling EGD -evaluate for PUD, celiac, gastritis, H. pylori. I discussed risks of EGD with patient to include risk of bleeding, perforation, and risk of sedation.  Patient expressed understanding and agrees to proceed with EGD.   2.  Mild GERD  Recommend Lifestyle Modifications to prevent Acid Reflux.  Rec. Avoid coffee, sodas, peppermint, garlic, onions, alcohol, citrus fruits, chocolate, tomatoes, fatty and spicey foods.  Avoid eating 2-3 hours before bedtime.    Recommend OTC Pepcid 20 Mg twice daily, Prilosec 20 Mg once daily, or antacid as needed.  3.  History of adenomatous colon polyps  Scheduling Colonoscopy I discussed risks of colonoscopy with patient to include risk of bleeding, colon perforation, and risk of sedation.  Patient expressed understanding and agrees to proceed with colonoscopy.   4.  Family history of colon cancer mother age 25's  Follow up as needed based on procedure results and symptoms.  Celso Amy, PA-C

## 2023-09-13 ENCOUNTER — Ambulatory Visit: Payer: Managed Care, Other (non HMO) | Admitting: Physician Assistant

## 2023-09-13 ENCOUNTER — Encounter: Payer: Self-pay | Admitting: Physician Assistant

## 2023-09-13 VITALS — BP 147/82 | HR 80 | Temp 98.7°F | Ht 64.0 in | Wt 156.0 lb

## 2023-09-13 DIAGNOSIS — Z800 Family history of malignant neoplasm of digestive organs: Secondary | ICD-10-CM

## 2023-09-13 DIAGNOSIS — K219 Gastro-esophageal reflux disease without esophagitis: Secondary | ICD-10-CM | POA: Diagnosis not present

## 2023-09-13 DIAGNOSIS — D509 Iron deficiency anemia, unspecified: Secondary | ICD-10-CM

## 2023-09-13 DIAGNOSIS — Z860100 Personal history of colon polyps, unspecified: Secondary | ICD-10-CM

## 2023-09-13 DIAGNOSIS — Z860101 Personal history of adenomatous and serrated colon polyps: Secondary | ICD-10-CM

## 2023-09-13 MED ORDER — NA SULFATE-K SULFATE-MG SULF 17.5-3.13-1.6 GM/177ML PO SOLN: 1.0000 | Freq: Once | ORAL | 0 refills | Status: AC

## 2023-09-13 NOTE — Addendum Note (Signed)
 Addended by: Roena Malady on: 09/13/2023 12:31 PM   Modules accepted: Orders

## 2023-09-25 ENCOUNTER — Telehealth: Payer: Self-pay | Admitting: Physician Assistant

## 2023-09-25 NOTE — Telephone Encounter (Signed)
 The patient called in because she has some question about [facility].

## 2023-10-09 ENCOUNTER — Encounter: Payer: Self-pay | Admitting: Gastroenterology

## 2023-10-10 ENCOUNTER — Telehealth: Payer: Self-pay

## 2023-10-10 ENCOUNTER — Ambulatory Visit
Admission: RE | Admit: 2023-10-10 | Discharge: 2023-10-10 | Disposition: A | Discharge status: Home / Self Care | Payer: Managed Care, Other (non HMO) | Attending: Gastroenterology | Admitting: Gastroenterology

## 2023-10-10 ENCOUNTER — Encounter: Payer: Self-pay | Admitting: Gastroenterology

## 2023-10-10 ENCOUNTER — Ambulatory Visit: Admitting: Anesthesiology

## 2023-10-10 ENCOUNTER — Other Ambulatory Visit: Payer: Self-pay

## 2023-10-10 ENCOUNTER — Encounter
Admission: RE | Disposition: A | Discharge status: Home / Self Care | Payer: Self-pay | Source: Home / Self Care | Attending: Gastroenterology

## 2023-10-10 DIAGNOSIS — K640 First degree hemorrhoids: Secondary | ICD-10-CM | POA: Insufficient documentation

## 2023-10-10 DIAGNOSIS — K219 Gastro-esophageal reflux disease without esophagitis: Secondary | ICD-10-CM | POA: Insufficient documentation

## 2023-10-10 DIAGNOSIS — Z860100 Personal history of colon polyps, unspecified: Secondary | ICD-10-CM

## 2023-10-10 DIAGNOSIS — D509 Iron deficiency anemia, unspecified: Secondary | ICD-10-CM | POA: Diagnosis not present

## 2023-10-10 DIAGNOSIS — I10 Essential (primary) hypertension: Secondary | ICD-10-CM | POA: Insufficient documentation

## 2023-10-10 DIAGNOSIS — Z800 Family history of malignant neoplasm of digestive organs: Secondary | ICD-10-CM

## 2023-10-10 DIAGNOSIS — Z1211 Encounter for screening for malignant neoplasm of colon: Secondary | ICD-10-CM | POA: Diagnosis present

## 2023-10-10 HISTORY — PX: ESOPHAGOGASTRODUODENOSCOPY (EGD) WITH PROPOFOL: SHX5813

## 2023-10-10 HISTORY — PX: COLONOSCOPY WITH PROPOFOL: SHX5780

## 2023-10-10 SURGERY — COLONOSCOPY WITH PROPOFOL
Anesthesia: General

## 2023-10-10 MED ORDER — PROPOFOL 10 MG/ML IV BOLUS
INTRAVENOUS | Status: DC | PRN
Start: 2023-10-10 — End: 2023-10-10
  Administered 2023-10-10 (×2): 40 mg via INTRAVENOUS
  Administered 2023-10-10: 50 mg via INTRAVENOUS
  Administered 2023-10-10 (×2): 40 mg via INTRAVENOUS

## 2023-10-10 MED ORDER — LIDOCAINE HCL (PF) 2 % IJ SOLN
INTRAMUSCULAR | Status: DC | PRN
  Administered 2023-10-10: 40 mg via INTRADERMAL

## 2023-10-10 MED ORDER — SODIUM CHLORIDE 0.9 % IV SOLN: INTRAVENOUS | Status: DC

## 2023-10-10 NOTE — Telephone Encounter (Signed)
Schedule capsule study 

## 2023-10-10 NOTE — Anesthesia Postprocedure Evaluation (Signed)
 Anesthesia Post Note  Patient: Nicole Roman  Procedure(s) Performed: COLONOSCOPY WITH PROPOFOL ESOPHAGOGASTRODUODENOSCOPY (EGD) WITH PROPOFOL  Patient location during evaluation: PACU Anesthesia Type: General Level of consciousness: awake and alert, oriented and patient cooperative Pain management: pain level controlled Vital Signs Assessment: post-procedure vital signs reviewed and stable Respiratory status: spontaneous breathing, nonlabored ventilation and respiratory function stable Cardiovascular status: blood pressure returned to baseline and stable Postop Assessment: adequate PO intake Anesthetic complications: no   No notable events documented.   Last Vitals:  Vitals:   10/10/23 0927 10/10/23 0937  BP: 130/76 (!) 140/85  Pulse: 65 64  Resp: 15 11  Temp:    SpO2: 100% 99%    Last Pain:  Vitals:   10/10/23 0937  TempSrc:   PainSc: 0-No pain                 Reed Breech

## 2023-10-10 NOTE — Transfer of Care (Signed)
 Immediate Anesthesia Transfer of Care Note  Patient: Nicole Roman  Procedure(s) Performed: COLONOSCOPY WITH PROPOFOL ESOPHAGOGASTRODUODENOSCOPY (EGD) WITH PROPOFOL  Patient Location: PACU and Endoscopy Unit  Anesthesia Type:MAC  Level of Consciousness: drowsy  Airway & Oxygen Therapy: Patient Spontanous Breathing and Patient connected to nasal cannula oxygen  Post-op Assessment: Report given to RN and Post -op Vital signs reviewed and stable  Post vital signs: Reviewed and stable  Last Vitals:  Vitals Value Taken Time  BP    Temp    Pulse 80 10/10/23 0918  Resp 14 10/10/23 0918  SpO2 100 % 10/10/23 0918  Vitals shown include unfiled device data.  Last Pain:  Vitals:   10/10/23 0916  TempSrc: Temporal  PainSc: Asleep         Complications: No notable events documented.

## 2023-10-10 NOTE — Op Note (Signed)
 [facility] Gastroenterology Patient Name: Nicole Roman Procedure Date: 10/10/2023 8:54 AM MRN: [id number] Account #: [id number] Date of Birth: 16-Feb-1976 Admit Type: Outpatient Age: 48 Room: [facility] ENDO ROOM 4 Gender: Female Note Status: Finalized Instrument Name: Prentice Docker [id number] Procedure:             Colonoscopy Indications:           High risk colon cancer surveillance: Personal history                         of colonic polyps Providers:             Midge Minium MD, MD Referring MD:          Midge Minium MD, MD (Referring MD), Onnie Boer. Sowles,                         MD (Referring MD) Medicines:             Propofol per Anesthesia Complications:         No immediate complications. Procedure:             Pre-Anesthesia Assessment:                        - Prior to the procedure, a History and Physical was                         performed, and patient medications and allergies were                         reviewed. The patient's tolerance of previous                         anesthesia was also reviewed. The risks and benefits                         of the procedure and the sedation options and risks                         were discussed with the patient. All questions were                         answered, and informed consent was obtained. Prior                         Anticoagulants: The patient has taken no anticoagulant                         or antiplatelet agents. ASA Grade Assessment: II - A                         patient with mild systemic disease. After reviewing                         the risks and benefits, the patient was deemed in                         satisfactory condition to undergo the procedure.  After obtaining informed consent, the colonoscope was                         passed under direct vision. Throughout the procedure,                         the patient's blood pressure, pulse, and oxygen                          saturations were monitored continuously. The                         Colonoscope was introduced through the anus and                         advanced to the the terminal ileum. The colonoscopy                         was performed without difficulty. The patient                         tolerated the procedure well. The quality of the bowel                         preparation was excellent. Findings:      The perianal and digital rectal examinations were normal.      Non-bleeding internal hemorrhoids were found during retroflexion. The       hemorrhoids were Grade I (internal hemorrhoids that do not prolapse).      The terminal ileum appeared normal. Impression:            - Non-bleeding internal hemorrhoids.                        - The examined portion of the ileum was normal.                        - No specimens collected. Recommendation:        - Discharge patient to home.                        - Resume previous diet.                        - Continue present medications.                        - Repeat colonoscopy in 7 years for surveillance.                        - To visualize the small bowel, perform video capsule                         endoscopy. Procedure Code(s):     --- Professional ---                        [phone], Colonoscopy, flexible; diagnostic, including                         collection of specimen(s) by  brushing or washing, when                         performed (separate procedure) Diagnosis Code(s):     --- Professional ---                        Z86.010, Personal history of colonic polyps CPT copyright 2022 American Medical Association. All rights reserved. The codes documented in this report are preliminary and upon coder review may  be revised to meet current compliance requirements. Midge Minium MD, MD 10/10/2023 9:17:06 AM This report has been signed electronically. Number of Addenda: 0 Note Initiated On: 10/10/2023 8:54 AM Scope Withdrawal Time: 0  hours 6 minutes 18 seconds  Total Procedure Duration: 0 hours 10 minutes 1 second  Estimated Blood Loss:  Estimated blood loss: none.      [facility] - 

## 2023-10-10 NOTE — H&P (Signed)
 Midge Minium, MD [facility] [address] [place], [place] [address] [phone] Fax : [phone]  Primary Care Physician:  Alba Cory, MD Primary Gastroenterologist:  Dr. Servando Snare  Pre-Procedure History & Physical: HPI:  Nicole Roman is a 48 y.o. female is here for an endoscopy and colonoscopy.   Past Medical History:  Diagnosis Date   Breast lump    Iron deficiency anemia    Vaginal Pap smear, abnormal     Past Surgical History:  Procedure Laterality Date   AUGMENTATION MAMMAPLASTY Bilateral 2019   breast inplant Bilateral 07/30/2017   Dr. Lysbeth Penner with [facility] in [facility]   COLONOSCOPY WITH PROPOFOL N/A 01/15/2020   Procedure: COLONOSCOPY WITH PROPOFOL;  Surgeon: Pasty Spillers, MD;  Location: [facility] ENDOSCOPY;  Service: Gastroenterology;  Laterality: N/A;   LAPAROSCOPIC VAGINAL HYSTERECTOMY WITH SALPINGECTOMY Bilateral 06/06/2022   Procedure: LAPAROSCOPIC ASSISTED VAGINAL HYSTERECTOMY WITH SALPINGECTOMY;  Surgeon: Hildred Laser, MD;  Location: [facility] ORS;  Service: Gynecology;  Laterality: Bilateral;   LEEP  2002   tummy tuck  07/30/2017   Dr. Lysbeth Penner with [facility] in [place]    Prior to Admission medications   Medication Sig Start Date End Date Taking? Authorizing Provider  amLODipine (NORVASC) 5 MG tablet Take 5 mg by mouth daily.   Yes [provider]  cyanocobalamin (VITAMIN B12) 1000 MCG/ML injection Inject 1 mL (1,000 mcg total) into the muscle every 30 (thirty) days. 01/31/23  Yes Sowles, Danna Hefty, MD  simethicone (MYLICON) 125 MG chewable tablet Chew 1 tablet (125 mg total) by mouth every 6 (six) hours as needed for flatulence. Patient not taking: Reported on 10/10/2023 05/24/23   Fayrene Helper, PA-C    Allergies as of 09/13/2023 - Review Complete 09/13/2023  Allergen Reaction Noted   Diflucan [fluconazole] Rash 08/24/2020   Tetanus toxoids Rash 08/24/2020    Family History  Problem Relation Age of Onset   Cancer  Mother        colon   Hypertension Mother    Diabetes Father    Breast cancer Neg Hx     Social History   Socioeconomic History   Marital status: Divorced    Spouse name: Not on file   Number of children: 2   Years of education: Not on file   Highest education level: Some college, no degree  Occupational History   Not on file  Tobacco Use   Smoking status: Never   Smokeless tobacco: Never  Vaping Use   Vaping status: Never Used  Substance and Sexual Activity   Alcohol use: Yes    Comment: occasionally   Drug use: No   Sexual activity: Yes    Partners: Male    Birth control/protection: I.U.D.  Other Topics Concern   Not on file  Social History Narrative   Patient is originally from [place]   Dating for many years   Social Drivers of Corporate investment banker Strain: Low Risk  (12/06/2022)   Overall Financial Resource Strain (CARDIA)    Difficulty of Paying Living Expenses: Not very hard  Food Insecurity: No Food Insecurity (12/06/2022)   Hunger Vital Sign    Worried About Running Out of Food in the Last Year: Never true    Ran Out of Food in the Last Year: Never true  Transportation Needs: No Transportation Needs (12/06/2022)   PRAPARE - Administrator, Civil Service (Medical): No    Lack of Transportation (Non-Medical): No  Physical Activity: Sufficiently Active (12/06/2022)  Exercise Vital Sign    Days of Exercise per Week: 3 days    Minutes of Exercise per Session: 50 min  Stress: No Stress Concern Present (12/06/2022)   [employer] of Occupational Health - Occupational Stress Questionnaire    Feeling of Stress : Only a little  Social Connections: Moderately Isolated (12/06/2022)   Social Connection and Isolation Panel [NHANES]    Frequency of Communication with Friends and Family: More than three times a week    Frequency of Social Gatherings with Friends and Family: Once a week    Attends Religious Services: 1 to 4 times per year    Active  Member of [employer] or Organizations: No    Attends Banker Meetings: Never    Marital Status: Divorced  Catering manager Violence: Not At Risk (12/06/2022)   Humiliation, Afraid, Rape, and Kick questionnaire    Fear of Current or Ex-Partner: No    Emotionally Abused: No    Physically Abused: No    Sexually Abused: No    Review of Systems: See HPI, otherwise negative ROS  Physical Exam: BP 134/80   Pulse 61   Temp 97.7 F (36.5 C) (Temporal)   Resp 16   Ht 5\' 4"  (1.626 m)   Wt 69.3 kg   LMP  (LMP Unknown)   SpO2 100%   BMI 26.23 kg/m  General:   Alert,  pleasant and cooperative in NAD Head:  Normocephalic and atraumatic. Neck:  Supple; no masses or thyromegaly. Lungs:  Clear throughout to auscultation.    Heart:  Regular rate and rhythm. Abdomen:  Soft, nontender and nondistended. Normal bowel sounds, without guarding, and without rebound.   Neurologic:  Alert and  oriented x4;  grossly normal neurologically.  Impression/Plan: Nicole Roman is here for an endoscopy and colonoscopy to be performed for a history of adenomatous polyps on 2021 and IDA   Risks, benefits, limitations, and alternatives regarding  endoscopy and colonoscopy have been reviewed with the patient.  Questions have been answered.  All parties agreeable.   Midge Minium, MD  10/10/2023, 8:20 AM

## 2023-10-10 NOTE — Anesthesia Preprocedure Evaluation (Addendum)
 Anesthesia Evaluation  Patient identified by MRN, date of birth, ID band Patient awake    Reviewed: Allergy & Precautions, NPO status , Patient's Chart, lab work & pertinent test results  History of Anesthesia Complications Negative for: history of anesthetic complications  Airway Mallampati: I   Neck ROM: Full    Dental no notable dental hx.    Pulmonary neg pulmonary ROS   Pulmonary exam normal breath sounds clear to auscultation       Cardiovascular hypertension, Normal cardiovascular exam Rhythm:Regular Rate:Normal  ECG 06/05/23: NSR; possible LAE   Neuro/Psych negative neurological ROS     GI/Hepatic ,GERD  ,,  Endo/Other  negative endocrine ROS    Renal/GU negative Renal ROS     Musculoskeletal   Abdominal   Peds  Hematology  (+) Blood dyscrasia, anemia   Anesthesia Other Findings   Reproductive/Obstetrics                             Anesthesia Physical Anesthesia Plan  ASA: 2  Anesthesia Plan: General   Post-op Pain Management:    Induction: Intravenous  PONV Risk Score and Plan: 3 and Propofol infusion, TIVA and Treatment may vary due to age or medical condition  Airway Management Planned: Natural Airway  Additional Equipment:   Intra-op Plan:   Post-operative Plan:   Informed Consent: I have reviewed the patients History and Physical, chart, labs and discussed the procedure including the risks, benefits and alternatives for the proposed anesthesia with the patient or authorized representative who has indicated his/her understanding and acceptance.       Plan Discussed with: CRNA  Anesthesia Plan Comments: (LMA/GETA backup discussed.  Patient consented for risks of anesthesia including but not limited to:  - adverse reactions to medications - damage to eyes, teeth, lips or other oral mucosa - nerve damage due to positioning  - sore throat or hoarseness -  damage to heart, brain, nerves, lungs, other parts of body or loss of life  Informed patient about role of CRNA in peri- and intra-operative care.  Patient voiced understanding.)        Anesthesia Quick Evaluation

## 2023-10-10 NOTE — Op Note (Signed)
 [facility] Gastroenterology Patient Name: Nicole Roman Procedure Date: 10/10/2023 8:55 AM MRN: [id number] Account #: [id number] Date of Birth: 06-24-1976 Admit Type: Outpatient Age: 48 Room: [facility] ENDO ROOM 4 Gender: Female Note Status: Finalized Instrument Name: Upper Endoscope [id number] Procedure:             Upper GI endoscopy Indications:           Iron deficiency anemia Providers:             Midge Minium MD, MD Referring MD:          Midge Minium MD, MD (Referring MD), Onnie Boer. Sowles,                         MD (Referring MD) Medicines:             Propofol per Anesthesia Complications:         No immediate complications. Procedure:             Pre-Anesthesia Assessment:                        - Prior to the procedure, a History and Physical was                         performed, and patient medications and allergies were                         reviewed. The patient's tolerance of previous                         anesthesia was also reviewed. The risks and benefits                         of the procedure and the sedation options and risks                         were discussed with the patient. All questions were                         answered, and informed consent was obtained. Prior                         Anticoagulants: The patient has taken no anticoagulant                         or antiplatelet agents. ASA Grade Assessment: II - A                         patient with mild systemic disease. After reviewing                         the risks and benefits, the patient was deemed in                         satisfactory condition to undergo the procedure.                        After obtaining informed consent, the endoscope was  passed under direct vision. Throughout the procedure,                         the patient's blood pressure, pulse, and oxygen                         saturations were monitored continuously. The Endoscope                          was introduced through the mouth, and advanced to the                         second part of duodenum. The upper GI endoscopy was                         accomplished without difficulty. The patient tolerated                         the procedure well. Findings:      The examined esophagus was normal.      The stomach was normal.      The examined duodenum was normal. Biopsies for histology were taken with       a cold forceps for evaluation of celiac disease. Impression:            - Normal esophagus.                        - Normal stomach.                        - Normal examined duodenum. Biopsied. Recommendation:        - Discharge patient to home.                        - Resume previous diet.                        - Continue present medications.                        - Await pathology results.                        - Perform a colonoscopy today. Procedure Code(s):     --- Professional ---                        [phone], Esophagogastroduodenoscopy, flexible,                         transoral; with biopsy, single or multiple Diagnosis Code(s):     --- Professional ---                        D50.9, Iron deficiency anemia, unspecified CPT copyright 2022 American Medical Association. All rights reserved. The codes documented in this report are preliminary and upon coder review may  be revised to meet current compliance requirements. Midge Minium MD, MD 10/10/2023 9:03:48 AM This report has been signed electronically. Number of Addenda: 0 Note Initiated On: 10/10/2023 8:55 AM Estimated Blood Loss:  Estimated blood loss: none.  [facility]

## 2023-10-11 ENCOUNTER — Encounter: Payer: Self-pay | Admitting: Gastroenterology

## 2023-10-11 LAB — SURGICAL PATHOLOGY

## 2023-10-12 ENCOUNTER — Encounter: Payer: Self-pay | Admitting: Gastroenterology

## 2023-10-12 NOTE — Telephone Encounter (Signed)
 Called pt to schedule capsule study, no answer and not able to lmovm

## 2023-10-17 NOTE — Telephone Encounter (Signed)
Called pt, no answer and VM not set up 

## 2023-11-09 NOTE — Telephone Encounter (Signed)
Unable to contact letter mailed to pt.

## 2023-12-06 ENCOUNTER — Ambulatory Visit: Payer: Self-pay | Admitting: Family Medicine

## 2023-12-13 ENCOUNTER — Encounter: Payer: Self-pay | Admitting: Family Medicine

## 2023-12-13 ENCOUNTER — Ambulatory Visit: Admitting: Family Medicine

## 2023-12-13 VITALS — BP 110/70 | HR 92 | Resp 16 | Ht 64.0 in | Wt 157.6 lb

## 2023-12-13 DIAGNOSIS — N6315 Unspecified lump in the right breast, overlapping quadrants: Secondary | ICD-10-CM

## 2023-12-13 DIAGNOSIS — D500 Iron deficiency anemia secondary to blood loss (chronic): Secondary | ICD-10-CM | POA: Diagnosis not present

## 2023-12-13 DIAGNOSIS — E049 Nontoxic goiter, unspecified: Secondary | ICD-10-CM

## 2023-12-13 DIAGNOSIS — E559 Vitamin D deficiency, unspecified: Secondary | ICD-10-CM

## 2023-12-13 DIAGNOSIS — I10 Essential (primary) hypertension: Secondary | ICD-10-CM

## 2023-12-13 DIAGNOSIS — Z13220 Encounter for screening for lipoid disorders: Secondary | ICD-10-CM

## 2023-12-13 DIAGNOSIS — Z90710 Acquired absence of both cervix and uterus: Secondary | ICD-10-CM

## 2023-12-13 DIAGNOSIS — E538 Deficiency of other specified B group vitamins: Secondary | ICD-10-CM

## 2023-12-13 NOTE — Progress Notes (Signed)
 Name: Nicole Roman   MRN: [id number]    DOB: 06-14-1976   Date:12/13/2023       Progress Note  Subjective  Chief Complaint  Chief Complaint  Patient presents with   Medical Management of Chronic Issues   Breast Problem    Previous cyst has grown over time   Discussed the use of AI scribe software for clinical note transcription with the patient, who gave verbal consent to proceed.  History of Present Illness Nicole Roman is a 48 year old female who presents with concerns about a breast lump.  She has a history of breast cysts and recently noticed a change in one of them on her right breast. The lump feels different, larger, and has more fluid than before. She has undergone breast augmentation with implants placed under the muscle.  She had a colonoscopy in March, which was normal except for internal hemorrhoids. An endoscopy was also normal. There is a plan for a capsuloscopy due to her history of iron  deficiency anemia. Her iron  deficiency anemia has improved since her hysterectomy in November 2023, with hemoglobin levels increasing from 7 to 11.4. She occasionally takes iron  supplements. A family history of colon cancer influences her follow-up care.  She has a history of B12 deficiency, previously managed with B12 shots, now taking a high-dose sublingual supplement.   She also has a  goiter, with a biopsy in 2013 and an ultrasound in 2019 showing a nodule that did not require further biopsy. No symptoms such as difficulty swallowing, hair loss, or dry skin are reported.  She has hypertension, managed with amlodipine 5 mg. No chest pain, palpitations, or dizziness. She exercises regularly, working out four to five days a week, and feels better than before her hysterectomy.    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 Namon Avena with [facility] in [place]   COLONOSCOPY WITH PROPOFOL  N/A 01/15/2020   Procedure: COLONOSCOPY WITH PROPOFOL ;  Surgeon: Irby Mannan, MD;  Location: [facility] ENDOSCOPY;  Service: Gastroenterology;  Laterality: N/A;   COLONOSCOPY WITH PROPOFOL  N/A 10/10/2023   Procedure: COLONOSCOPY WITH PROPOFOL ;  Surgeon: Marnee Sink, MD;  Location: [facility] ENDOSCOPY;  Service: Endoscopy;  Laterality: N/A;   ESOPHAGOGASTRODUODENOSCOPY (EGD) WITH PROPOFOL  N/A 10/10/2023   Procedure: ESOPHAGOGASTRODUODENOSCOPY (EGD) WITH PROPOFOL ;  Surgeon: Marnee Sink, MD;  Location: [facility] ENDOSCOPY;  Service: Endoscopy;  Laterality: N/A;   LAPAROSCOPIC VAGINAL HYSTERECTOMY WITH SALPINGECTOMY Bilateral 06/06/2022   Procedure: LAPAROSCOPIC ASSISTED VAGINAL HYSTERECTOMY WITH SALPINGECTOMY;  Surgeon: Teresa Fender, MD;  Location: [facility] ORS;  Service: Gynecology;  Laterality: Bilateral;   LEEP  2002   tummy tuck  07/30/2017   Dr. Namon Avena with [facility] in [place]    Family History  Problem Relation Age of Onset   Cancer Mother        colon   Hypertension Mother    Diabetes Father    Breast cancer Neg Hx  Social History   Tobacco Use   Smoking status: Never   Smokeless tobacco: Never  Substance Use Topics   Alcohol use: Yes    Comment: occasionally     Current Outpatient Medications:    amLODipine (NORVASC) 5 MG tablet, Take 5 mg by mouth daily., Disp: , Rfl:    cyanocobalamin  (VITAMIN B12) 1000 MCG/ML injection, Inject 1 mL (1,000 mcg total) into the muscle every 30 (thirty) days., Disp: 1 mL, Rfl: 11   simethicone   (MYLICON) 125 MG chewable tablet, Chew 1 tablet (125 mg total) by mouth every 6 (six) hours as needed for flatulence. (Patient not taking: Reported on 10/10/2023), Disp: 30 tablet, Rfl: 0  Allergies  Allergen Reactions   Diflucan  [Fluconazole ] Rash   Tetanus Toxoids Rash    I personally reviewed active problem list, medication list, allergies, family history with the patient/caregiver today.   ROS  Ten systems reviewed and is negative except as mentioned in HPI    Objective Physical Exam Constitutional: Patient appears well-developed and well-nourished.  No distress.  HEENT: head atraumatic, normocephalic, pupils equal and reactive to light, neck supple Cardiovascular: Normal rate, regular rhythm and normal heart sounds.  No murmur heard. No BLE edema. Pulmonary/Chest: Effort normal and breath sounds normal. No respiratory distress. Abdominal: Soft.  There is no tenderness. Breast:  Psychiatric: Patient has a normal mood and affect. behavior is normal. Judgment and thought content normal.    VITALS: BP- 110/70 CONSTITUTIONAL: Patient appears well-developed and well-nourished. No distress. HEENT: Head atraumatic, normocephalic, neck supple. CARDIOVASCULAR: Normal rate, regular rhythm and normal heart sounds. No murmur heard. No BLE edema. PULMONARY: Effort normal and breath sounds normal. No respiratory distress. ABDOMINAL: There is no tenderness or distention. MUSCULOSKELETAL: Normal gait. Without gross motor or sensory deficit. PSYCHIATRIC: Patient has a normal mood and affect. Behavior is normal. Judgment and thought content normal. BREAST: Palpable lumps at 11 o'clock, 9-10 o'clock (2 inches from nipple, quarter-sized, firm), and 12 o'clock on right breast with tissue thickening noted.  Vitals:   12/13/23 1419  BP: 110/70  Pulse: 92  Resp: 16  SpO2: 97%  Weight: 157 lb 9.6 oz (71.5 kg)  Height: 5\' 4"  (1.626 m)    Body mass index is 27.05 kg/m.  Recent Results (from  the past 2160 hours)  Surgical pathology     Status: None   Collection Time: 10/10/23 12:00 AM  Result Value Ref Range   SURGICAL PATHOLOGY      SURGICAL PATHOLOGY [facility] [address] [place], [place] [address] Telephone [phone] or [phone] Fax [phone]  REPORT OF SURGICAL PATHOLOGY   Accession #: [id number] Patient Name: MARLINE, MORACE Visit # : [id number]  MRN: [id number] Physician: Marnee Sink DOB/Age [birth year] (Age: 50) Gender: F Collected Date: 10/10/2023 Received Date: 10/10/2023  FINAL DIAGNOSIS       1. Small Intestine Biopsy, cbx :       - UNREMARKABLE DUODENAL MUCOSA.      - NEGATIVE FOR FEATURES OF CELIAC, DYSPLASIA, AND MALIGNANCY.       DATE SIGNED OUT: 10/11/2023 ELECTRONIC SIGNATURE : Brunetta Capes Md, Alexandria Ida , Pathologist, Electronic Signature  MICROSCOPIC DESCRIPTION  CASE COMMENTS STAINS USED IN DIAGNOSIS: H&E    CLINICAL HISTORY  SPECIMEN(S) OBTAINED 1. Small Intestine Biopsy, Cbx  SPECIMEN COMMENTS: 1. r/o celiac SPECIMEN CLINICAL INFORMATION: 1. GERD, history of colon polyps, iron  deficiency anemia    Gross Descript ion 1. Received in formalin is a 0.7 x 0.7  x 0.2 cm aggregate of multiple soft, tan tissue fragments submitted entirely in block 1A.   (SB:kh 10/10/23)        Report signed out from the following location(s) [facility] [address] [place], [place] [address] CLIA #: [id number]  [facility] [address] [place], [place] [address] CLIA #: [id number]      PHQ2/9:    12/13/2023    2:16 PM 06/05/2023    3:46 PM 12/06/2022   10:03 AM 08/16/2022    1:42 PM 06/03/2022   10:30 AM  Depression screen PHQ 2/9  Decreased Interest 0 0 0 0 0  Down, Depressed, Hopeless 0 0 0 0 0  PHQ - 2 Score 0 0 0 0 0  Altered sleeping  0 0 0 0  Tired, decreased energy  0 0 0 0  Change in appetite  0 0 0 0  Feeling bad or failure about yourself   0  0 0 0  Trouble concentrating  0 0 0 0  Moving slowly or fidgety/restless  0 0 0 0  Suicidal thoughts  0 0 0 0  PHQ-9 Score  0 0 0 0    phq 9 is negative  Fall Risk:    12/13/2023    2:16 PM 06/05/2023    3:46 PM 08/16/2022    1:42 PM 08/02/2022    2:12 PM 06/03/2022   10:30 AM  Fall Risk   Falls in the past year? 0 0 0 0 0  Number falls in past yr: 0  0 0   Injury with Fall? 0  0 0   Risk for fall due to : No Fall Risks No Fall Risks No Fall Risks No Fall Risks No Fall Risks  Follow up Falls prevention discussed;Falls evaluation completed;Education provided Falls prevention discussed Falls prevention discussed  Falls prevention discussed;Education provided;Falls evaluation completed    Assessment & Plan Breast lump Change in size and consistency of right breast lump suggests need for further evaluation to rule out malignancy. - Order diagnostic mammogram and ultrasound for the right breast. - Instruct to schedule imaging tests. - Discuss potential need for biopsy if imaging suggests suspicious findings.  Iron  deficiency anemia Improved hemoglobin post-hysterectomy, but low ferritin indicates ongoing iron  deficiency. Further evaluation needed. - Order CBC, iron  studies, and ferritin level. - Advise continuation of iron  supplementation until ferritin normalizes. - Discuss potential need for capsuloscopy if anemia persists despite normalizing hemoglobin.  Hypertension Hypertension well-controlled with amlodipine. Current BP 110/70 mmHg. - Continue amlodipine 5 mg daily. - Order comprehensive metabolic panel. - Order lipid panel for annual cholesterol screening.  B12 deficiency B12 levels improved with sublingual supplements. - Continue sublingual B12 supplementation as needed.  Goiter Goiter with nodule, normal TSH, no symptoms of dysfunction. - Order TSH to monitor thyroid  function.

## 2023-12-15 LAB — CBC AND DIFFERENTIAL
HCT: 37 (ref 36–46)
Hemoglobin: 11.5 — AB (ref 12.0–16.0)
Platelets: 211 K/uL (ref 150–400)
WBC: 5.3

## 2023-12-15 LAB — BASIC METABOLIC PANEL WITH GFR: Glucose: 78

## 2023-12-15 LAB — IRON,TIBC AND FERRITIN PANEL
%SAT: 17
Ferritin: 18
Iron: 65
UIBC: 320

## 2023-12-20 ENCOUNTER — Encounter: Payer: Managed Care, Other (non HMO) | Admitting: Physician Assistant

## 2023-12-22 ENCOUNTER — Ambulatory Visit
Admission: RE | Admit: 2023-12-22 | Discharge: 2023-12-22 | Disposition: A | Discharge status: Home / Self Care | Source: Ambulatory Visit | Attending: Family Medicine | Admitting: Family Medicine

## 2023-12-22 DIAGNOSIS — N6315 Unspecified lump in the right breast, overlapping quadrants: Secondary | ICD-10-CM | POA: Diagnosis present

## 2023-12-25 ENCOUNTER — Other Ambulatory Visit: Payer: Self-pay | Admitting: Family Medicine

## 2023-12-25 DIAGNOSIS — N6001 Solitary cyst of right breast: Secondary | ICD-10-CM

## 2023-12-25 DIAGNOSIS — N644 Mastodynia: Secondary | ICD-10-CM

## 2024-01-01 ENCOUNTER — Other Ambulatory Visit

## 2024-01-01 ENCOUNTER — Encounter

## 2024-01-05 ENCOUNTER — Ambulatory Visit
Admission: RE | Admit: 2024-01-05 | Discharge: 2024-01-05 | Disposition: A | Discharge status: Home / Self Care | Source: Ambulatory Visit | Attending: Family Medicine | Admitting: Family Medicine

## 2024-01-05 DIAGNOSIS — N644 Mastodynia: Secondary | ICD-10-CM | POA: Diagnosis not present

## 2024-01-05 DIAGNOSIS — N6001 Solitary cyst of right breast: Secondary | ICD-10-CM | POA: Insufficient documentation

## 2024-01-05 MED ORDER — LIDOCAINE HCL 1 % IJ SOLN
5.0000 mL | Freq: Once | INTRAMUSCULAR | Status: AC
  Administered 2024-01-05: 5 mL
  Filled 2024-01-05: qty 5

## 2024-02-07 ENCOUNTER — Telehealth: Payer: Self-pay | Admitting: Family Medicine

## 2024-02-07 ENCOUNTER — Encounter: Payer: Self-pay | Admitting: Family Medicine

## 2024-02-07 ENCOUNTER — Ambulatory Visit (INDEPENDENT_AMBULATORY_CARE_PROVIDER_SITE_OTHER): Admitting: Family Medicine

## 2024-02-07 VITALS — BP 128/76 | HR 90 | Resp 16 | Ht 64.0 in | Wt 156.2 lb

## 2024-02-07 DIAGNOSIS — Z23 Encounter for immunization: Secondary | ICD-10-CM | POA: Diagnosis not present

## 2024-02-07 DIAGNOSIS — Z0000 Encounter for general adult medical examination without abnormal findings: Secondary | ICD-10-CM

## 2024-02-07 DIAGNOSIS — Z1159 Encounter for screening for other viral diseases: Secondary | ICD-10-CM

## 2024-02-07 NOTE — Telephone Encounter (Signed)
 Pt states that we need to contact [facility] to get her lab results. She will get her labs done today

## 2024-02-07 NOTE — Progress Notes (Signed)
 Name: Nicole Roman   MRN: [id number]    DOB: 1975/09/17   Date:02/07/2024       Progress Note  Subjective  Chief Complaint  Chief Complaint  Patient presents with   Annual Exam    HPI  Patient presents for annual CPE.  Diet: most of the time balanced diet, eats fish, chicken, seafood, vegetables, kale salads  Exercise: going to the gym 4 - 5 times a week for one hour each time, takes different classes   Last Eye Exam: completed Last Dental Exam: completed  Flowsheet Row Office Visit from [facility]/07/2024 in [facility]  AUDIT-C Score 1   Depression: Phq 9 is  negative    02/07/2024   10:28 AM 12/13/2023    2:16 PM 06/05/2023    3:46 PM 12/06/2022   10:03 AM 08/16/2022    1:42 PM  Depression screen PHQ 2/9  Decreased Interest 0 0 0 0 0  Down, Depressed, Hopeless 0 0 0 0 0  PHQ - 2 Score 0 0 0 0 0  Altered sleeping   0 0 0  Tired, decreased energy   0 0 0  Change in appetite   0 0 0  Feeling bad or failure about yourself    0 0 0  Trouble concentrating   0 0 0  Moving slowly or fidgety/restless   0 0 0  Suicidal thoughts   0 0 0  PHQ-9 Score   0 0 0   Hypertension: BP Readings from Last 3 Encounters:  02/07/24 128/76  12/13/23 110/70  10/10/23 (!) 140/85   Obesity: Wt Readings from Last 3 Encounters:  02/07/24 156 lb 3.2 oz (70.9 kg)  12/13/23 157 lb 9.6 oz (71.5 kg)  10/10/23 152 lb 12.8 oz (69.3 kg)   BMI Readings from Last 3 Encounters:  02/07/24 26.81 kg/m  12/13/23 27.05 kg/m  10/10/23 26.23 kg/m     Vaccines: reviewed with the patient.   Hep C Screening: completed STD testing and prevention (HIV/chl/gon/syphilis): N/A Intimate partner violence: negative screen  Sexual History : s/p hysterectomy  Menstrual History/LMP/Abnormal Bleeding: s/p hysterectomy  Discussed importance of follow up if any post-menopausal bleeding: not applicable  Incontinence Symptoms: negative for symptoms   Breast cancer:  - Last Mammogram: up  to date, had a right breast cyst aspiration recently  - BRCA gene screening:   Osteoporosis Prevention : Discussed high calcium and vitamin D  supplementation, weight bearing exercises Bone density :not applicable   Cervical cancer screening: not applicable due to hysterectomy  Skin cancer: Discussed monitoring for atypical lesions  Colorectal cancer: up to date   Lung cancer:  Low Dose CT Chest recommended if Age 59-80 years, 20 pack-year currently smoking OR have quit w/in 15years. Patient does not qualify for screen   ECG: 2024  Advanced Care Planning: A voluntary discussion about advance care planning including the explanation and discussion of advance directives.  Discussed health care proxy and Living will, and the patient was able to identify a health care proxy as sister - Ole Bohr .  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 Curtiss with [facility] in [place]   COLONOSCOPY WITH PROPOFOL  N/A 01/15/2020   Procedure: COLONOSCOPY WITH PROPOFOL ;  Surgeon: Janalyn Keene NOVAK, MD;  Location: [facility] ENDOSCOPY;  Service: Gastroenterology;  Laterality: N/A;   COLONOSCOPY WITH PROPOFOL  N/A 10/10/2023   Procedure: COLONOSCOPY WITH PROPOFOL ;  Surgeon: Jinny Carmine, MD;  Location[facility] END[facility]COPY;  Service: Endoscopy;  Laterality: N/A;   ESOPHAGOGASTRODUODENOSCOPY (EGD) WITH PROPOFOL   N/A 10/10/2023   Procedure: ESOPHAGOGASTRODUODENOSCOPY (EGD) WITH PROPOFOL ;  Surgeon: Jinny Carmine, MD;  Location: [facility] ENDOSCOPY;  Service: Endoscopy;  Laterality: N/A;   LAPAROSCOPIC VAGINAL HYSTERECTOMY WITH SALPINGECTOMY Bilateral 06/06/2022   Procedure: LAPAROSCOPIC ASSISTED VAGINAL HYSTERECTOMY WITH SALPINGECTOMY;  Surgeon: Connell Davies, MD;  Location: [facility] ORS;  Service: Gynecology;  Laterality: Bilateral;   LEEP  2002   tummy tuck  07/30/2017   Dr. Curtiss with [facility] in [place]    Family History  Problem Relation Age of Onset   Cancer Mother        colon   Hypertension Mother    Diabetes Father    Breast cancer Neg Hx     Social History   Socioeconomic History   Marital status: Divorced    Spouse name: Not on file   Number of children: 2   Years of education: Not on file   Highest education level: Some college, no degree  Occupational History   Not on file  Tobacco Use   Smoking status: Never   Smokeless tobacco: Never  Vaping Use   Vaping status: Never Used  Substance and Sexual Activity   Alcohol use: Yes    Comment: occasionally   Drug use: No   Sexual activity: Yes    Partners: Male    Comment: Hysterectomy  Other Topics Concern   Not on file  Social History Narrative   Patient is originally from [place]   Dating for many years   Social Drivers of Corporate investment banker Strain: Low Risk  (02/07/2024)   Overall Financial Resource Strain (CARDIA)    Difficulty of Paying Living Expenses: Not hard at all  Food Insecurity: No Food Insecurity (02/07/2024)   Hunger Vital Sign    Worried About Running Out of Food in the Last Year: Never true    Ran Out of Food in the Last Year: Never true  Transportation Needs: No Transportation Needs (02/07/2024)   PRAPARE - Administrator, Civil Service (Medical): No    Lack of Transportation (Non-Medical): No  Physical Activity: Sufficiently Active (02/07/2024)   Exercise Vital Sign    Days of  Exercise per Week: 5 days    Minutes of Exercise per Session: 60 min  Stress: No Stress Concern Present (02/07/2024)   [employer] of Occupational Health - Occupational Stress Questionnaire    Feeling of Stress: Not at all  Social Connections: Moderately Isolated (02/07/2024)   Social Connection and Isolation Panel    Frequency of Communication with Friends and Family: More than three times a week    Frequency of Social Gatherings with Friends and Family: Once a week    Attends Religious Services: 1 to 4 times per year    Active Member of [employer] or Organizations:  No    Attends Club or Organization Meetings: Never    Marital Status: Divorced  Catering manager Violence: Not At Risk (02/07/2024)   Humiliation, Afraid, Rape, and Kick questionnaire    Fear of Current or Ex-Partner: No    Emotionally Abused: No    Physically Abused: No    Sexually Abused: No     Current Outpatient Medications:    amLODipine (NORVASC) 5 MG tablet, Take 5 mg by mouth daily., Disp: , Rfl:    cyanocobalamin  (VITAMIN B12) 1000 MCG/ML injection, Inject 1 mL (1,000 mcg total) into the muscle every 30 (thirty) days., Disp: 1 mL, Rfl: 11  Allergies  Allergen Reactions   Diflucan  [Fluconazole ] Rash   Tetanus Toxoids Rash     ROS  Ten systems reviewed and is negative except as mentioned in HPI    Objective  Vitals:   02/07/24 1034  BP: 128/76  Pulse: 90  Resp: 16  SpO2: 99%  Weight: 156 lb 3.2 oz (70.9 kg)  Height: 5' 4 (1.626 m)    Body mass index is 26.81 kg/m.  Physical Exam  Constitutional: Patient appears well-developed and well-nourished. No distress.  HENT: Head: Normocephalic and atraumatic. Ears: B TMs ok, no erythema or effusion; Nose: Nose normal. Mouth/Throat: Oropharynx is clear and moist. No oropharyngeal exudate.  Eyes: Conjunctivae and EOM are normal. Pupils are equal, round, and reactive to light. No scleral icterus.  Neck: Normal range of motion. Neck supple. No JVD  present. No thyromegaly present.  Cardiovascular: Normal rate, regular rhythm and normal heart sounds.  No murmur heard. No BLE edema. Pulmonary/Chest: Effort normal and breath sounds normal. No respiratory distress. Abdominal: Soft. Bowel sounds are normal, no distension. There is no tenderness. no masses Breast: multiple lumps on right breast, s/p breast implant, up to date with mammogram and recent cyst aspiration , no nipple discharge or rashes FEMALE GENITALIA:  Not done  RECTAL: not done  Musculoskeletal: Normal range of motion, no joint effusions. No gross deformities Neurological: he is alert and oriented to person, place, and time. No cranial nerve deficit. Coordination, balance, strength, speech and gait are normal.  Skin: Skin is warm and dry. No rash noted. No erythema.  Psychiatric: Patient has a normal mood and affect. behavior is normal. Judgment and thought content normal.     Assessment & Plan  1. Well adult exam (Primary)   2. Need for hepatitis B screening test  - Hepatitis B Surface AntiBODY  3. Need for hepatitis A vaccination   -USPSTF grade A and B recommendations reviewed with patient; age-appropriate recommendations, preventive care, screening tests, etc discussed and encouraged; healthy living encouraged; see AVS for patient education given to patient -Discussed importance of 150 minutes of physical activity weekly, eat two servings of fish weekly, eat one serving of tree nuts ( cashews, pistachios, pecans, almonds.SABRA) every other day, eat 6 servings of fruit/vegetables daily and drink plenty of water and avoid sweet beverages.   -Reviewed Health Maintenance: Yes.

## 2024-02-07 NOTE — Telephone Encounter (Signed)
 Pt brought them in and will abstract.

## 2024-02-07 NOTE — Patient Instructions (Addendum)
 Please get Hepatitis A vaccine through health department of the county you live in or also call and ask pharmacy who has it.

## 2024-02-08 ENCOUNTER — Encounter: Payer: Self-pay | Admitting: Family Medicine

## 2024-02-08 ENCOUNTER — Ambulatory Visit: Payer: Self-pay | Admitting: Family Medicine

## 2024-02-08 LAB — HEPATITIS B SURFACE ANTIBODY,QUALITATIVE: Hep B Surface Ab, Qual: REACTIVE

## 2024-05-30 ENCOUNTER — Encounter: Payer: Self-pay | Admitting: Family Medicine

## 2024-05-31 ENCOUNTER — Other Ambulatory Visit: Payer: Self-pay

## 2024-05-31 DIAGNOSIS — R232 Flushing: Secondary | ICD-10-CM

## 2024-06-17 ENCOUNTER — Ambulatory Visit: Admitting: Family Medicine

## 2024-06-17 ENCOUNTER — Encounter: Payer: Self-pay | Admitting: Family Medicine

## 2024-06-17 VITALS — BP 130/78 | HR 84 | Resp 16 | Ht 64.0 in | Wt 155.6 lb

## 2024-06-17 DIAGNOSIS — H6123 Impacted cerumen, bilateral: Secondary | ICD-10-CM

## 2024-06-17 DIAGNOSIS — E049 Nontoxic goiter, unspecified: Secondary | ICD-10-CM

## 2024-06-17 DIAGNOSIS — E538 Deficiency of other specified B group vitamins: Secondary | ICD-10-CM

## 2024-06-17 DIAGNOSIS — E559 Vitamin D deficiency, unspecified: Secondary | ICD-10-CM

## 2024-06-17 DIAGNOSIS — Z90710 Acquired absence of both cervix and uterus: Secondary | ICD-10-CM

## 2024-06-17 DIAGNOSIS — D500 Iron deficiency anemia secondary to blood loss (chronic): Secondary | ICD-10-CM

## 2024-06-17 DIAGNOSIS — I10 Essential (primary) hypertension: Secondary | ICD-10-CM

## 2024-06-17 NOTE — Progress Notes (Signed)
 Name: Nicole Roman   MRN: [id number]    DOB: 1975-12-14   Date:06/17/2024       Progress Note  Subjective  Chief Complaint  Chief Complaint  Patient presents with   Medical Management of Chronic Issues   Discussed the use of AI scribe software for clinical note transcription with the patient, who gave verbal consent to proceed.  History of Present Illness Nicole Roman is a 48 year old female with iron  deficiency anemia and hypertension who presents for follow-up and lab review.  She has a history of iron  deficiency anemia and previously experienced heavy menstrual bleeding. She underwent a laparoscopic hysterectomy in 2023. Her last iron  storage level was 18. No pica or unusual cravings are present. She has not completed a capsule endoscopy following an EGD and colonoscopy in March, which was performed due to a family history of colon cancer.  She has a history of vitamin B12 deficiency and is currently taking a sublingual B12 supplement once a week. Her last B12 level was checked in November 2024, but she does not recall the results. She had previously stopped the supplement when her levels were high.  She has a history of hypertension, currently managed with amlodipine provided by her employer's clinic. She reports no chest pain or palpitations. She reports engaging in physical activity and avoiding salt.  She has a history of a goiter, with a biopsy performed in 2013 and an ultrasound in 2019, released by Endo. No difficulty swallowing.  She reports a history of breast cysts, with the last mammogram in June showing a benign cyst.  She recently had a lab test at her employee clinic, which indicated bacterial vaginosis (BV). She did not start treatment as the symptoms resolved on her own.  She experiences ear discomfort due to wax buildup, which has been a recurring issue. She has previously had her ears cleaned by medical staff.    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 Curtiss with [facility] in [place]   COLONOSCOPY WITH PROPOFOL  N/A 01/15/2020   Procedure: COLONOSCOPY WITH PROPOFOL ;  Surgeon: Janalyn Keene NOVAK, MD;  Location: [facility] ENDOSCOPY;  Service: Gastroenterology;  Laterality: N/A;   COLONOSCOPY WITH PROPOFOL  N/A 10/10/2023   Procedure: COLONOSCOPY WITH PROPOFOL ;  Surgeon: Jinny Carmine, MD;  Location: [facility] ENDOSCOPY;  Service: Endoscopy;  Laterality: N/A;   ESOPHAGOGASTRODUODENOSCOPY (EGD) WITH PROPOFOL  N/A 10/10/2023   Procedure: ESOPHAGOGASTRODUODENOSCOPY (EGD) WITH PROPOFOL ;  Surgeon: Jinny Carmine, MD;  Location: [facility] ENDOSCOPY;  Service: Endoscopy;  Laterality: N/A;   LAPAROSCOPIC VAGINAL HYSTERECTOMY WITH SALPINGECTOMY Bilateral 06/06/2022   Procedure: LAPAROSCOPIC ASSISTED VAGINAL HYSTERECTOMY WITH SALPINGECTOMY;  Surgeon: Connell Davies, MD;  Location: [facility] ORS;  Service: Gynecology;  Laterality: Bilateral;   LEEP  2002   tummy tuck  07/30/2017   Dr. Curtiss with [facility] in [facility]    Family History  Problem Relation Age of Onset   Cancer Mother  colon   Hypertension Mother    Diabetes Father    Breast cancer Neg Hx     Social History   Tobacco Use   Smoking status: Never   Smokeless tobacco: Never  Substance Use Topics   Alcohol use: Yes    Comment: occasionally     Current Outpatient Medications:    amLODipine (NORVASC) 5 MG tablet, Take 5 mg by mouth daily., Disp: , Rfl:     cyanocobalamin  (VITAMIN B12) 1000 MCG/ML injection, Inject 1 mL (1,000 mcg total) into the muscle every 30 (thirty) days., Disp: 1 mL, Rfl: 11  Allergies  Allergen Reactions   Diflucan  [Fluconazole ] Rash   Tetanus Toxoid-Containing Vaccines Rash    I personally reviewed active problem list, medication list, allergies, family history with the patient/caregiver today.   ROS  Ten systems reviewed and is negative except as mentioned in HPI    Objective Physical Exam CONSTITUTIONAL: Patient appears well-developed and well-nourished. No distress. HEENT: Head atraumatic, normocephalic, neck supple. Cerumen impaction causing discomfort bilaterally. CARDIOVASCULAR: Normal rate, regular rhythm and normal heart sounds. No murmur heard. No BLE edema. PULMONARY: Effort normal and breath sounds normal. No respiratory distress. ABDOMINAL: There is no tenderness or distention. MUSCULOSKELETAL: Normal gait. Without gross motor or sensory deficit. PSYCHIATRIC: Patient has a normal mood and affect. Behavior is normal. Judgment and thought content normal.  Vitals:   06/17/24 1333  BP: 130/78  Pulse: 84  Resp: 16  Weight: 155 lb 9.6 oz (70.6 kg)  Height: 5' 4 (1.626 m)    Body mass index is 26.71 kg/m.   PHQ2/9:    06/17/2024    1:31 PM 02/07/2024   10:28 AM 12/13/2023    2:16 PM 06/05/2023    3:46 PM 12/06/2022   10:03 AM  Depression screen PHQ 2/9  Decreased Interest 0 0 0 0 0  Down, Depressed, Hopeless 0 0 0 0 0  PHQ - 2 Score 0 0 0 0 0  Altered sleeping    0 0  Tired, decreased energy    0 0  Change in appetite    0 0  Feeling bad or failure about yourself     0 0  Trouble concentrating    0 0  Moving slowly or fidgety/restless    0 0  Suicidal thoughts    0 0  PHQ-9 Score    0  0      Data saved with a previous flowsheet row definition    phq 9 is negative  Fall Risk:    06/17/2024    1:30 PM 02/07/2024   10:28 AM 12/13/2023    2:16 PM 06/05/2023    3:46 PM 08/16/2022     1:42 PM  Fall Risk   Falls in the past year? 0 0 0 0 0  Number falls in past yr: 0 0 0  0  Injury with Fall? 0  0  0   0   Risk for fall due to : No Fall Risks No Fall Risks No Fall Risks No Fall Risks No Fall Risks  Follow up Falls evaluation completed Falls evaluation completed Falls prevention discussed;Falls evaluation completed;Education provided Falls prevention discussed Falls prevention discussed     Data saved with a previous flowsheet row definition     Assessment & Plan Iron  deficiency anemia Chronic iron  deficiency anemia likely due to previous chronic blood loss. Persistent anemia despite hysterectomy in 2023. - Ordered CBC and iron  studies through [employer]. - Requested results  from employee clinic to be faxed to provider. - up to date with colonoscopy and EGD but did not have capsule endoscopy done - not willing at this time  Essential hypertension Hypertension well-controlled with amlodipine. Blood pressure stable at home. - Continue amlodipine as prescribed by cardiologist.  Vitamin B12 deficiency Managed with sublingual B12 supplementation. Last B12 level checked in November 2024, results unavailable. - Ordered B12 level through [facility]. - Continue sublingual B12 supplementation once a week.  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 No current symptoms of dysphagia or complications. - Ordered thyroid  function tests through [facility].  Impacted cerumen, bilateral Bilateral impacted cerumen causing sensation of clogged ears. Previous over-the-counter methods unsuccessful. - Performed ear irrigation to remove impacted cerumen.      Verbal consent given     Possible side effects discussed with patient     Ears were  lavaged with warm water and peroxide      Patient tolerated procedure well     No complications    General Health Maintenance Routine health maintenance discussed, including need for  regular screenings and follow-ups. - Scheduled annual physical in August.

## 2024-06-19 ENCOUNTER — Encounter: Payer: Self-pay | Admitting: Family Medicine

## 2024-06-19 LAB — LAB REPORT - SCANNED: EGFR: 110

## 2024-06-21 ENCOUNTER — Ambulatory Visit: Payer: Self-pay | Admitting: Family Medicine

## 2024-07-17 ENCOUNTER — Telehealth: Admitting: Physician Assistant

## 2024-07-17 DIAGNOSIS — B9689 Other specified bacterial agents as the cause of diseases classified elsewhere: Secondary | ICD-10-CM

## 2024-07-17 DIAGNOSIS — J019 Acute sinusitis, unspecified: Secondary | ICD-10-CM | POA: Diagnosis not present

## 2024-07-17 MED ORDER — AMOXICILLIN-POT CLAVULANATE 875-125 MG PO TABS: 1.0000 | ORAL_TABLET | Freq: Two times a day (BID) | ORAL | 0 refills | Status: AC

## 2024-07-17 NOTE — Progress Notes (Signed)
 " Virtual Visit Consent   [name], you are scheduled for a virtual visit with a [facility] provider today. Just as with appointments in the office, your consent must be obtained to participate. Your consent will be active for this visit and any virtual visit you may have with one of our providers in the next 365 days. If you have a MyChart account, a copy of this consent can be sent to you electronically.  As this is a virtual visit, video technology does not allow for your provider to perform a traditional examination. This may limit your provider's ability to fully assess your condition. If your provider identifies any concerns that need to be evaluated in person or the need to arrange testing (such as labs, EKG, etc.), we will make arrangements to do so. Although advances in technology are sophisticated, we cannot ensure that it will always work on either your end or our end. If the connection with a video visit is poor, the visit may have to be switched to a telephone visit. With either a video or telephone visit, we are not always able to ensure that we have a secure connection.  By engaging in this virtual visit, you consent to the provision of healthcare and authorize for your insurance to be billed (if applicable) for the services provided during this visit. Depending on your insurance coverage, you may receive a charge related to this service.  I need to obtain your verbal consent now. Are you willing to proceed with your visit today? Nicole Roman has provided verbal consent on 07/17/2024 for a virtual visit (video or telephone). Nicole Roman, [place]  Date: 07/17/2024 6:22 PM   Virtual Visit via Video Note   I, Nicole Roman, connected with  Darlean Warmoth  ([id number], 06-05-1976) on 07/17/2024 at  6:15 PM EST by a video-enabled telemedicine application and verified that I am speaking with the correct person using two identifiers.  Location: Patient: Virtual  Visit Location Patient: Home Provider: Virtual Visit Location Provider: Home Office   I discussed the limitations of evaluation and management by telemedicine and the availability of in person appointments. The patient expressed understanding and agreed to proceed.    History of Present Illness: Nicole Roman is a 48 y.o. who identifies as a female who was assigned female at birth, and is being seen today for possible sinusitis. Endorses symptoms starting 1.5 weeks ago with nasal congestion, sinus pressure/pain with some intermittent puffiness under her eyes. Some sore throat, cough without major chest congestion. Nasal discharge is now a thick yellow. Denies fevers, chills.  OTC -- Zyrtec, Flonase.  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  Resting comfortably at home.  Head is normocephalic, atraumatic.  No labored breathing.  Speech is clear and coherent with logical content.  Patient is alert and oriented at baseline.   Assessment and Plan: 1. Acute bacterial sinusitis (Primary) -  amoxicillin-clavulanate (AUGMENTIN) 875-125 MG tablet; Take 1 tablet by mouth 2 (two) times daily.  Dispense: 14 tablet; Refill: 0  Rx Augmentin.  Increase fluids.  Rest.  Saline nasal spray.  Probiotic.  Mucinex as directed.  Humidifier in bedroom. Continued Flonase and Zyrtec.  Call or return to clinic if symptoms are not  improving.   Follow Up Instructions: I discussed the assessment and treatment plan with the patient. The patient was provided an opportunity to ask questions and all were answered. The patient agreed with the plan and demonstrated an understanding of the instructions.  A copy of instructions were sent to the patient via MyChart unless otherwise noted below.   The patient was advised to call back or seek an in-person evaluation if the symptoms worsen or if the condition fails to improve as anticipated.    Nicole Velma Lunger, PA-C    [1]  Allergies Allergen Reactions   Diflucan  [Fluconazole ] Rash   Tetanus Toxoid-Containing Vaccines Rash  [2]  Current Outpatient Medications:    amoxicillin-clavulanate (AUGMENTIN) 875-125 MG tablet, Take 1 tablet by mouth 2 (two) times daily., Disp: 14 tablet, Rfl: 0   amLODipine (NORVASC) 5 MG tablet, Take 5 mg by mouth daily., Disp: , Rfl:    cyanocobalamin  (VITAMIN B12) 1000 MCG/ML injection, Inject 1 mL (1,000 mcg total) into the muscle every 30 (thirty) days., Disp: 1 mL, Rfl: 11  "

## 2024-07-17 NOTE — Patient Instructions (Signed)
 " Nicole Roman, thank you for joining [name], PA-C for today's virtual visit.  While this provider is not your primary care provider (PCP), if your PCP is located in our provider database this encounter information will be shared with them immediately following your visit.   A [facility] MyChart account gives you access to today's visit and all your visits, tests, and labs performed at [facility]  click here if you don't have a [facility] MyChart account or go to [email or web address]  Consent: (Patient) Nicole Roman provided verbal consent for this virtual visit at the beginning of the encounter.  Current Medications:  Current Outpatient Medications:    amoxicillin-clavulanate (AUGMENTIN) 875-125 MG tablet, Take 1 tablet by mouth 2 (two) times daily., Disp: 14 tablet, Rfl: 0   amLODipine (NORVASC) 5 MG tablet, Take 5 mg by mouth daily., Disp: , Rfl:    cyanocobalamin  (VITAMIN B12) 1000 MCG/ML injection, Inject 1 mL (1,000 mcg total) into the muscle every 30 (thirty) days., Disp: 1 mL, Rfl: 11   Medications ordered in this encounter:  Meds ordered this encounter  Medications   amoxicillin-clavulanate (AUGMENTIN) 875-125 MG tablet    Sig: Take 1 tablet by mouth 2 (two) times daily.    Dispense:  14 tablet    Refill:  0    Supervising Provider:   BLAISE ALEENE KIDD [[id number]]     *If you need refills on other medications prior to your next appointment, please contact your pharmacy*  Follow-Up: Call back or seek an in-person evaluation if the symptoms worsen or if the condition fails to improve as anticipated.  [facility] [phone]  Other Instructions Please take antibiotic as directed.  Increase fluid intake.  Use Saline nasal spray.  Take a daily multivitamin.  Place a humidifier in the bedroom.  Please call or return clinic if symptoms are not improving.  Sinusitis Sinusitis is redness, soreness, and swelling  (inflammation) of the paranasal sinuses. Paranasal sinuses are air pockets within the bones of your face (beneath the eyes, the middle of the forehead, or above the eyes). In healthy paranasal sinuses, mucus is able to drain out, and air is able to circulate through them by way of your nose. However, when your paranasal sinuses are inflamed, mucus and air can become trapped. This can allow bacteria and other germs to grow and cause infection. Sinusitis can develop quickly and last only a short time (acute) or continue over a long period (chronic). Sinusitis that lasts for more than 12 weeks is considered chronic.  CAUSES  Causes of sinusitis include: Allergies. Structural abnormalities, such as displacement of the cartilage that separates your nostrils (deviated septum), which can decrease the air flow through your nose and sinuses and affect sinus drainage. Functional abnormalities, such as when the small hairs (cilia) that line your sinuses and help remove mucus do not work properly or are not present. SYMPTOMS  Symptoms of acute and chronic sinusitis are the same. The primary symptoms are pain and pressure around the affected sinuses. Other symptoms include: Upper toothache. Earache. Headache. Bad breath. Decreased sense of smell and taste. A cough, which worsens when you are lying flat. Fatigue. Fever. Thick drainage from your nose, which often is green and may contain pus (purulent). Swelling and warmth over the affected sinuses. DIAGNOSIS  Your caregiver will perform a physical exam. During the exam, your caregiver may: Look in your nose for signs of abnormal growths in your nostrils (nasal polyps).  Tap over the affected sinus to check for signs of infection. View the inside of your sinuses (endoscopy) with a special imaging device with a light attached (endoscope), which is inserted into your sinuses. If your caregiver suspects that you have chronic sinusitis, one or more of the  following tests may be recommended: Allergy tests. Nasal culture A sample of mucus is taken from your nose and sent to a lab and screened for bacteria. Nasal cytology A sample of mucus is taken from your nose and examined by your caregiver to determine if your sinusitis is related to an allergy. TREATMENT  Most cases of acute sinusitis are related to a viral infection and will resolve on their own within 10 days. Sometimes medicines are prescribed to help relieve symptoms (pain medicine, decongestants, nasal steroid sprays, or saline sprays).  However, for sinusitis related to a bacterial infection, your caregiver will prescribe antibiotic medicines. These are medicines that will help kill the bacteria causing the infection.  Rarely, sinusitis is caused by a fungal infection. In theses cases, your caregiver will prescribe antifungal medicine. For some cases of chronic sinusitis, surgery is needed. Generally, these are cases in which sinusitis recurs more than 3 times per year, despite other treatments. HOME CARE INSTRUCTIONS  Drink plenty of water. Water helps thin the mucus so your sinuses can drain more easily. Use a humidifier. Inhale steam 3 to 4 times a day (for example, sit in the bathroom with the shower running). Apply a warm, moist washcloth to your face 3 to 4 times a day, or as directed by your caregiver. Use saline nasal sprays to help moisten and clean your sinuses. Take over-the-counter or prescription medicines for pain, discomfort, or fever only as directed by your caregiver. SEEK IMMEDIATE MEDICAL CARE IF: You have increasing pain or severe headaches. You have nausea, vomiting, or drowsiness. You have swelling around your face. You have vision problems. You have a stiff neck. You have difficulty breathing. MAKE SURE YOU:  Understand these instructions. Will watch your condition. Will get help right away if you are not doing well or get worse. Document Released: 07/04/2005  Document Revised: 09/26/2011 Document Reviewed: 07/19/2011 [facility] Patient Information 2014 [place], [place].    If you have been instructed to have an in-person evaluation today at a local Urgent Care facility, please use the link below. It will take you to a list of all of our available [facility], including address, phone number and hours of operation. Please do not delay care.  [facility]  If you or a family member do not have a primary care provider, use the link below to schedule a visit and establish care. When you choose a [facility] primary care physician or advanced practice provider, you gain a long-term partner in health. Find a Primary Care Provider  Learn more about [facility]'s in-office and virtual care options: [facility] - Get Care Now  "

## 2025-02-25 ENCOUNTER — Encounter: Admitting: Family Medicine

## 2025-06-23 ENCOUNTER — Ambulatory Visit: Admitting: Family Medicine
